# Patient Record
Sex: Female | Born: 1975 | Race: Asian | Hispanic: No | Marital: Married | Smoking: Never smoker
Health system: Southern US, Community
[De-identification: ages and names within clinical notes are randomized; demographics above are authoritative.]

## PROBLEM LIST (undated history)

## (undated) DIAGNOSIS — R112 Nausea with vomiting, unspecified: Secondary | ICD-10-CM

## (undated) DIAGNOSIS — Z9889 Other specified postprocedural states: Secondary | ICD-10-CM

## (undated) DIAGNOSIS — C50919 Malignant neoplasm of unspecified site of unspecified female breast: Secondary | ICD-10-CM

## (undated) HISTORY — PX: MASTECTOMY: SHX3

---

## 2018-11-09 ENCOUNTER — Encounter: Payer: Self-pay | Admitting: Hematology and Oncology

## 2018-11-09 ENCOUNTER — Telehealth: Payer: Self-pay | Admitting: Hematology and Oncology

## 2018-11-09 NOTE — Telephone Encounter (Signed)
New referral received from Dr. Newton Pigg for invasive ductal carcinoma. Pt was [place] in another country. Dr. Ulanda Edison was only able to provide the pt's path report, no mammograms provided. I spoke to the pt's mother in law and scheduled the pt to see Dr. Lindi Adie on 1/8 at 345pm. Aware to arrive 30 minutes early. Letter mailed.

## 2018-11-14 NOTE — Progress Notes (Signed)
[facility] CONSULT NOTE  Patient Care Team: Newton Pigg, MD as PCP - General (Obstetrics and Gynecology)  CHIEF COMPLAINTS/PURPOSE OF CONSULTATION:  Newly diagnosed breast cancer  HISTORY OF PRESENTING ILLNESS:  Ariana Luna 43 y.o. female is here because of recent diagnosis of invasive ductal carcinoma of the right breast. The patient was [place] and was referred by Dr. Ulanda Edison. Pathology from 09/18/18 from a right mastectomy shows the 5.3cm cancer to be stage 2, grade 2, HER2 negative, ER 60%, PR negative, Ki67 90%.   She presents to the clinic today with her mother and father in law, who served as interpreters for the visit. She reports the cancer was palpable 3-4 months ago and she went to the hospital for an exam. A biopsy and right mastectomy was performed and all 18 lymph nodes taken from the surgery were negative for cancer. The patient states that they found a small tumor in her left breast but there is no record of a biopsy so she will get a mammogram. In the past she has taken oral iron, but Dr. Ulanda Edison has checked her iron and it has normalized. She has no children. She says Dr. Ulanda Edison found a tumor in her uterus a few years ago and she had a hysterectomy and has only one ovary. She denies hot flashes. 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 Her surgical scar is healing well.   I reviewed her records extensively and collaborated the history with the patient.  SUMMARY OF ONCOLOGIC HISTORY:   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, estrogen receptor positive ([place])   09/30/2018 Surgery    Right mastectomy with axillary lymph node dissection in [place]: Grade 2 IDC, 5.3 cm, DCIS, margins negative, lymphovascular invasion present, 0/18 lymph nodes, ER 60%, PR 0%, HER-2 negative, Ki-67 90%, T3N0 stage II B    11/15/2018 Cancer Staging    Staging form: Breast, AJCC 8th Edition - Pathologic: Stage IIB  (pT3, pN0, cM0, G2, ER+, PR-, HER2-) - Signed by Nicholas Lose, MD on 11/15/2018    MEDICAL HISTORY:  Denies any prior medical problems SURGICAL HISTORY: Hysterectomy and one-sided oophorectomy Right mastectomy and axillary lymph node dissection  SOCIAL HISTORY: Denies any tobacco alcohol or recreational drug use FAMILY HISTORY: No family history of breast cancer  ALLERGIES:  has no allergies on file.  No known drug allergies  MEDICATIONS: Does not take any medications REVIEW OF SYSTEMS:   Constitutional: Denies fevers, chills or abnormal night sweats Eyes: Denies blurriness of vision, double vision or watery eyes Ears, nose, mouth, throat, and face: Denies mucositis or sore throat Respiratory: Denies cough, dyspnea or wheezes Cardiovascular: Denies palpitation, chest discomfort or lower extremity swelling Gastrointestinal:  Denies nausea, heartburn or change in bowel habits Skin: Denies abnormal skin rashes Lymphatics: Denies new lymphadenopathy or easy bruising Neurological:Denies numbness, tingling or new weaknesses Behavioral/Psych: Mood is stable, no new changes  Breast: Right mastectomy scar is well-healed All other systems were reviewed with the patient and are negative.  PHYSICAL EXAMINATION: ECOG PERFORMANCE STATUS: 1 - Symptomatic but completely ambulatory  Vitals:   11/15/18 1546  BP: 108/73  Pulse: 77  Resp: 16  Temp: 98.4 F (36.9 C)  SpO2: 100%   Filed Weights   11/15/18 1546  Weight: 123 lb 9.6 oz (56.1 kg)    GENERAL:alert, no distress and comfortable SKIN: skin color, texture, turgor are normal, no rashes or significant lesions EYES: normal, conjunctiva are  pink and non-injected, sclera clear OROPHARYNX:no exudate, no erythema and lips, buccal mucosa, and tongue normal  NECK: supple, thyroid normal size, non-tender, without nodularity LYMPH:  no palpable lymphadenopathy in the cervical, axillary or inguinal LUNGS: clear to auscultation and percussion  with normal breathing effort HEART: regular rate & rhythm and no murmurs and no lower extremity edema ABDOMEN:abdomen soft, non-tender and normal bowel sounds Musculoskeletal:no cyanosis of digits and no clubbing  PSYCH: alert & oriented x 3 with fluent speech NEURO: no focal motor/sensory deficits   LABORATORY DATA:  I have reviewed the data as listed No results found for: WBC, HGB, HCT, MCV, PLT No results found for: NA, K, CL, CO2  RADIOGRAPHIC STUDIES: I have personally reviewed the radiological reports and agreed with the findings in the report.  ASSESSMENT AND PLAN:  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, estrogen receptor positive ([place]) 09/30/2018:Right mastectomy with axillary lymph node dissection in [place]: Grade 2 IDC, 5.3 cm, DCIS, margins negative, lymphovascular invasion present, 0/18 lymph nodes, ER 60%, PR 0%, HER-2 negative, Ki-67 90%, T3N0 stage II B  Pathology and radiology counseling: Discussed with the patient, the details of pathology including the type of breast cancer,the clinical staging, the significance of ER, PR and HER-2/neu receptors and the implications for treatment. After reviewing the pathology in detail, we proceeded to discuss the different treatment options between surgery, radiation, chemotherapy, antiestrogen therapies.  Treatment plan: 1.  Oncotype DX testing to determine if she would benefit from chemo 2.  Adjuvant radiation therapy because of the tumor size being large 4.  Followed by adjuvant antiestrogen therapy with tamoxifen 20 mg daily x10 years  Left breast abnormality: This was noted in [place] but she did not have biopsy.  I would like to get another mammogram and ultrasound for further evaluate this.  Genetic counseling will be obtained. Return to clinic based upon Oncotype test results or at the end of radiation.  We will call the family with the results of Oncotype test. Patient's mother  acted as interpreter for further discussion.   All questions were answered. The patient knows to call the clinic with any problems, questions or concerns.   Nicholas Lose, MD 11/15/2018   I, Cloyde Reams Dorshimer, am acting as scribe for Nicholas Lose, MD.  I have reviewed the above documentation for accuracy and completeness, and I agree with the above.

## 2018-11-15 ENCOUNTER — Inpatient Hospital Stay: Payer: BLUE CROSS/BLUE SHIELD | Attending: Hematology and Oncology | Admitting: Hematology and Oncology

## 2018-11-15 VITALS — BP 108/73 | HR 77 | Temp 98.4°F | Resp 16 | Ht 61.0 in | Wt 123.6 lb

## 2018-11-15 DIAGNOSIS — Z9221 Personal history of antineoplastic chemotherapy: Secondary | ICD-10-CM | POA: Diagnosis not present

## 2018-11-15 DIAGNOSIS — Z923 Personal history of irradiation: Secondary | ICD-10-CM | POA: Insufficient documentation

## 2018-11-15 DIAGNOSIS — Z79810 Long term (current) use of selective estrogen receptor modulators (SERMs): Secondary | ICD-10-CM | POA: Insufficient documentation

## 2018-11-15 DIAGNOSIS — Z170 Estrogen receptor positive status [ER+]: Secondary | ICD-10-CM | POA: Insufficient documentation

## 2018-11-15 DIAGNOSIS — Z9011 Acquired absence of right breast and nipple: Secondary | ICD-10-CM | POA: Diagnosis not present

## 2018-11-15 DIAGNOSIS — C50011 Malignant neoplasm of nipple and areola, right female breast: Secondary | ICD-10-CM | POA: Insufficient documentation

## 2018-11-15 NOTE — Assessment & Plan Note (Signed)
09/30/2018:Right mastectomy with axillary lymph node dissection in [place]: Grade 2 IDC, 5.3 cm, DCIS, margins negative, lymphovascular invasion present, 0/18 lymph nodes, ER 60%, PR 0%, HER-2 negative, Ki-67 90%, T3N0 stage II B  Pathology and radiology counseling: Discussed with the patient, the details of pathology including the type of breast cancer,the clinical staging, the significance of ER, PR and HER-2/neu receptors and the implications for treatment. After reviewing the pathology in detail, we proceeded to discuss the different treatment options between surgery, radiation, chemotherapy, antiestrogen therapies.  Treatment plan: 1.  Oncotype DX testing to determine if she would benefit from chemo 2.  Adjuvant radiation therapy because of the tumor size being large 4.  Followed by adjuvant antiestrogen therapy with tamoxifen 20 mg daily x10 years  Left breast abnormality: This was noted in [place] but she did not have biopsy.  I would like to get another mammogram and ultrasound for further evaluate this.  Genetic counseling will be obtained. Return to clinic based upon Oncotype test results or at the end of radiation.  We will call the family with the results of Oncotype test. Patient's mother acted as interpreter for further discussion.

## 2018-11-16 ENCOUNTER — Other Ambulatory Visit: Payer: Self-pay | Admitting: Hematology and Oncology

## 2018-11-20 ENCOUNTER — Telehealth: Payer: Self-pay | Admitting: *Deleted

## 2018-11-20 NOTE — Telephone Encounter (Signed)
Received order for oncotype testing per Dr. Lindi Adie. Requisition faxed to pathology. Received by Maudie Mercury

## 2018-11-28 ENCOUNTER — Other Ambulatory Visit: Payer: Self-pay | Admitting: Hematology and Oncology

## 2018-11-28 ENCOUNTER — Ambulatory Visit
Admission: RE | Admit: 2018-11-28 | Discharge: 2018-11-28 | Disposition: A | Discharge status: Home / Self Care | Payer: BLUE CROSS/BLUE SHIELD | Source: Ambulatory Visit | Attending: Hematology and Oncology | Admitting: Hematology and Oncology

## 2018-11-28 DIAGNOSIS — C50011 Malignant neoplasm of nipple and areola, right female breast: Secondary | ICD-10-CM

## 2018-11-28 DIAGNOSIS — Z170 Estrogen receptor positive status [ER+]: Principal | ICD-10-CM

## 2018-11-28 DIAGNOSIS — N6320 Unspecified lump in the left breast, unspecified quadrant: Secondary | ICD-10-CM

## 2018-11-28 IMAGING — MG DIGITAL DIAGNOSTIC UNILATERAL LEFT MAMMOGRAM WITH TOMO AND CAD
4 series · 4 of 12 positions shown · non-contrast
Comparison: Previous exam(s).

CLINICAL DATA: Patient with recent diagnosis of right breast cancer
status post mastectomy. Additionally, patient had multiple nodules
within the left breast, for short-term follow-up evaluation.
Patient's initial evaluation and surgery was done outside of the
country.

EXAM:
DIGITAL DIAGNOSTIC LEFT MAMMOGRAM WITH CAD AND TOMO
ULTRASOUND LEFT BREAST

[L CC synth-2D]
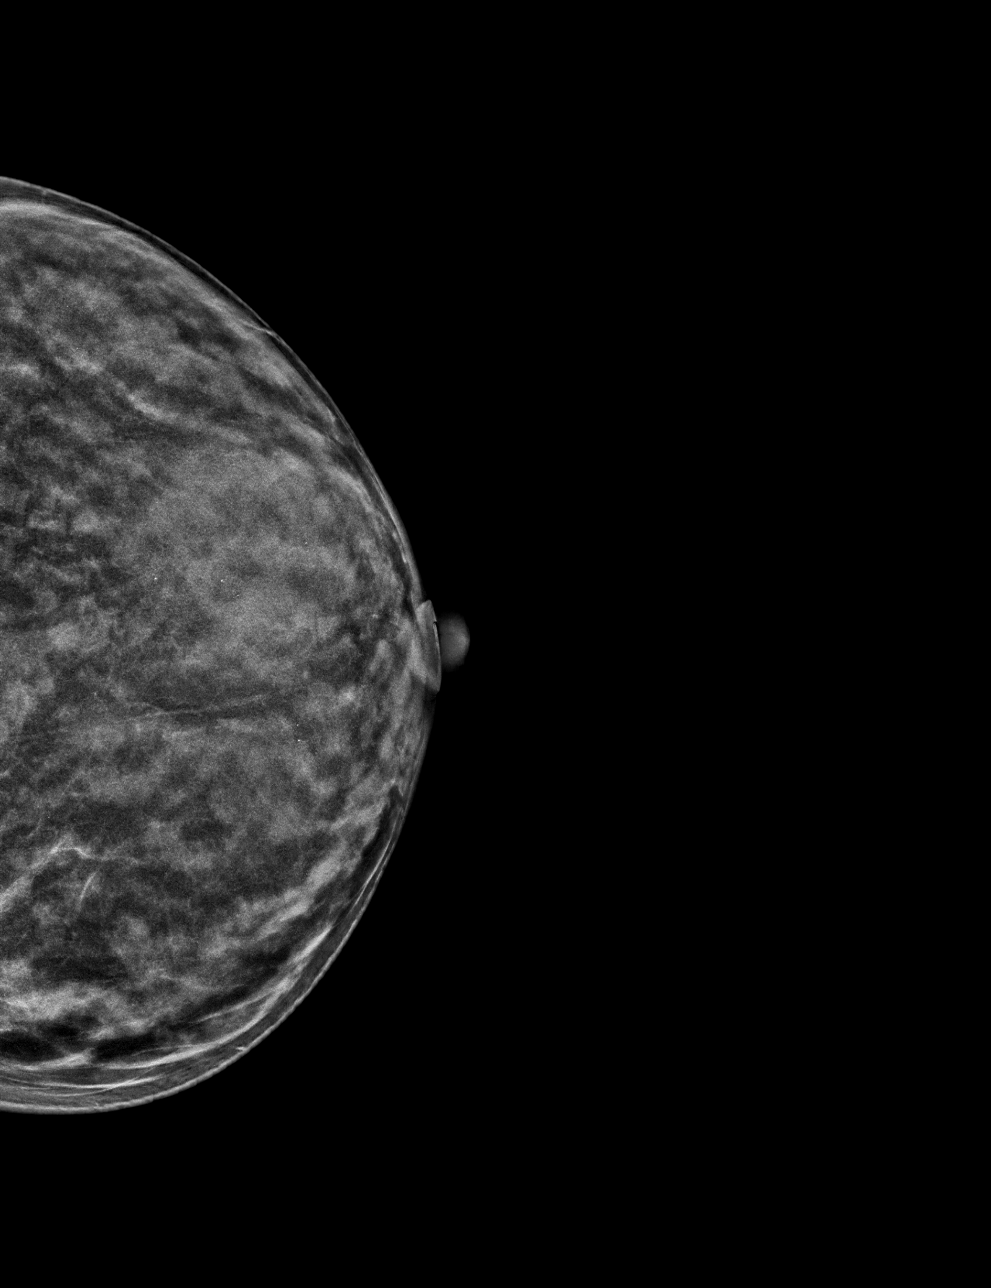

[L MLO synth-2D]
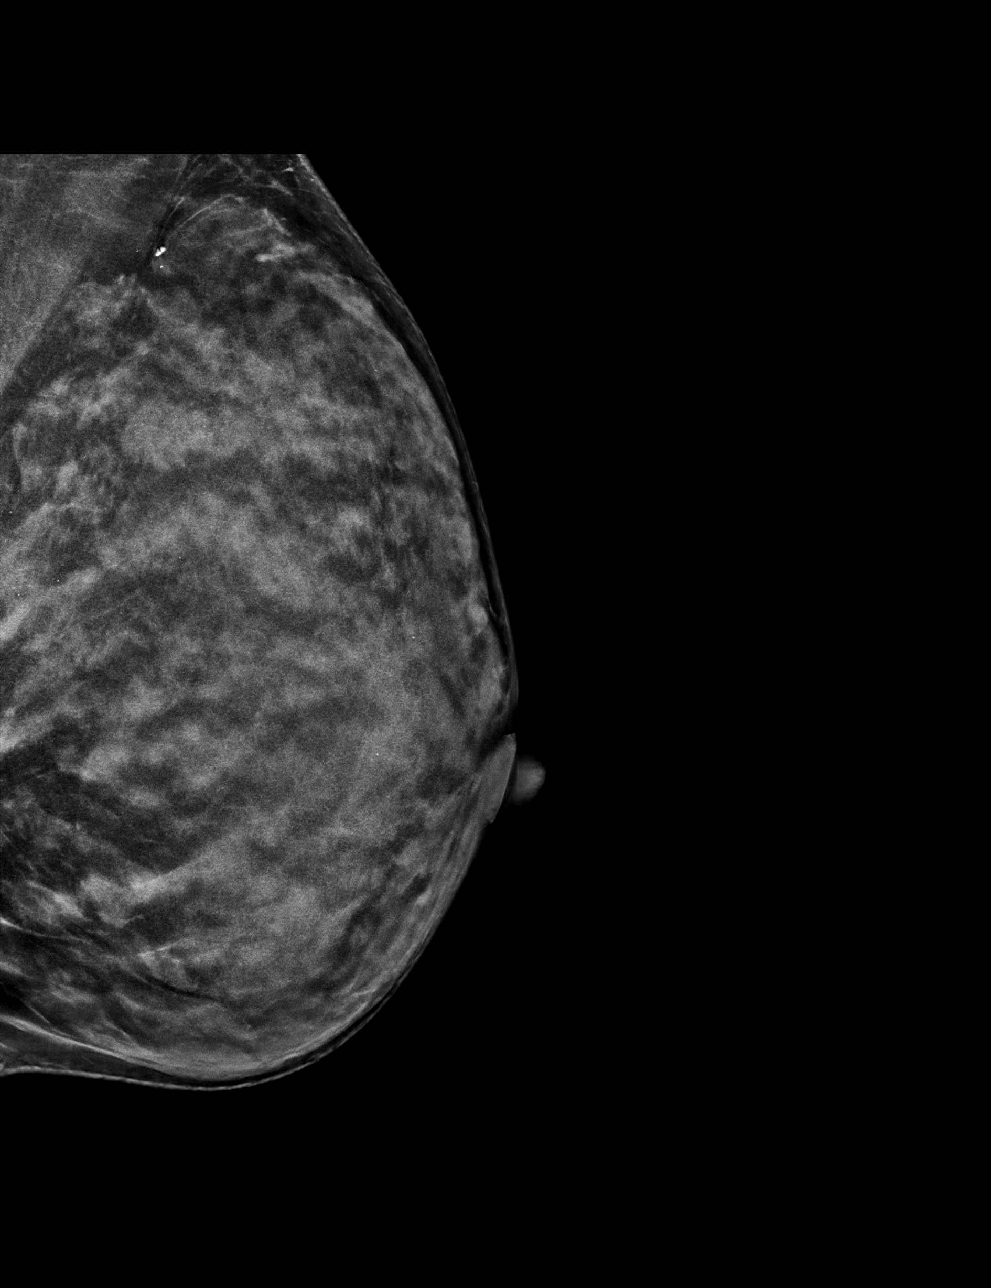

[L CC tomo · tomo slice 25/50.0]
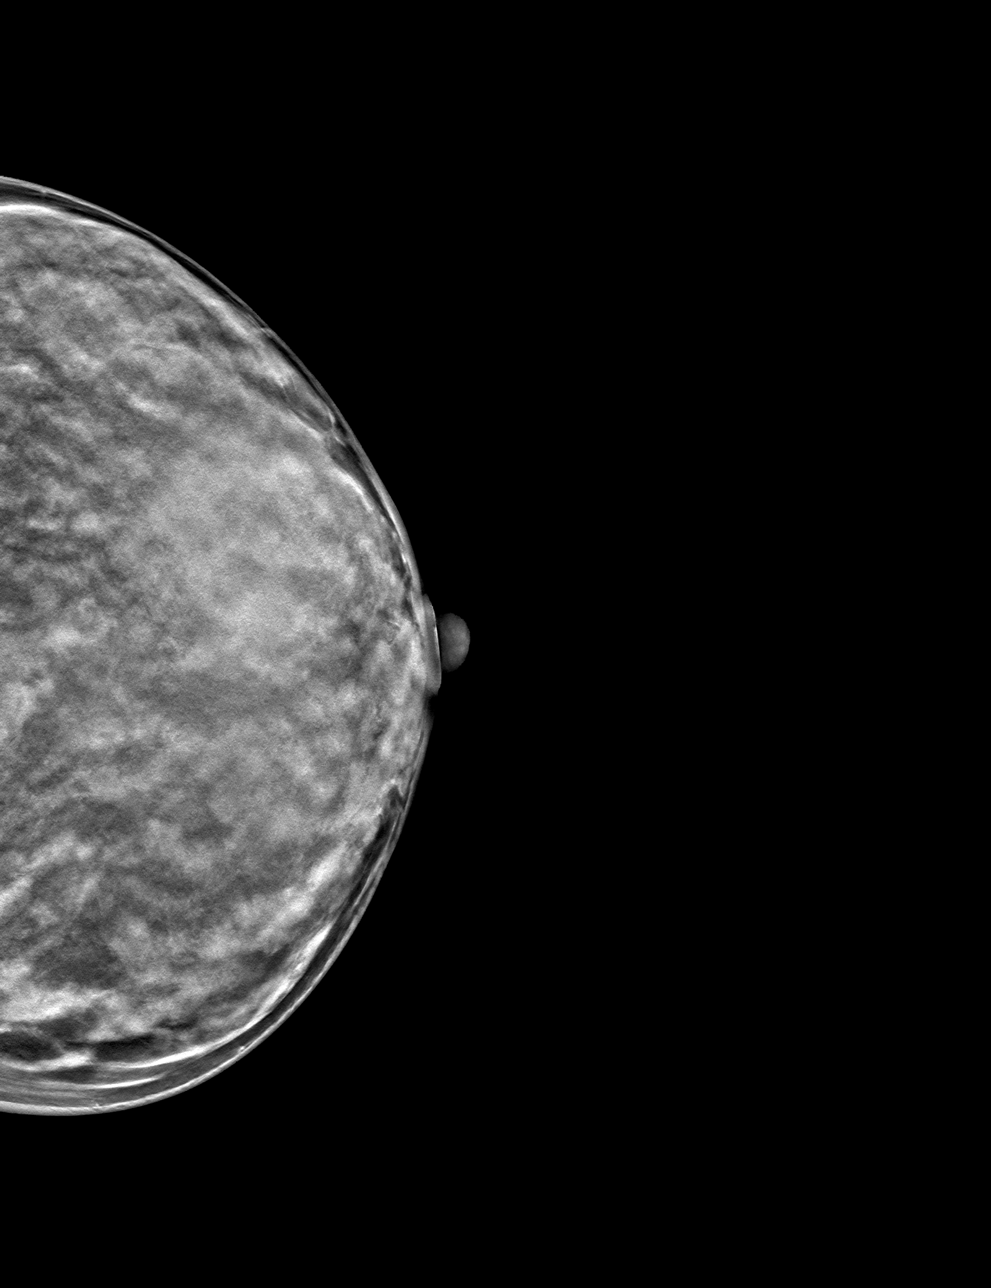

[L MLO tomo · tomo slice 28/55.0]
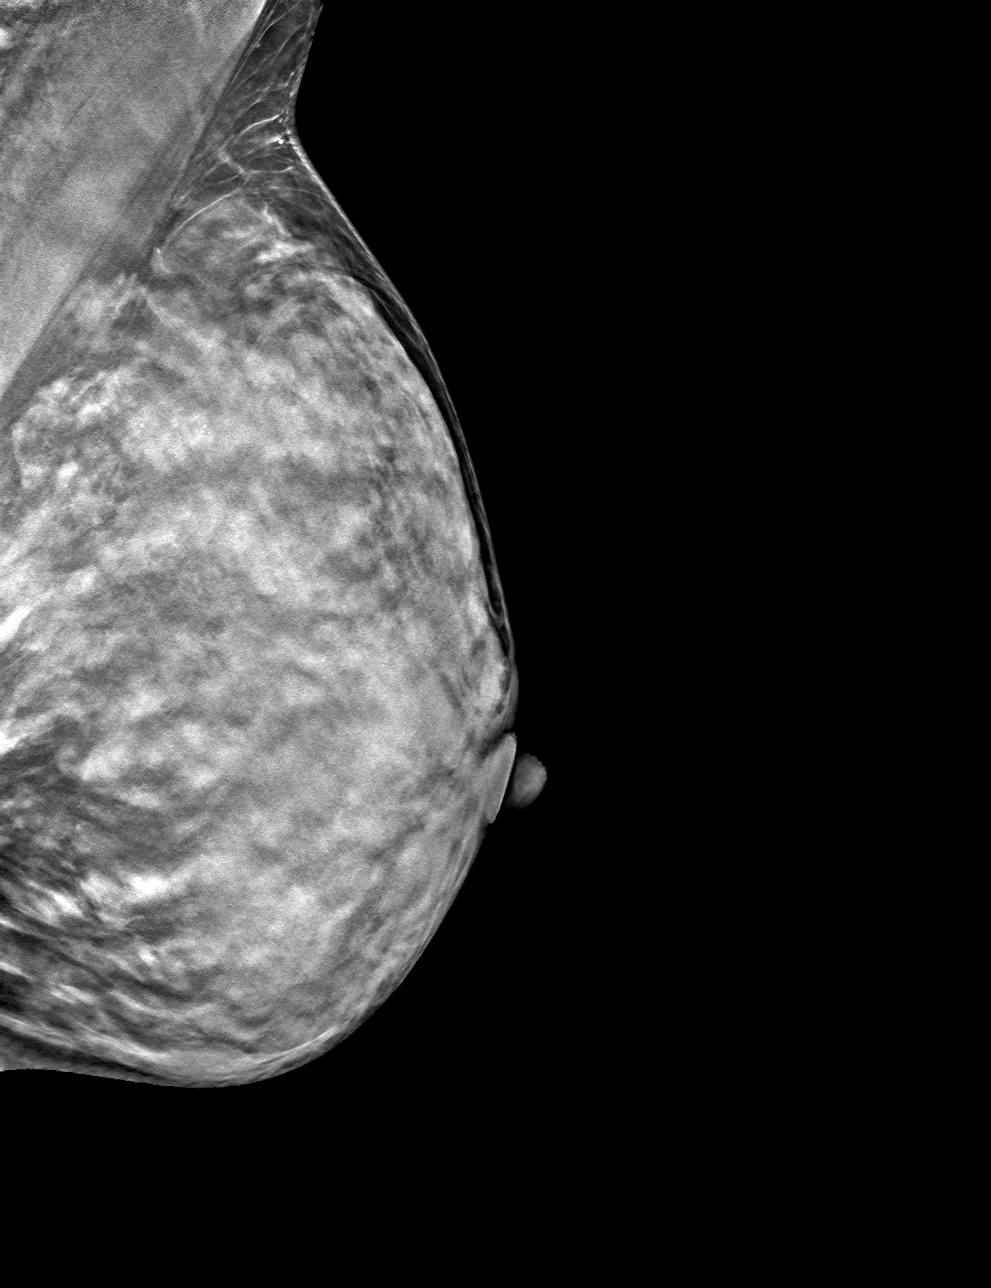

[4 of 12 positions shown; findings below may reference images not displayed]

ACR Breast Density Category d: The breast tissue is extremely dense,
which lowers the sensitivity of mammography.
FINDINGS: No concerning masses, calcifications or distortion identified within
the left breast.

Mammographic images were processed with CAD.

On physical exam, I palpate no discrete mass within the upper inner
left breast.

Targeted ultrasound is performed, showing a 0.9 x 0.5 x 0.7 cm
lobular hypoechoic mass left breast 10 o'clock position 7 cm from
nipple, slightly increased in size from recent prior ultrasound
where it measured 0.7 x 0.7 x 0.4 cm.

Within the left breast 10 o'clock position 2 cm from nipple there is
a 0.7 x 0.9 x 0.5 cm cluster of cysts.

Within the left breast 3 o'clock position 6 cm from nipple there is
a 1.1 x 0.3 x 0.8 cm oval circumscribed hypoechoic mass.
IMPRESSION: 1. Slight interval increase in size of indeterminate left breast
mass 10 o'clock position 7 cm from the nipple.
2. Probably benign left breast mass 10 o'clock position 2 cm from
nipple, favored to represent a cluster of cysts.
3. Prior benign left breast mass 3 o'clock position, favored to
represent a fibroadenoma.

RECOMMENDATION:
1. Ultrasound-guided core needle biopsy left breast mass 10 o'clock
position 7 cm from nipple.
2. If this demonstrates benign pathology, recommend follow-up left
breast mammogram and ultrasound in 6 months to ensure stability of
additional probably benign masses 10 o'clock position and 3 o'clock
position.
3. Consider breast MRI given personal history of breast cancer.

I have discussed the findings and recommendations with the patient.
Results were also provided in writing at the conclusion of the
visit. If applicable, a reminder letter will be sent to the patient
regarding the next appointment.

BI-RADS CATEGORY  4: Suspicious.

## 2018-11-28 IMAGING — US ULTRASOUND LEFT BREAST LIMITED
1 series · 13 of 16 positions shown · non-contrast
Comparison: Previous exam(s).

CLINICAL DATA: Patient with recent diagnosis of right breast cancer
status post mastectomy. Additionally, patient had multiple nodules
within the left breast, for short-term follow-up evaluation.
Patient's initial evaluation and surgery was done outside of the
country.

EXAM:
DIGITAL DIAGNOSTIC LEFT MAMMOGRAM WITH CAD AND TOMO
ULTRASOUND LEFT BREAST

[Series 1: ultrasound left breast limited · 0.05mm/px · 13 of 16 slices shown]
[im 1/16]
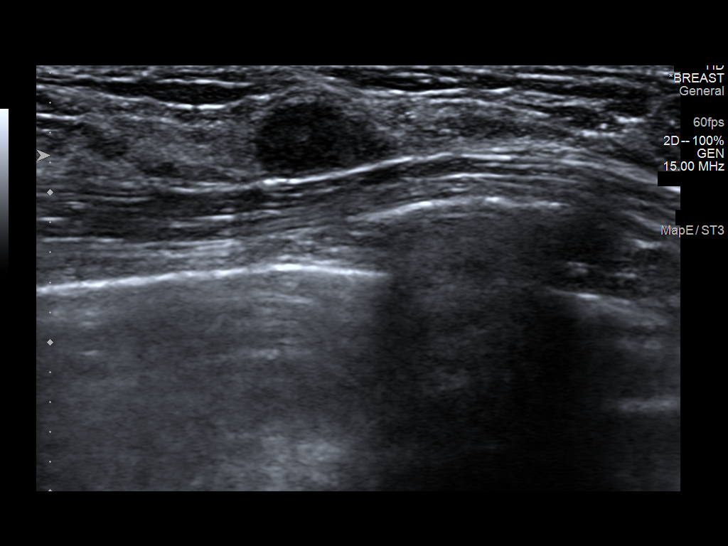
[im 2/16]
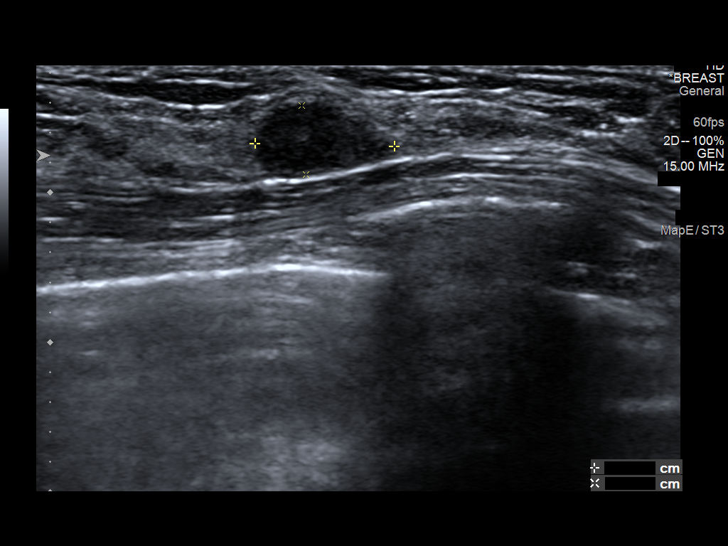
[im 4/16]
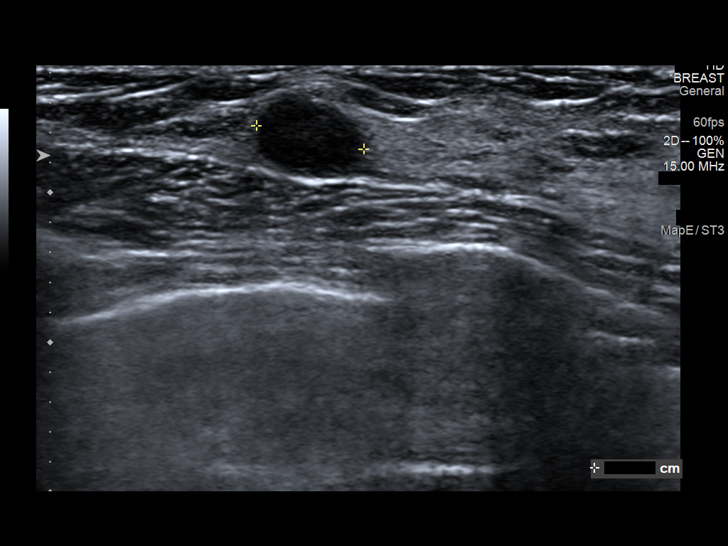
[im 5/16]
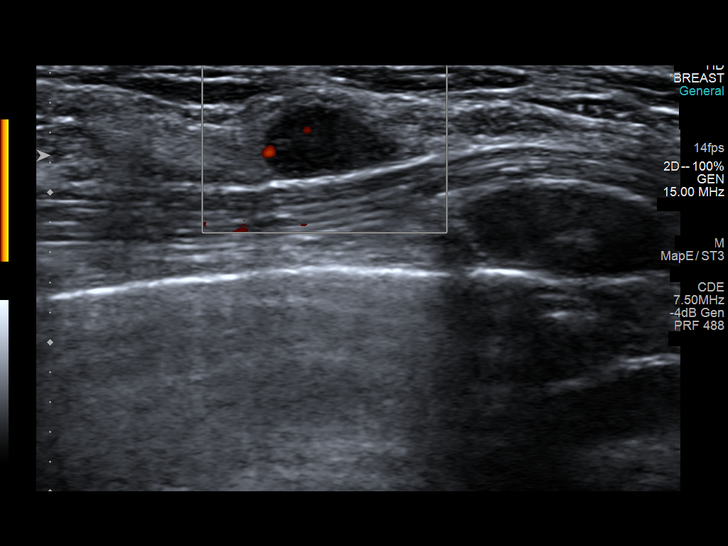
[im 6/16]
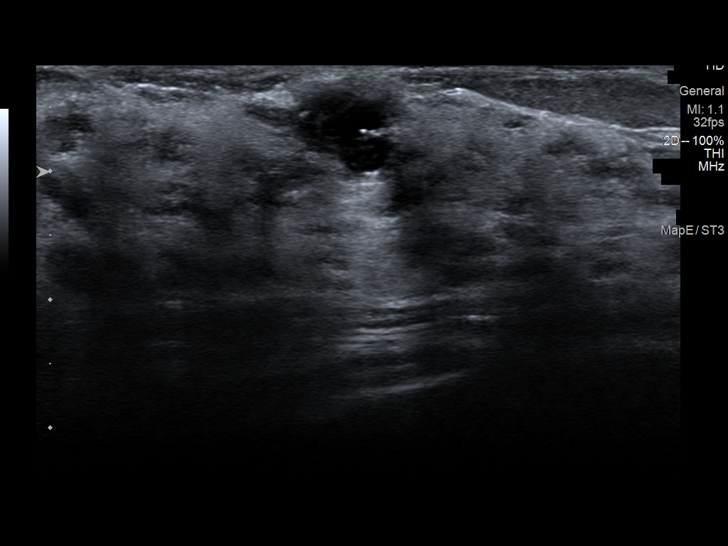
[im 7/16]
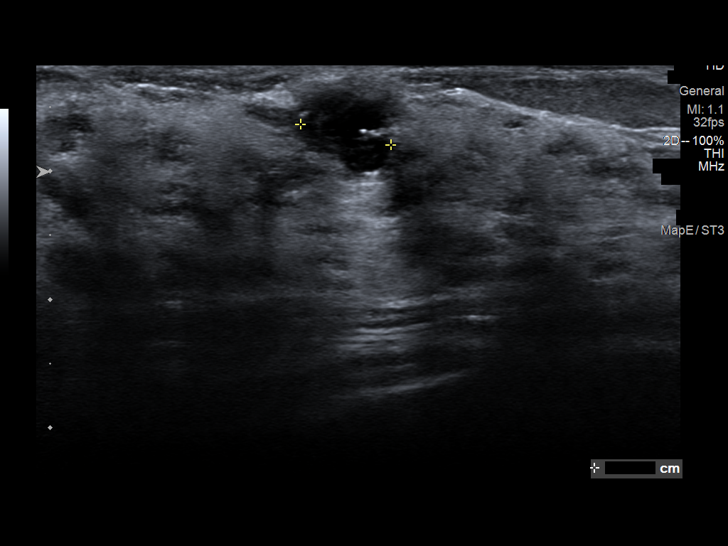
[im 9/16]
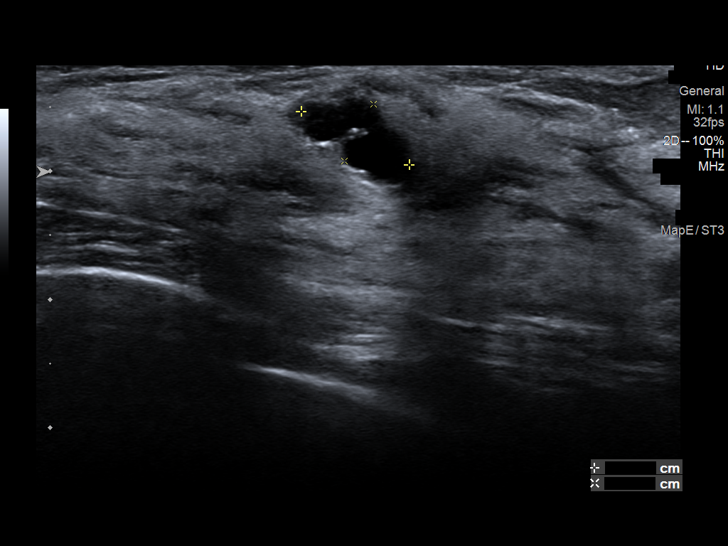
[im 10/16]
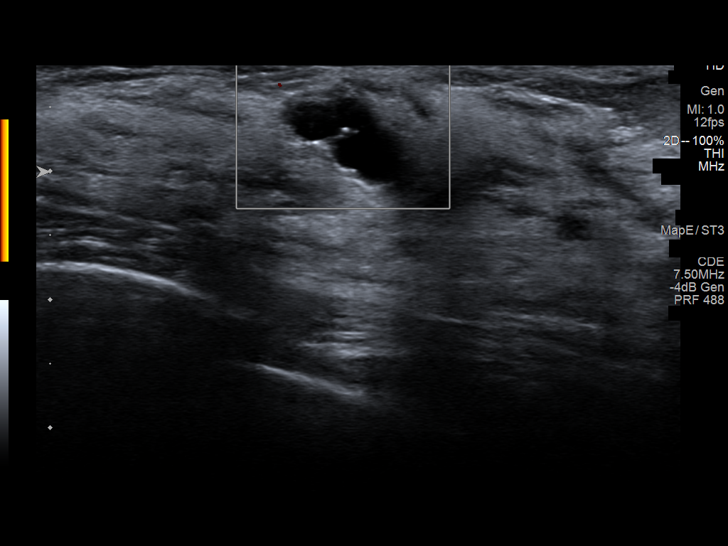
[im 11/16]
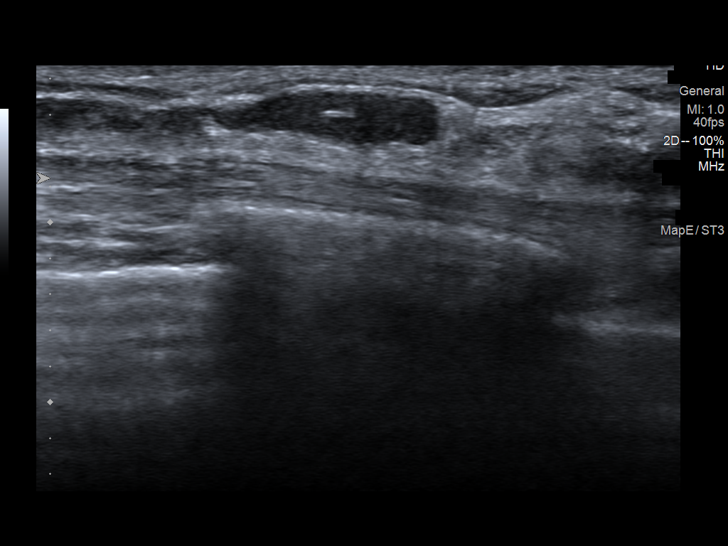
[im 12/16]
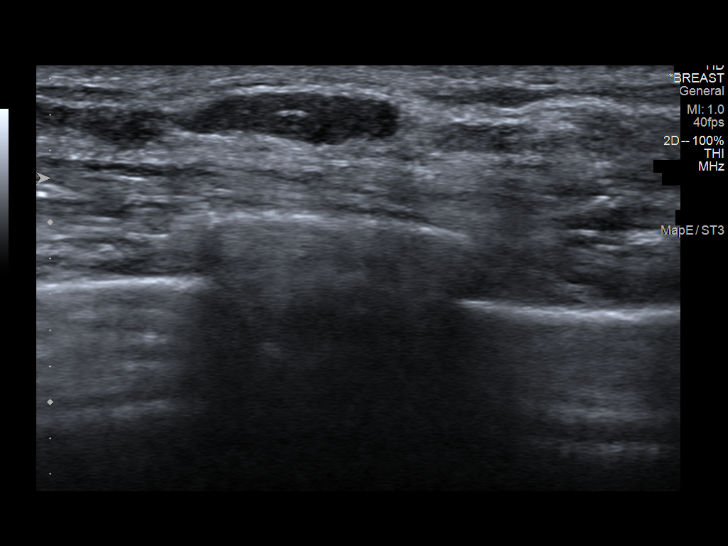
[im 13/16]
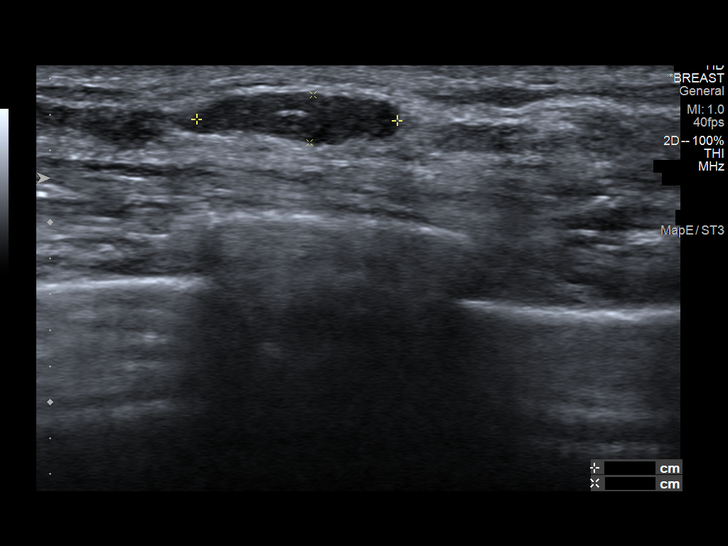
[im 15/16]
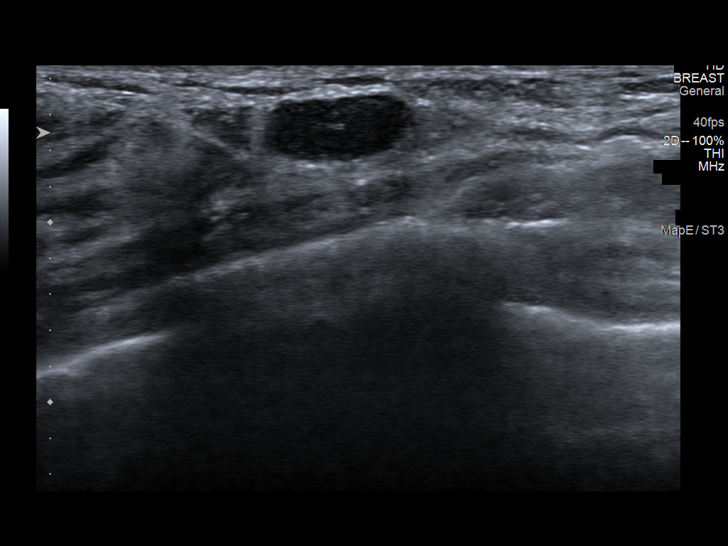
[im 16/16]
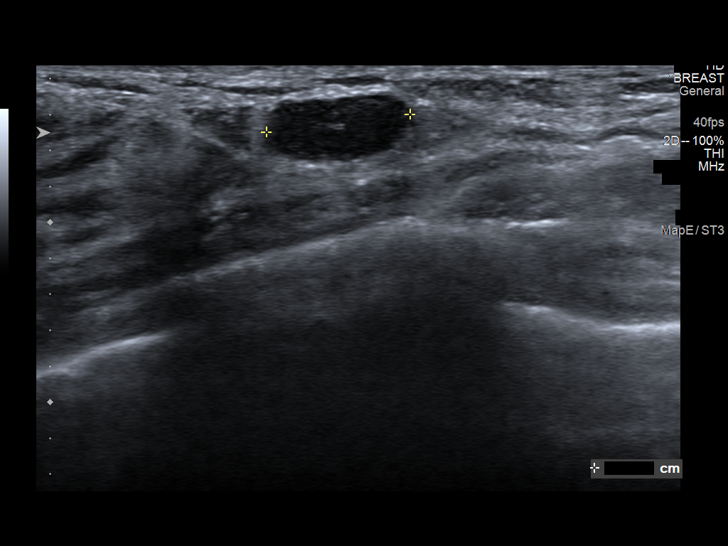

[13 of 16 positions shown; findings below may reference images not displayed]

ACR Breast Density Category d: The breast tissue is extremely dense,
which lowers the sensitivity of mammography.
FINDINGS: No concerning masses, calcifications or distortion identified within
the left breast.

Mammographic images were processed with CAD.

On physical exam, I palpate no discrete mass within the upper inner
left breast.

Targeted ultrasound is performed, showing a 0.9 x 0.5 x 0.7 cm
lobular hypoechoic mass left breast 10 o'clock position 7 cm from
nipple, slightly increased in size from recent prior ultrasound
where it measured 0.7 x 0.7 x 0.4 cm.

Within the left breast 10 o'clock position 2 cm from nipple there is
a 0.7 x 0.9 x 0.5 cm cluster of cysts.

Within the left breast 3 o'clock position 6 cm from nipple there is
a 1.1 x 0.3 x 0.8 cm oval circumscribed hypoechoic mass.
IMPRESSION: 1. Slight interval increase in size of indeterminate left breast
mass 10 o'clock position 7 cm from the nipple.
2. Probably benign left breast mass 10 o'clock position 2 cm from
nipple, favored to represent a cluster of cysts.
3. Prior benign left breast mass 3 o'clock position, favored to
represent a fibroadenoma.

RECOMMENDATION:
1. Ultrasound-guided core needle biopsy left breast mass 10 o'clock
position 7 cm from nipple.
2. If this demonstrates benign pathology, recommend follow-up left
breast mammogram and ultrasound in 6 months to ensure stability of
additional probably benign masses 10 o'clock position and 3 o'clock
position.
3. Consider breast MRI given personal history of breast cancer.

I have discussed the findings and recommendations with the patient.
Results were also provided in writing at the conclusion of the
visit. If applicable, a reminder letter will be sent to the patient
regarding the next appointment.

BI-RADS CATEGORY  4: Suspicious.

## 2018-11-29 ENCOUNTER — Inpatient Hospital Stay (HOSPITAL_BASED_OUTPATIENT_CLINIC_OR_DEPARTMENT_OTHER): Payer: BLUE CROSS/BLUE SHIELD | Admitting: Genetic Counselor

## 2018-11-29 ENCOUNTER — Inpatient Hospital Stay: Payer: BLUE CROSS/BLUE SHIELD

## 2018-11-29 ENCOUNTER — Encounter: Payer: Self-pay | Admitting: Genetic Counselor

## 2018-11-29 DIAGNOSIS — Z170 Estrogen receptor positive status [ER+]: Secondary | ICD-10-CM

## 2018-11-29 DIAGNOSIS — C50011 Malignant neoplasm of nipple and areola, right female breast: Secondary | ICD-10-CM | POA: Diagnosis not present

## 2018-11-29 NOTE — Progress Notes (Signed)
REFERRING PROVIDER: Nicholas Lose, MD [place], [place] [address]  PRIMARY PROVIDER:  Newton Pigg, MD  PRIMARY REASON FOR VISIT:  1. 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, estrogen receptor positive ([place])      HISTORY OF PRESENT ILLNESS:   Ariana Luna, a 43 y.o. female, was seen for a [place] cancer genetics consultation at the request of Dr. Lindi Adie due to a personal history of cancer. Telephonic Interpreter [phone], as well as the patient's mother-in-law, were available to translate.  Ariana Luna presents to clinic today to discuss the possibility of a hereditary predisposition to cancer, genetic testing, and to further clarify her future cancer risks, as well as potential cancer risks for family members.   In November 2019, at the age of 73, Ariana Luna was diagnosed with cancer of the left breast. This was treated with unilateral mastectomy, which was performed in her hometown in [place].     CANCER HISTORY:    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, estrogen receptor positive ([place])   09/30/2018 Surgery    Right mastectomy with axillary lymph node dissection in [place]: Grade 2 IDC, 5.3 cm, DCIS, margins negative, lymphovascular invasion present, 0/18 lymph nodes, ER 60%, PR 0%, HER-2 negative, Ki-67 90%, T3N0 stage II B    11/15/2018 Cancer Staging    Staging form: Breast, AJCC 8th Edition - Pathologic: Stage IIB (pT3, pN0, cM0, G2, ER+, PR-, HER2-) - Signed by Nicholas Lose, MD on 11/15/2018      HORMONAL RISK FACTORS:  Menarche was at age 31.  First live birth at age N/A.  OCP use for approximately 0 years.  Ovaries intact: one ovary is intact, the other was removed.  Hysterectomy: yes.  Menopausal status: premenopausal.  HRT use: 0 years. Colonoscopy: no; not examined. Mammogram within the last year: yes. Number of breast biopsies: patient has had a  unilateral mastectomy Up to date with pelvic exams:  yes. Any excessive radiation exposure in the past:  no  No past medical history on file.   Social History   Socioeconomic History  . Marital status: Married    Spouse name: Not on file  . Number of children: Not on file  . Years of education: Not on file  . Highest education level: Not on file  Occupational History  . Not on file  Social Needs  . Financial resource strain: Not on file  . Food insecurity:    Worry: Not on file    Inability: Not on file  . Transportation needs:    Medical: Not on file    Non-medical: Not on file  Tobacco Use  . Smoking status: Not on file  Substance and Sexual Activity  . Alcohol use: Not on file  . Drug use: Not on file  . Sexual activity: Not on file  Lifestyle  . Physical activity:    Days per week: Not on file    Minutes per session: Not on file  . Stress: Not on file  Relationships  . Social connections:    Talks on phone: Not on file    Gets together: Not on file    Attends religious service: Not on file    Active member of club or organization: Not on file    Attends meetings of clubs or organizations: Not on file    Relationship status: Not on file  Other Topics Concern  . Not on file  Social History Narrative  . Not on file     FAMILY HISTORY:  We obtained a detailed, 4-generation family history.  Significant diagnoses are listed below: No family history on file.   The patient reports that there is no family history of cancer.  [place] and went to town to go to school.  She is not close with extended family members.  The patient does not have children.  She has a brother and sister who are cancer free.  Her father is deceased from unknown reasons and her mother is living.  The patient's mother was one of 5 sisters, she had one brother.  There is no reported family history of cancer.  The patient's father had several siblings.  The  patient was not close with the family, but is not aware of cancer on the paternal side of the family.  Ms. Schlink is unaware of previous family history of genetic testing for hereditary cancer risks. Patient's maternal ancestors are of [place] descent, and paternal ancestors are of [place] descent. There is no reported Ashkenazi Jewish ancestry. There is no known consanguinity.  GENETIC COUNSELING ASSESSMENT: Ariana Luna is a 43 y.o. female with a personal history of early onset breast cancer which is somewhat suggestive of a hereditary cancer syndrome and predisposition to cancer. We, therefore, discussed and recommended the following at today's visit.   DISCUSSION: We discussed that about 5-10% of breast cancer is hereditary with most cases due to BRCA mutations.  There are other genes that can increase the risk for breast cancer specifically, or cancer in general.  Based on her family history, the likelihood that we will find something is low, however, based on her personal history and age, we should look.  If we found a hereditary mutation it would have consequences for how we would follow her in the future and the risk for her family members.  We reviewed the characteristics, features and inheritance patterns of hereditary cancer syndromes. We also discussed genetic testing, including the appropriate family members to test, the process of testing, insurance coverage and turn-around-time for results. We discussed the implications of a negative, positive and/or variant of uncertain significant result. We recommended Ms. Legler pursue genetic testing for the common hereditary cancer gene panel. The Hereditary Gene Panel offered by [facility] includes sequencing and/or deletion duplication testing of the following 47 genes: APC, ATM, AXIN2, BARD1, BMPR1A, BRCA1, BRCA2, BRIP1, CDH1, CDK4, CDKN2A (p14ARF), CDKN2A (p16INK4a), CHEK2, CTNNA1, DICER1, EPCAM (Deletion/duplication testing only), GREM1  (promoter region deletion/duplication testing only), KIT, MEN1, MLH1, MSH2, MSH3, MSH6, MUTYH, NBN, NF1, NHTL1, PALB2, PDGFRA, PMS2, POLD1, POLE, PTEN, RAD50, RAD51C, RAD51D, SDHB, SDHC, SDHD, SMAD4, SMARCA4. STK11, TP53, TSC1, TSC2, and VHL.  The following genes were evaluated for sequence changes only: SDHA and HOXB13 c.251G>A variant only.   Based on Ms. Wieck's family history of cancer, she meets medical criteria for genetic testing. Despite that she meets criteria, she may still have an out of pocket cost. We discussed that if her out of pocket cost for testing is over $100, the laboratory will call and confirm whether she wants to proceed with testing.  If the out of pocket cost of testing is less than $100 she will be billed by the genetic testing laboratory.   PLAN: After considering the risks, benefits, and limitations, Ms. Miracle  provided informed consent to pursue genetic testing and the blood sample was sent to [facility] for analysis of the common hereditary  cancer panel. Results should be available within approximately 2-3 weeks' time, at which point they will be disclosed by telephone to Ms. Hawker, as will any additional recommendations warranted by these results. Ms. Gero will receive a summary of her genetic counseling visit and a copy of her results once available. This information will also be available in Epic. We encouraged Ms. Welcome to remain in contact with cancer genetics annually so that we can continuously update the family history and inform her of any changes in cancer genetics and testing that may be of benefit for her family. Ms. Apperson questions were answered to her satisfaction today. Our contact information was provided should additional questions or concerns arise.  Lastly, we encouraged Ms. Rowe to remain in contact with cancer genetics annually so that we can continuously update the family history and inform her of any changes in  cancer genetics and testing that may be of benefit for this family.   Ms.  Wernli questions were answered to her satisfaction today. Our contact information was provided should additional questions or concerns arise. Thank you for the referral and allowing [place] to share in the care of your patient.   Karen P. Florene Glen, [place], [facility] Certified Genetic Counselor [email or web address] phone: [phone]  The patient was seen for a total of 55 minutes in face-to-face genetic counseling.  This patient was discussed with Drs. Magrinat, Lindi Adie and/or Burr Medico who agrees with the above.    _______________________________________________________________________ For Office Staff:  Number of people involved in session: 2 Was an Intern/ student involved with case: no

## 2018-11-30 ENCOUNTER — Encounter (HOSPITAL_COMMUNITY): Payer: Self-pay | Admitting: Hematology and Oncology

## 2018-11-30 ENCOUNTER — Telehealth: Payer: Self-pay | Admitting: Hematology and Oncology

## 2018-11-30 NOTE — Telephone Encounter (Signed)
Scheduled appt per 1/23 sch message - pt is aware of appt date and time  

## 2018-12-01 ENCOUNTER — Ambulatory Visit
Admission: RE | Admit: 2018-12-01 | Discharge: 2018-12-01 | Disposition: A | Discharge status: Home / Self Care | Payer: BLUE CROSS/BLUE SHIELD | Source: Ambulatory Visit | Attending: Hematology and Oncology | Admitting: Hematology and Oncology

## 2018-12-01 DIAGNOSIS — N6320 Unspecified lump in the left breast, unspecified quadrant: Secondary | ICD-10-CM

## 2018-12-01 DIAGNOSIS — C50011 Malignant neoplasm of nipple and areola, right female breast: Secondary | ICD-10-CM

## 2018-12-01 DIAGNOSIS — Z170 Estrogen receptor positive status [ER+]: Principal | ICD-10-CM

## 2018-12-01 IMAGING — MG MM BREAST LOCALIZATION CLIP
5 series · 5 of 5 positions shown · non-contrast
Comparison: Previous exam(s).

CLINICAL DATA: Patient is post ultrasound-guided core needle biopsy
of a 9 mm indeterminate mass over the 10 o'clock position of the
left breast 7 cm from the nipple.

EXAM:
DIAGNOSTIC left MAMMOGRAM POST ultrasound BIOPSY

[L ML (1 of 2)]
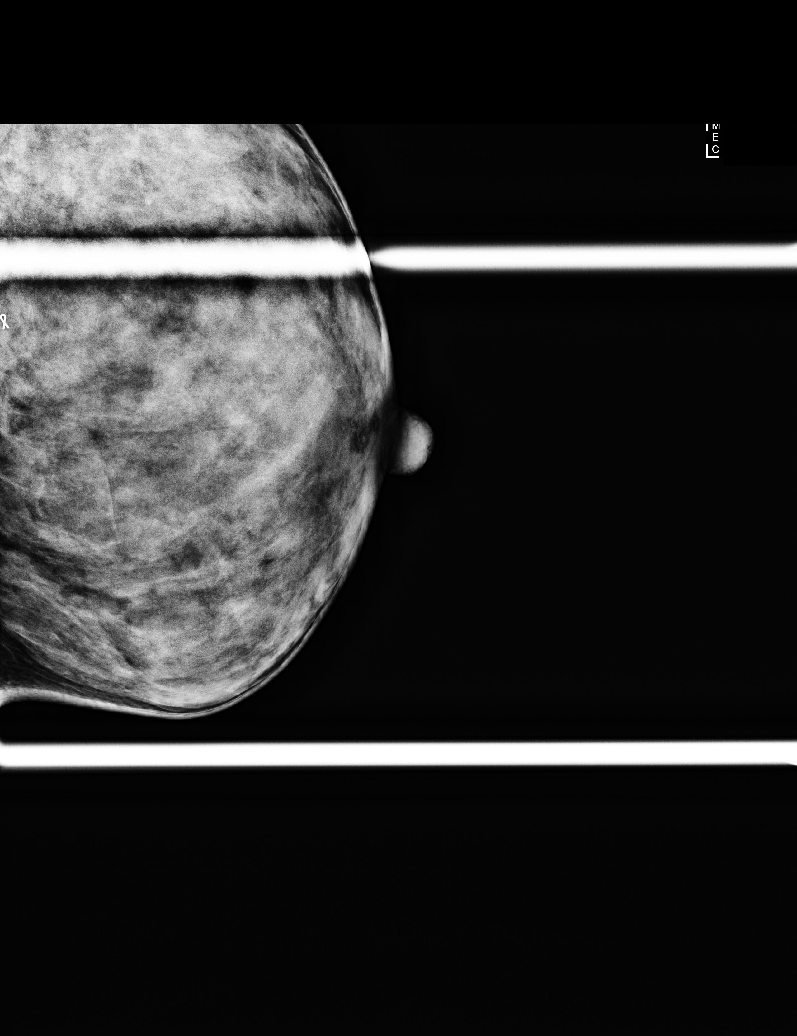

[L ML (2 of 2)]
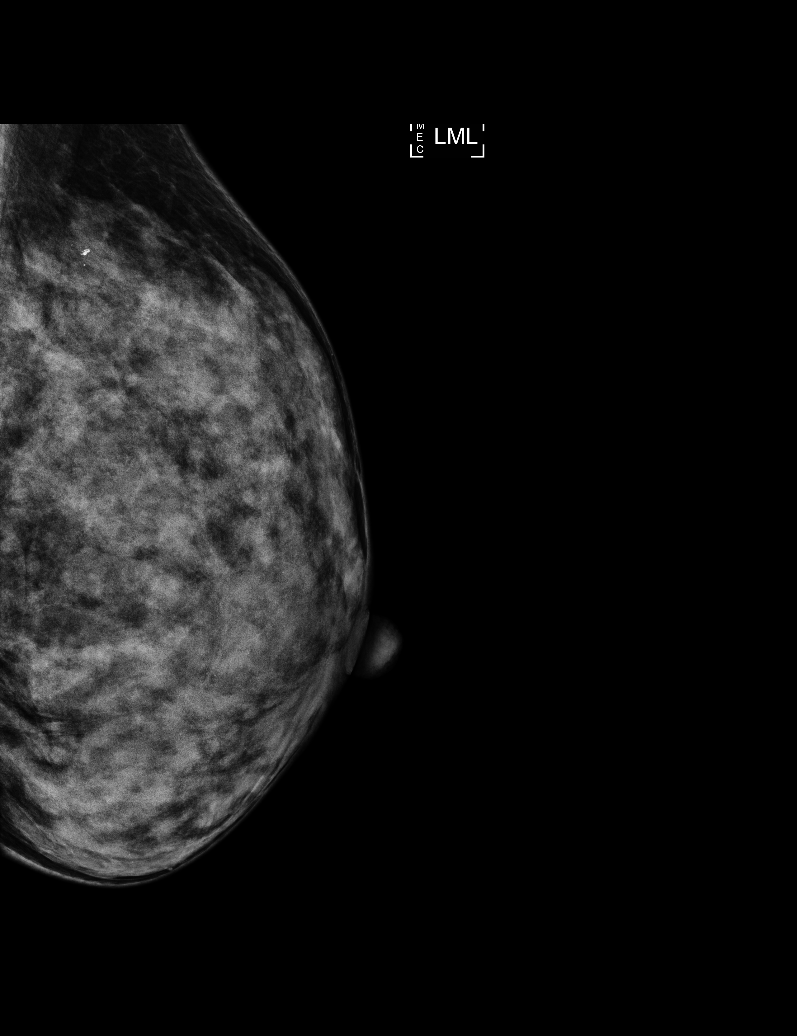

[L CC (1 of 2)]
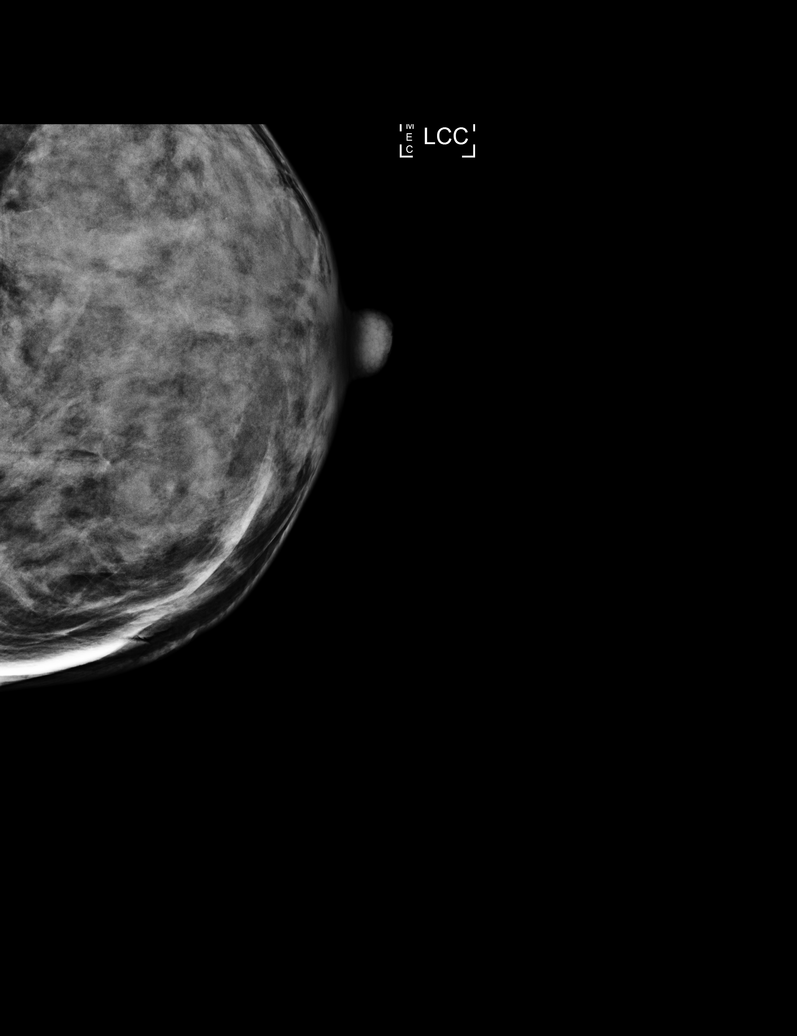

[L XCCM]
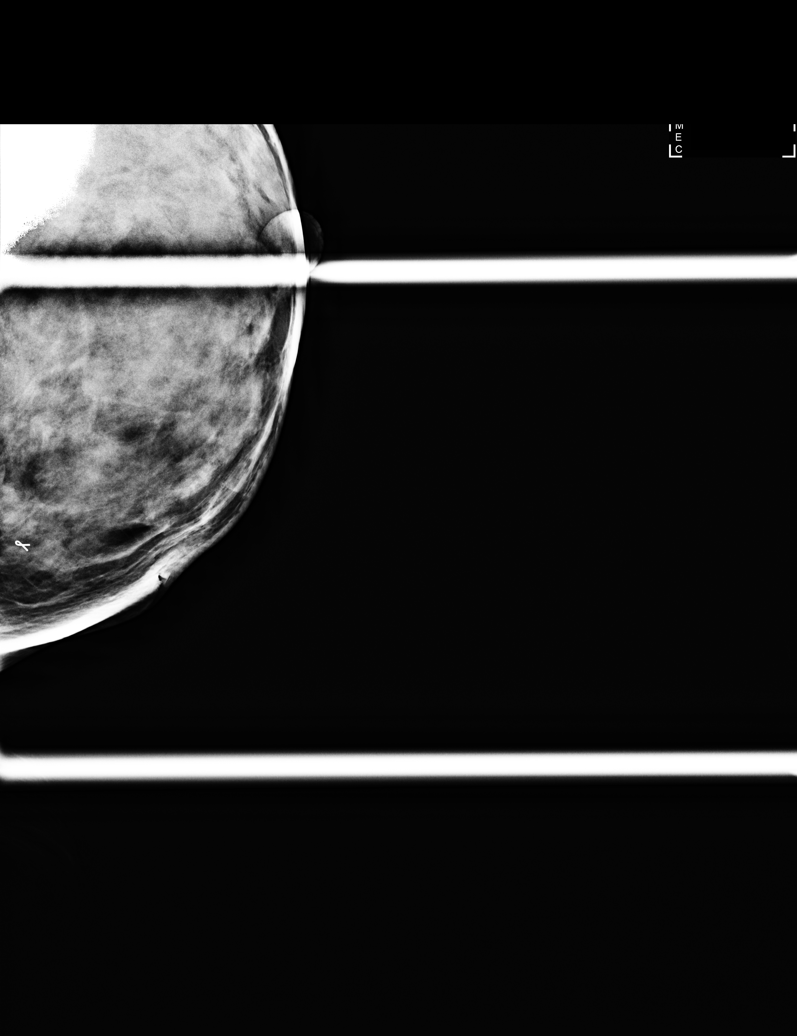

[L CC (2 of 2)]
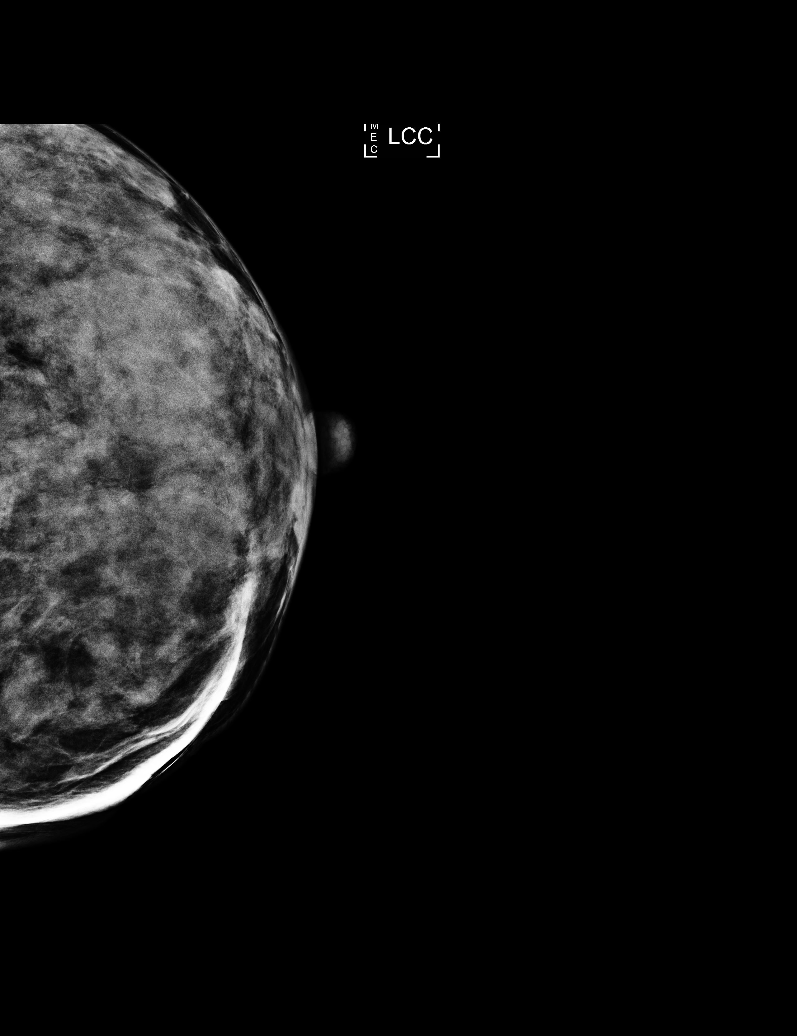

[5 of 5 positions shown; findings below may reference images not displayed]

FINDINGS: Mammographic images were obtained following ultrasound guided biopsy
of the targeted mass at the 10 o'clock position. Images demonstrates
satisfactory placement of a ribbon shaped metallic clip over the
biopsied mass.
IMPRESSION: Satisfactory clip placement post ultrasound-guided core needle
biopsy left breast mass.

Final Assessment: Post Procedure Mammograms for Marker Placement

## 2018-12-01 IMAGING — US US BREAST BX W LOC DEV 1ST LESION IMG BX SPEC US GUIDE*L*
1 series · 7 of 7 positions shown · non-contrast
Comparison: Previous exam(s).

Addendum:
CLINICAL DATA: Patient presents for ultrasound-guided core needle
biopsy of a 9 mm indeterminate mass over the 10 o'clock position of
the left breast 7 cm from the nipple. Two additional probable benign
masses in the left breast at the 10 o'clock and 3 o'clock positions.
Recent right malignant mastectomy [DATE].

EXAM:
ULTRASOUND GUIDED LEFT BREAST CORE NEEDLE BIOPSY

[Series 1: us breast bx w loc dev 1st lesion img bx spec us g · 0.07mm/px · 7 of 7 slices shown]
[im 1/7]
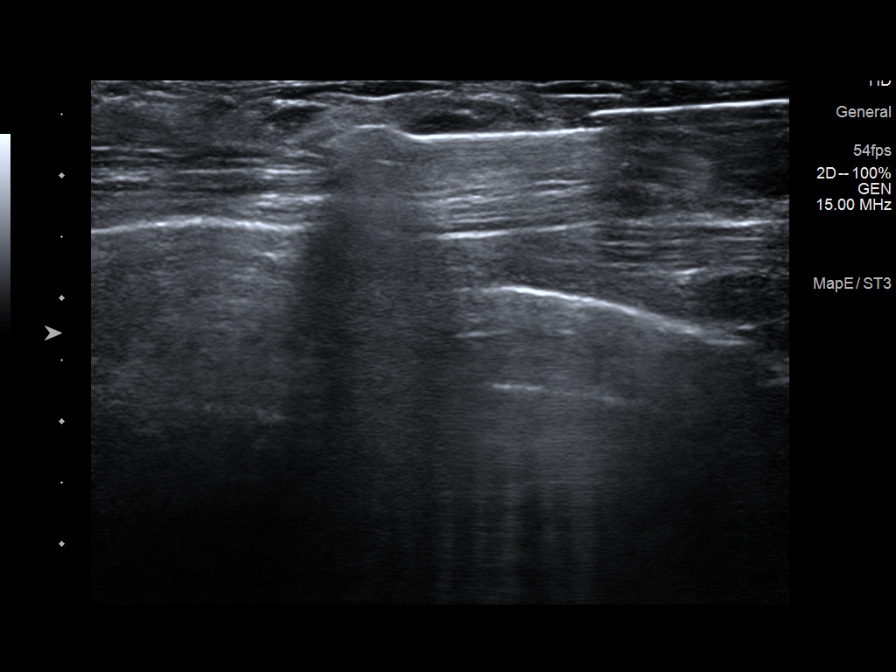
[im 2/7]
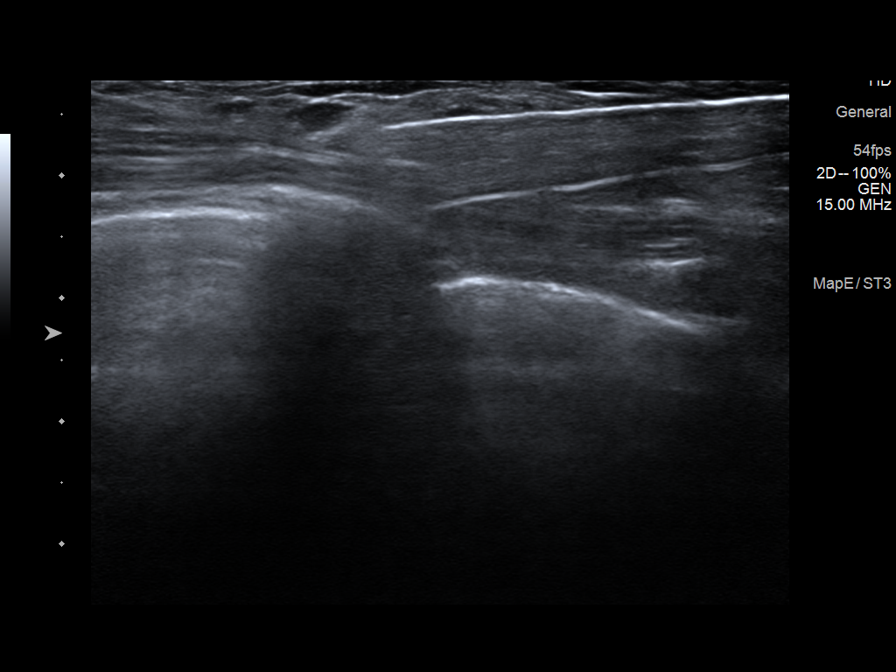
[im 3/7]
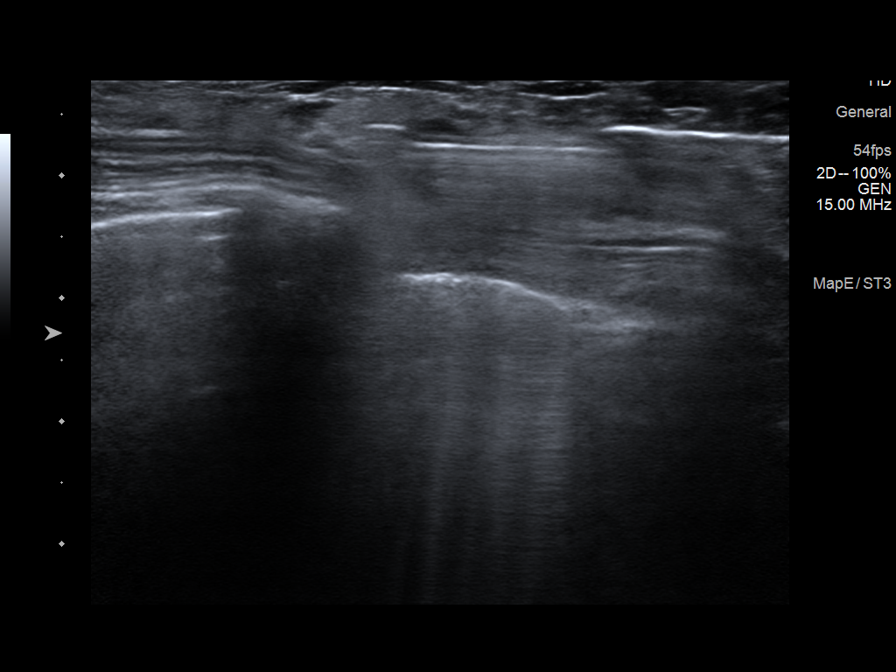
[im 4/7]
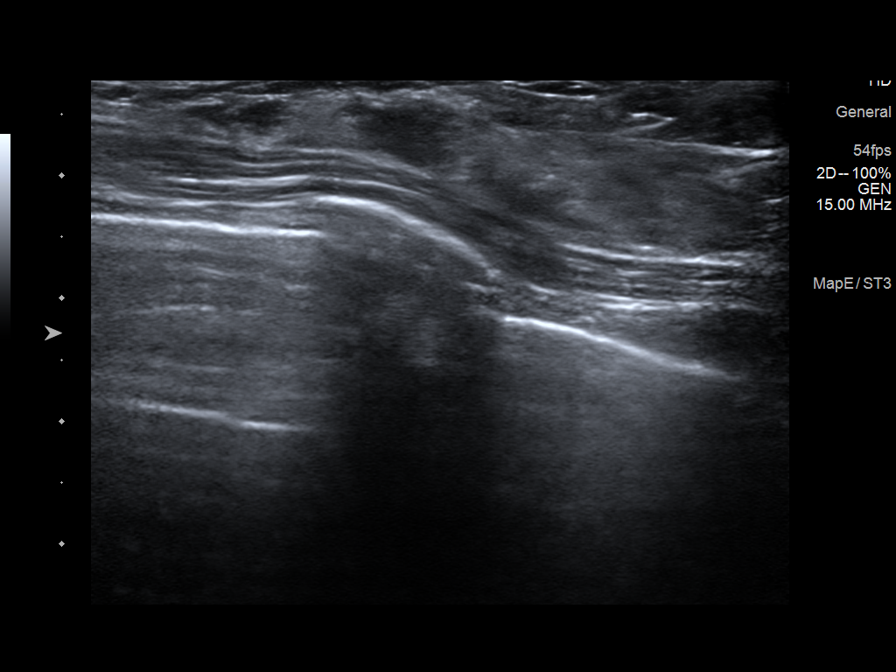
[im 5/7]
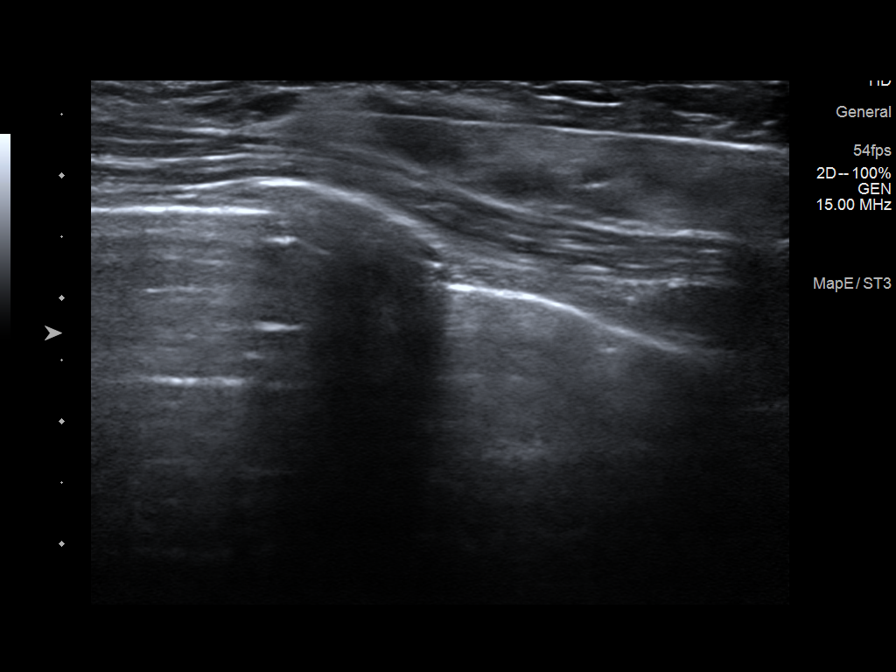
[im 6/7]
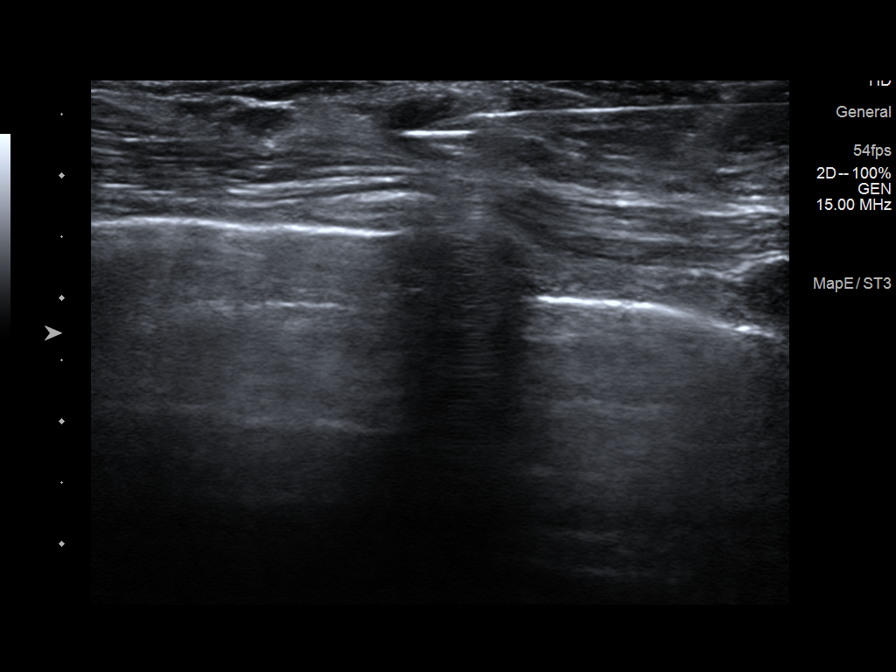
[im 7/7]
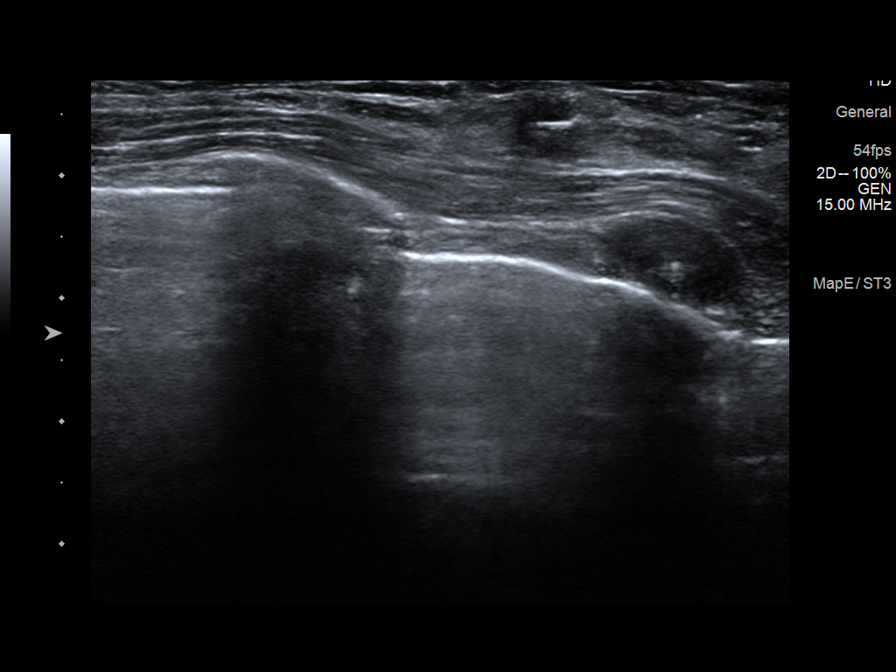

[7 of 7 positions shown; findings below may reference images not displayed]



Lesion quadrant: Left upper inner quadrant.

Using sterile technique and 1% Lidocaine as local anesthetic, under
direct ultrasound visualization, a 14 gauge JONATAN device was
used to perform biopsy of the targeted mass at the 10 o'clock
position 7 cm from the nipple using a inferior to superior approach.
At the conclusion of the procedure a ribbon shaped tissue marker
clip was deployed into the biopsy cavity. Follow up 2 view mammogram
was performed and dictated separately.
IMPRESSION: Ultrasound guided biopsy of indeterminate left breast mass. No
apparent complications.

ADDENDUM:
Pathology revealed FIBROADENOMA of the Left breast, 10 o'clock, 7
cmfn. This was found to be concordant by Dr. JONATAN.

Pathology results were discussed with the patient's mother, JONATAN
JONATAN by telephone, per patient request. The patient's mother
reported her daughter did well after the biopsy with tenderness at
the site. Post biopsy instructions and care were reviewed and
questions were answered. The patient was encouraged to call The

The patient was asked to return for left diagnostic mammography and
ultrasound in 6 months and informed a reminder notice would be sent
regarding this appointment.

Consider breast MRI given personal history of pre menopausal Right
breast cancer, per previous diagnostic report.

Pathology results reported by JONATAN, RN on [DATE].

*** End of Addendum ***

## 2018-12-04 ENCOUNTER — Inpatient Hospital Stay (HOSPITAL_BASED_OUTPATIENT_CLINIC_OR_DEPARTMENT_OTHER): Payer: BLUE CROSS/BLUE SHIELD | Admitting: Hematology and Oncology

## 2018-12-04 VITALS — BP 100/69 | HR 74 | Temp 97.9°F | Resp 18 | Ht 61.0 in | Wt 122.3 lb

## 2018-12-04 DIAGNOSIS — Z170 Estrogen receptor positive status [ER+]: Secondary | ICD-10-CM

## 2018-12-04 DIAGNOSIS — Z79810 Long term (current) use of selective estrogen receptor modulators (SERMs): Secondary | ICD-10-CM

## 2018-12-04 DIAGNOSIS — Z9011 Acquired absence of right breast and nipple: Secondary | ICD-10-CM | POA: Diagnosis not present

## 2018-12-04 DIAGNOSIS — Z923 Personal history of irradiation: Secondary | ICD-10-CM

## 2018-12-04 DIAGNOSIS — C50011 Malignant neoplasm of nipple and areola, right female breast: Secondary | ICD-10-CM

## 2018-12-04 DIAGNOSIS — Z9221 Personal history of antineoplastic chemotherapy: Secondary | ICD-10-CM

## 2018-12-04 NOTE — Progress Notes (Signed)
Patient Care Team: Newton Pigg, MD as PCP - General (Obstetrics and Gynecology)  DIAGNOSIS:  Encounter Diagnosis  Name Primary?  . 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, estrogen receptor positive ([place])   09/30/2018 Surgery    Right mastectomy with axillary lymph node dissection in [place]: Grade 2 IDC, 5.3 cm, DCIS, margins negative, lymphovascular invasion present, 0/18 lymph nodes, ER 60%, PR 0%, HER-2 negative, Ki-67 90%, T3N0 stage II B    11/15/2018 Cancer Staging    Staging form: Breast, AJCC 8th Edition - Pathologic: Stage IIB (pT3, pN0, cM0, G2, ER+, PR-, HER2-) - Signed by Nicholas Lose, MD on 11/15/2018    11/29/2018 Oncotype testing    Oncotype DX score 28: 17% risk of distant recurrence at 9 years     CHIEF COMPLIANT: Follow-up to discuss results of Oncotype DX  INTERVAL HISTORY: Ariana Luna is a 43 year old with above-mentioned history of right breast cancer who underwent breast mastectomy on the right breast in [place] and she came to [place] for discussion regarding treatment options.  We performed Oncotype DX testing which showed Oncotype score of 28.  She is here to discuss implications of that test.  She was noted to have left breast abnormalities and she underwent biopsy on 12/01/2018.  REVIEW OF SYSTEMS:   Constitutional: Denies fevers, chills or abnormal weight loss Eyes: Denies blurriness of vision Ears, nose, mouth, throat, and face: Denies mucositis or sore throat Respiratory: Denies cough, dyspnea or wheezes Cardiovascular: Denies palpitation, chest discomfort Gastrointestinal:  Denies nausea, heartburn or change in bowel habits Skin: Denies abnormal skin rashes Lymphatics: Denies new lymphadenopathy or easy bruising Neurological:Denies numbness, tingling or new  weaknesses Behavioral/Psych: Mood is stable, no new changes  Extremities: No lower extremity edema Breast: Right mastectomy All other systems were reviewed with the patient and are negative.  I have reviewed the past medical history, past surgical history, social history and family history with the patient and they are unchanged from previous note.  ALLERGIES:  has no allergies on file.  PHYSICAL EXAMINATION: ECOG PERFORMANCE STATUS: 1 - Symptomatic but completely ambulatory  Vitals:   12/04/18 1223  BP: 100/69  Pulse: 74  Resp: 18  Temp: 97.9 F (36.6 C)  SpO2: 100%   Filed Weights   12/04/18 1223  Weight: 122 lb 4.8 oz (55.5 kg)    GENERAL:alert, no distress and comfortable SKIN: skin color, texture, turgor are normal, no rashes or significant lesions EYES: normal, Conjunctiva are pink and non-injected, sclera clear OROPHARYNX:no exudate, no erythema and lips, buccal mucosa, and tongue normal  NECK: supple, thyroid normal size, non-tender, without nodularity LYMPH:  no palpable lymphadenopathy in the cervical, axillary or inguinal LUNGS: clear to auscultation and percussion with normal breathing effort HEART: regular rate & rhythm and no murmurs and no lower extremity edema ABDOMEN:abdomen soft, non-tender and normal bowel sounds MUSCULOSKELETAL:no cyanosis of digits and no clubbing  NEURO: alert & oriented x 3 with fluent speech, no focal motor/sensory deficits EXTREMITIES: No lower extremity edema   LABORATORY DATA:  I have reviewed the data as listed No flowsheet data found.  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  Adjuvant chemotherapy with dose dense Adriamycin and Cytoxan x4 followed by Taxol weekly x12 2.  Adjuvant radiation therapy because of the tumor size being large 3.  Followed by adjuvant antiestrogen therapy with tamoxifen 20 mg daily x10 years  Left breast abnormality: This was noted in [place] but she did not have biopsy.   Mammogram: 11/28/2018: 0.9 cm lobular hypoechoic mass at 10:00 previously was 0.7 cm.,  Left breast 10 o'clock position 2 cm from nipple 0.9 cm cluster of cysts.  3 o'clock position 1.1 cm hypoechoic mass Biopsy of the left breast mass completed 12/01/2017  Genetic testing completed results pending.    Chemotherapy Counseling: I discussed the risks and benefits of chemotherapy including the risks of nausea/ vomiting, risk of infection from low WBC count, fatigue due to chemo or anemia, bruising or bleeding due to low platelets, mouth sores, loss/ change in taste and decreased appetite. Liver and kidney function will be monitored through out chemotherapy as abnormalities in liver and kidney function may be a side effect of treatment.  Risk of permanent bone marrow dysfunction and leukemia due to chemo were also discussed.  Return to clinic to start chemotherapy    No orders of the defined types were placed in this encounter.  The patient has a good understanding of the overall plan. she agrees with it. she will call with any problems that may develop before the next visit here.   Harriette Ohara, MD 12/04/18

## 2018-12-04 NOTE — Assessment & Plan Note (Addendum)
09/30/2018:Right mastectomy with axillary lymph node dissection in [place]: Grade 2 IDC, 5.3 cm, DCIS, margins negative, lymphovascular invasion present, 0/18 lymph nodes, ER 60%, PR 0%, HER-2 negative, Ki-67 90%, T3N0 stage II B Oncotype DX: 28: High risk  Treatment plan: 1.  Adjuvant chemotherapy with Taxotere and Cytoxan x4 2.  Adjuvant radiation therapy because of the tumor size being large 3.  Followed by adjuvant antiestrogen therapy with tamoxifen 20 mg daily x10 years  Left breast abnormality: This was noted in [place] but she did not have biopsy.   Mammogram: 11/28/2018: 0.9 cm lobular hypoechoic mass at 10:00 previously was 0.7 cm.,  Left breast 10 o'clock position 2 cm from nipple 0.9 cm cluster of cysts.  3 o'clock position 1.1 cm hypoechoic mass Biopsy of the left breast mass is recommended  Genetic counseling will be obtained.    Chemotherapy Counseling: I discussed the risks and benefits of chemotherapy including the risks of nausea/ vomiting, risk of infection from low WBC count, fatigue due to chemo or anemia, bruising or bleeding due to low platelets, mouth sores, loss/ change in taste and decreased appetite. Liver and kidney function will be monitored through out chemotherapy as abnormalities in liver and kidney function may be a side effect of treatment.  Risk of permanent bone marrow dysfunction and leukemia due to chemo were also discussed.  Return to clinic to start chemotherapy

## 2018-12-05 ENCOUNTER — Other Ambulatory Visit: Payer: Self-pay

## 2018-12-05 DIAGNOSIS — Z79899 Other long term (current) drug therapy: Principal | ICD-10-CM

## 2018-12-05 DIAGNOSIS — Z5181 Encounter for therapeutic drug level monitoring: Secondary | ICD-10-CM

## 2018-12-05 MED ORDER — LIDOCAINE-PRILOCAINE 2.5-2.5 % EX CREA: 1.0000 "application " | TOPICAL_CREAM | CUTANEOUS | 0 refills | Status: DC | PRN

## 2018-12-11 ENCOUNTER — Telehealth: Payer: Self-pay | Admitting: Genetic Counselor

## 2018-12-11 ENCOUNTER — Inpatient Hospital Stay: Payer: BLUE CROSS/BLUE SHIELD | Attending: Hematology and Oncology

## 2018-12-11 ENCOUNTER — Encounter: Payer: Self-pay | Admitting: Genetic Counselor

## 2018-12-11 DIAGNOSIS — Z9011 Acquired absence of right breast and nipple: Secondary | ICD-10-CM | POA: Insufficient documentation

## 2018-12-11 DIAGNOSIS — C50011 Malignant neoplasm of nipple and areola, right female breast: Secondary | ICD-10-CM | POA: Insufficient documentation

## 2018-12-11 DIAGNOSIS — R112 Nausea with vomiting, unspecified: Secondary | ICD-10-CM | POA: Insufficient documentation

## 2018-12-11 DIAGNOSIS — Z79899 Other long term (current) drug therapy: Secondary | ICD-10-CM | POA: Insufficient documentation

## 2018-12-11 DIAGNOSIS — R51 Headache: Secondary | ICD-10-CM | POA: Insufficient documentation

## 2018-12-11 DIAGNOSIS — Z5111 Encounter for antineoplastic chemotherapy: Secondary | ICD-10-CM | POA: Insufficient documentation

## 2018-12-11 DIAGNOSIS — Z1379 Encounter for other screening for genetic and chromosomal anomalies: Secondary | ICD-10-CM | POA: Insufficient documentation

## 2018-12-11 DIAGNOSIS — Z170 Estrogen receptor positive status [ER+]: Secondary | ICD-10-CM | POA: Insufficient documentation

## 2018-12-11 DIAGNOSIS — R5383 Other fatigue: Secondary | ICD-10-CM | POA: Insufficient documentation

## 2018-12-11 DIAGNOSIS — Z7689 Persons encountering health services in other specified circumstances: Secondary | ICD-10-CM | POA: Insufficient documentation

## 2018-12-11 NOTE — Telephone Encounter (Signed)
Using [facility] [phone], I LM on VM that resutls were back and to please call.

## 2018-12-12 ENCOUNTER — Other Ambulatory Visit: Payer: Self-pay | Admitting: Hematology and Oncology

## 2018-12-12 ENCOUNTER — Other Ambulatory Visit: Payer: Self-pay | Admitting: *Deleted

## 2018-12-12 ENCOUNTER — Encounter: Payer: Self-pay | Admitting: *Deleted

## 2018-12-12 ENCOUNTER — Inpatient Hospital Stay (HOSPITAL_COMMUNITY): Admission: RE | Admit: 2018-12-12 | Payer: BLUE CROSS/BLUE SHIELD | Source: Ambulatory Visit

## 2018-12-12 DIAGNOSIS — Z170 Estrogen receptor positive status [ER+]: Principal | ICD-10-CM

## 2018-12-12 DIAGNOSIS — C50011 Malignant neoplasm of nipple and areola, right female breast: Secondary | ICD-10-CM

## 2018-12-12 MED ORDER — ONDANSETRON HCL 8 MG PO TABS: 8.0000 mg | ORAL_TABLET | Freq: Two times a day (BID) | ORAL | 1 refills | Status: DC | PRN

## 2018-12-12 MED ORDER — DEXAMETHASONE 4 MG PO TABS: 4.0000 mg | ORAL_TABLET | Freq: Every day | ORAL | 0 refills | Status: DC

## 2018-12-12 MED ORDER — PROCHLORPERAZINE MALEATE 10 MG PO TABS: 10.0000 mg | ORAL_TABLET | Freq: Four times a day (QID) | ORAL | 1 refills | Status: DC | PRN

## 2018-12-12 MED ORDER — LORAZEPAM 0.5 MG PO TABS: 0.5000 mg | ORAL_TABLET | Freq: Every evening | ORAL | 0 refills | Status: DC | PRN

## 2018-12-12 MED ORDER — LIDOCAINE-PRILOCAINE 2.5-2.5 % EX CREA: TOPICAL_CREAM | CUTANEOUS | 3 refills | Status: DC

## 2018-12-12 NOTE — Addendum Note (Signed)
Addended by: Nicholas Lose on: 12/12/2018 05:20 PM   Modules accepted: Orders

## 2018-12-12 NOTE — Progress Notes (Signed)
START ON PATHWAY REGIMEN - Breast   Dose-Dense AC q14 days:   A cycle is every 14 days:     Doxorubicin      Cyclophosphamide      [id number]-[id number]   **Always confirm dose/schedule in your pharmacy ordering system**  Paclitaxel 80 mg/m2 Weekly:   Administer weekly:     Paclitaxel   **Always confirm dose/schedule in your pharmacy ordering system**  Patient Characteristics: Postoperative without Neoadjuvant Therapy (Pathologic Staging), Invasive Disease, Adjuvant Therapy, HER2 Negative/Unknown/Equivocal, ER Positive, Node Negative, pT1a-c, pN0/N6m or pT2 or Higher, pN0, Oncotype High Risk (? 26) Therapeutic Status: Postoperative without Neoadjuvant Therapy (Pathologic Staging) AJCC Grade: G2 AJCC N Category: pN0 AJCC M Category: cM0 ER Status: Positive (+) AJCC 8 Stage Grouping: IB HER2 Status: Negative (-) Oncotype Dx Recurrence Score: 28 AJCC T Category: pT3 PR Status: Positive (+) Has this patient completed genomic testing<= Yes - Oncotype DX(R) Intent of Therapy: Curative Intent, Discussed with Patient

## 2018-12-18 ENCOUNTER — Ambulatory Visit (HOSPITAL_COMMUNITY)
Admission: RE | Admit: 2018-12-18 | Discharge: 2018-12-18 | Disposition: A | Discharge status: Home / Self Care | Payer: BLUE CROSS/BLUE SHIELD | Source: Ambulatory Visit | Attending: Hematology and Oncology | Admitting: Hematology and Oncology

## 2018-12-18 ENCOUNTER — Telehealth: Payer: Self-pay | Admitting: Hematology and Oncology

## 2018-12-18 DIAGNOSIS — Z5181 Encounter for therapeutic drug level monitoring: Secondary | ICD-10-CM

## 2018-12-18 DIAGNOSIS — Z79899 Other long term (current) drug therapy: Secondary | ICD-10-CM | POA: Insufficient documentation

## 2018-12-18 DIAGNOSIS — C50919 Malignant neoplasm of unspecified site of unspecified female breast: Secondary | ICD-10-CM | POA: Insufficient documentation

## 2018-12-18 NOTE — Telephone Encounter (Signed)
Scheduled appt per 2/5 sch message - unable to reach pt - left message and sent reminder letter in the mail.

## 2018-12-18 NOTE — Progress Notes (Signed)
  Echocardiogram 2D Echocardiogram has been performed.  Darlina Sicilian M 12/18/2018, 1:41 PM

## 2018-12-22 NOTE — Telephone Encounter (Signed)
LM on VM for both phone numbers that results are back and to please call.

## 2018-12-25 NOTE — Telephone Encounter (Signed)
-----   Message from Clarene Essex, Counselor sent at 11/29/2018  3:31 PM EST ----- [place] - call for interpreter

## 2018-12-25 NOTE — Telephone Encounter (Signed)
Message was returned.  LM on VM that results are back and to please call.

## 2018-12-26 ENCOUNTER — Other Ambulatory Visit: Payer: Self-pay | Admitting: Radiology

## 2018-12-28 ENCOUNTER — Encounter (HOSPITAL_COMMUNITY): Payer: Self-pay

## 2018-12-28 ENCOUNTER — Ambulatory Visit (HOSPITAL_COMMUNITY)
Admission: RE | Admit: 2018-12-28 | Discharge: 2018-12-28 | Disposition: A | Discharge status: Home / Self Care | Payer: BLUE CROSS/BLUE SHIELD | Source: Ambulatory Visit | Attending: Hematology and Oncology | Admitting: Hematology and Oncology

## 2018-12-28 ENCOUNTER — Other Ambulatory Visit: Payer: Self-pay | Admitting: Hematology and Oncology

## 2018-12-28 ENCOUNTER — Encounter: Payer: Self-pay | Admitting: Genetic Counselor

## 2018-12-28 ENCOUNTER — Other Ambulatory Visit: Payer: Self-pay

## 2018-12-28 DIAGNOSIS — C50011 Malignant neoplasm of nipple and areola, right female breast: Secondary | ICD-10-CM | POA: Insufficient documentation

## 2018-12-28 DIAGNOSIS — Z170 Estrogen receptor positive status [ER+]: Secondary | ICD-10-CM | POA: Diagnosis not present

## 2018-12-28 HISTORY — DX: Malignant neoplasm of unspecified site of unspecified female breast: C50.919

## 2018-12-28 HISTORY — DX: Other specified postprocedural states: Z98.890

## 2018-12-28 HISTORY — PX: IR IMAGING GUIDED PORT INSERTION: IMG5740

## 2018-12-28 HISTORY — DX: Other specified postprocedural states: R11.2

## 2018-12-28 LAB — BASIC METABOLIC PANEL
Anion gap: 6 (ref 5–15)
BUN: 13 mg/dL (ref 6–20)
CO2: 26 mmol/L (ref 22–32)
Calcium: 8.9 mg/dL (ref 8.9–10.3)
Chloride: 104 mmol/L (ref 98–111)
Creatinine, Ser: 0.72 mg/dL (ref 0.44–1.00)
GFR calc Af Amer: >60 mL/min (ref >60)
GFR calc non Af Amer: >60 mL/min (ref >60)
GLUCOSE: 90 mg/dL (ref 70–99)
Potassium: 3.9 mmol/L (ref 3.5–5.1)
Sodium: 136 mmol/L (ref 135–145)

## 2018-12-28 LAB — CBC WITH DIFFERENTIAL/PLATELET
Abs Immature Granulocytes: 0.01 10*3/uL (ref 0.00–0.07)
Basophils Absolute: 0 10*3/uL (ref 0.0–0.1)
Basophils Relative: 1 %
Eosinophils Absolute: 0.2 10*3/uL (ref 0.0–0.5)
Eosinophils Relative: 4 %
HCT: 42.1 % (ref 36.0–46.0)
Hemoglobin: 13.4 g/dL (ref 12.0–15.0)
Immature Granulocytes: 0 %
LYMPHS PCT: 37 %
Lymphs Abs: 2 10*3/uL (ref 0.7–4.0)
MCH: 28.2 pg (ref 26.0–34.0)
MCHC: 31.8 g/dL (ref 30.0–36.0)
MCV: 88.6 fL (ref 80.0–100.0)
Monocytes Absolute: 0.4 10*3/uL (ref 0.1–1.0)
Monocytes Relative: 7 %
Neutro Abs: 2.7 10*3/uL (ref 1.7–7.7)
Neutrophils Relative %: 51 %
Platelets: 216 10*3/uL (ref 150–400)
RBC: 4.75 MIL/uL (ref 3.87–5.11)
RDW: 13 % (ref 11.5–15.5)
WBC: 5.4 10*3/uL (ref 4.0–10.5)
nRBC: 0 % (ref 0.0–0.2)

## 2018-12-28 LAB — PROTIME-INR
INR: 1.04
Prothrombin Time: 13.5 seconds (ref 11.4–15.2)

## 2018-12-28 IMAGING — US IR FLUORO GUIDE CV LINE*L*
1 series · 1 of 1 positions shown · non-contrast
Comparison: none

INDICATION: 42-year-old with right breast pacer. Port-A-Cath needed for
chemotherapy.

[Series 1: ir fluoro/shunt/fist · 1 of 1 slices shown]
[im 1/1]
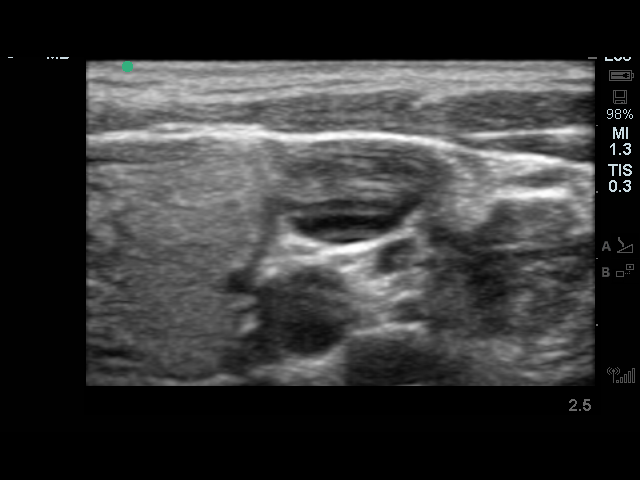

[1 of 1 positions shown; findings below may reference images not displayed]

EXAM:
FLUOROSCOPIC AND ULTRASOUND GUIDED PLACEMENT OF A SUBCUTANEOUS PORT.

MEDICATIONS:
Ancef 2 g; As antibiotic prophylaxis, Ancef 1 gm was ordered
pre-procedure and administered intravenously within one hour of
incision.

ANESTHESIA/SEDATION:
Versed 2.0 mg IV; Fentanyl 100 mcg IV;

Moderate Sedation Time:  39 minutes

The patient was continuously monitored during the procedure by the
interventional radiology nurse under my direct supervision.

FLUOROSCOPY TIME:  36 seconds, 2 mGy

COMPLICATIONS:
None immediate.

PROCEDURE:
The procedure was explained to the patient with an interpreter. The
risks and benefits of the procedure were discussed and the patient's
questions were addressed. Informed consent was obtained from the
patient. Patient was placed supine on the interventional table.
Ultrasound confirmed a patent left internal jugular vein. Ultrasound
image was saved for documentation. The left chest and neck were
cleaned with a skin antiseptic and a sterile drape was placed.
Maximal barrier sterile technique was utilized including caps, mask,
sterile gowns, sterile gloves, sterile drape, hand hygiene and skin
antiseptic. The left neck was anesthetized with 1% lidocaine. Small
incision was made in the left neck with a blade. Micropuncture set
was placed in the left IJ with ultrasound guidance. The
micropuncture wire was used for measurement purposes. The left chest
was anesthetized with 1% lidocaine with epinephrine. #15 blade was
used to make an incision and a subcutaneous port pocket was formed.
8 french Power Port was assembled. Subcutaneous tunnel was formed
with a stiff tunneling device. The port catheter was brought through
the subcutaneous tunnel. The port was placed in the subcutaneous
pocket. The micropuncture set was exchanged for a peel-away sheath.
The catheter was placed through the peel-away sheath and the tip was
positioned at the superior cavoatrial junction. Catheter placement
was confirmed with fluoroscopy. The port was accessed and flushed
with heparinized saline. The port pocket was closed using two layers
of absorbable sutures and Dermabond. The vein skin site was closed
using a single layer of absorbable suture and Dermabond. Sterile
dressings were applied. Patient tolerated the procedure well without
an immediate complication. Ultrasound and fluoroscopic images were
taken and saved for this procedure.
IMPRESSION: Placement of a subcutaneous port device. Catheter tip at the
superior cavoatrial junction and ready to be used.

## 2018-12-28 IMAGING — XA IR FLUORO GUIDE CV LINE*L*
1 series · 1 of 1 positions shown · non-contrast
Comparison: none

INDICATION: 42-year-old with right breast pacer. Port-A-Cath needed for
chemotherapy.

[Series 1: care single · 1 of 1 slices shown]
[im 1/1]
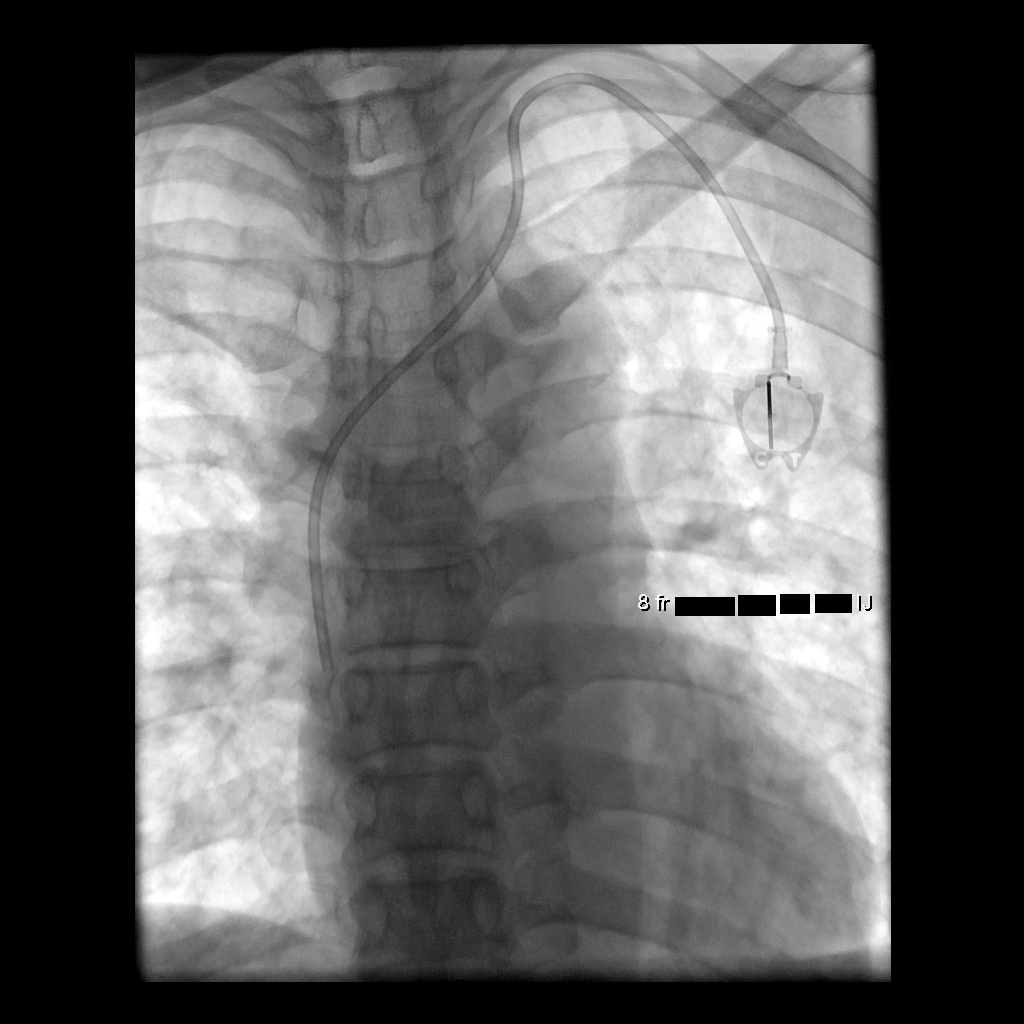

[1 of 1 positions shown; findings below may reference images not displayed]

EXAM:
FLUOROSCOPIC AND ULTRASOUND GUIDED PLACEMENT OF A SUBCUTANEOUS PORT.

MEDICATIONS:
Ancef 2 g; As antibiotic prophylaxis, Ancef 1 gm was ordered
pre-procedure and administered intravenously within one hour of
incision.

ANESTHESIA/SEDATION:
Versed 2.0 mg IV; Fentanyl 100 mcg IV;

Moderate Sedation Time:  39 minutes

The patient was continuously monitored during the procedure by the
interventional radiology nurse under my direct supervision.

FLUOROSCOPY TIME:  36 seconds, 2 mGy

COMPLICATIONS:
None immediate.

PROCEDURE:
The procedure was explained to the patient with an interpreter. The
risks and benefits of the procedure were discussed and the patient's
questions were addressed. Informed consent was obtained from the
patient. Patient was placed supine on the interventional table.
Ultrasound confirmed a patent left internal jugular vein. Ultrasound
image was saved for documentation. The left chest and neck were
cleaned with a skin antiseptic and a sterile drape was placed.
Maximal barrier sterile technique was utilized including caps, mask,
sterile gowns, sterile gloves, sterile drape, hand hygiene and skin
antiseptic. The left neck was anesthetized with 1% lidocaine. Small
incision was made in the left neck with a blade. Micropuncture set
was placed in the left IJ with ultrasound guidance. The
micropuncture wire was used for measurement purposes. The left chest
was anesthetized with 1% lidocaine with epinephrine. #15 blade was
used to make an incision and a subcutaneous port pocket was formed.
8 french Power Port was assembled. Subcutaneous tunnel was formed
with a stiff tunneling device. The port catheter was brought through
the subcutaneous tunnel. The port was placed in the subcutaneous
pocket. The micropuncture set was exchanged for a peel-away sheath.
The catheter was placed through the peel-away sheath and the tip was
positioned at the superior cavoatrial junction. Catheter placement
was confirmed with fluoroscopy. The port was accessed and flushed
with heparinized saline. The port pocket was closed using two layers
of absorbable sutures and Dermabond. The vein skin site was closed
using a single layer of absorbable suture and Dermabond. Sterile
dressings were applied. Patient tolerated the procedure well without
an immediate complication. Ultrasound and fluoroscopic images were
taken and saved for this procedure.
IMPRESSION: Placement of a subcutaneous port device. Catheter tip at the
superior cavoatrial junction and ready to be used.

## 2018-12-28 MED ORDER — MIDAZOLAM HCL 2 MG/2ML IJ SOLN
INTRAMUSCULAR | Status: AC | PRN
  Administered 2018-12-28 (×2): 1 mg via INTRAVENOUS

## 2018-12-28 MED ORDER — SODIUM CHLORIDE 0.9 % IV SOLN
INTRAVENOUS | Status: DC
  Administered 2018-12-28: 13:00:00 via INTRAVENOUS

## 2018-12-28 MED ORDER — HEPARIN SOD (PORK) LOCK FLUSH 100 UNIT/ML IV SOLN
INTRAVENOUS | Status: AC
  Filled 2018-12-28: qty 5

## 2018-12-28 MED ORDER — LIDOCAINE HCL 1 % IJ SOLN
INTRAMUSCULAR | Status: AC
  Filled 2018-12-28: qty 20

## 2018-12-28 MED ORDER — CEFAZOLIN SODIUM-DEXTROSE 2-4 GM/100ML-% IV SOLN
2.0000 g | INTRAVENOUS | Status: AC
  Administered 2018-12-28: 2 g via INTRAVENOUS

## 2018-12-28 MED ORDER — LIDOCAINE HCL (PF) 1 % IJ SOLN
INTRAMUSCULAR | Status: AC | PRN
  Administered 2018-12-28: 5 mL

## 2018-12-28 MED ORDER — FENTANYL CITRATE (PF) 100 MCG/2ML IJ SOLN
INTRAMUSCULAR | Status: AC
  Filled 2018-12-28: qty 2

## 2018-12-28 MED ORDER — LIDOCAINE-EPINEPHRINE (PF) 2 %-1:200000 IJ SOLN
INTRAMUSCULAR | Status: AC
  Filled 2018-12-28: qty 20

## 2018-12-28 MED ORDER — ONDANSETRON 4 MG PO TBDP
4.0000 mg | ORAL_TABLET | Freq: Once | ORAL | Status: AC
  Administered 2018-12-28: 4 mg via ORAL
  Filled 2018-12-28: qty 1

## 2018-12-28 MED ORDER — CEFAZOLIN SODIUM-DEXTROSE 2-4 GM/100ML-% IV SOLN
INTRAVENOUS | Status: AC
  Administered 2018-12-28: 2 g via INTRAVENOUS
  Filled 2018-12-28: qty 100

## 2018-12-28 MED ORDER — LIDOCAINE-EPINEPHRINE (PF) 2 %-1:200000 IJ SOLN
INTRAMUSCULAR | Status: AC | PRN
  Administered 2018-12-28: 10 mL

## 2018-12-28 MED ORDER — FENTANYL CITRATE (PF) 100 MCG/2ML IJ SOLN
INTRAMUSCULAR | Status: AC | PRN
  Administered 2018-12-28 (×2): 50 ug via INTRAVENOUS

## 2018-12-28 MED ORDER — MIDAZOLAM HCL 2 MG/2ML IJ SOLN
INTRAMUSCULAR | Status: AC
  Filled 2018-12-28: qty 4

## 2018-12-28 MED ORDER — HEPARIN SOD (PORK) LOCK FLUSH 100 UNIT/ML IV SOLN
INTRAVENOUS | Status: AC | PRN
  Administered 2018-12-28: 500 [IU] via INTRAVENOUS

## 2018-12-28 NOTE — Procedures (Signed)
Interventional Radiology Procedure:   Indications: Right breast cancer  Procedure: Port placement  Findings: Left jugular port, tip at SVC/RA junction  Complications: None     EBL: Minimal  Plan: Discharge to home in one hour.     Ariana Luna R. Anselm Pancoast, MD  Pager: [phone]

## 2018-12-28 NOTE — Assessment & Plan Note (Signed)
09/30/2018:Right mastectomy with axillary lymph node dissection in [place]: Grade 2 IDC, 5.3 cm, DCIS, margins negative, lymphovascular invasion present, 0/18 lymph nodes, ER 60%, PR 0%, HER-2 negative, Ki-67 90%, T3N0 stage II B Oncotype DX: 28: High risk  Treatment plan: 1.  Adjuvant chemotherapy with dose dense Adriamycin and Cytoxan x4 followed by Taxol weekly x12 2.  Adjuvant radiation therapy because of the tumor size being large 3.  Followed by adjuvant antiestrogen therapy with tamoxifen 20 mg daily x10 years ------------------------------------------------------------------------------------------------------------------- Current Treatment: Cycle 1 day 1 Adriamycin and Cytoxan ECHO feb 2020: EF 60-65% Labs reviewed Chemo education completed Chemo consent obtained.  RTC in 1 week for Tox check

## 2018-12-28 NOTE — H&P (Signed)
Chief Complaint: Patient was seen in consultation today for breast cancer.  Referring Physician(s): SWNIOE,VOJJK  Supervising Physician: Markus Daft  Patient Status: [facility] - Out-pt  History of Present Illness: Ariana Luna is a 43 y.o. female with a past medical history of breast cancer. She was unfourtanetly diagnosed with breast cancer in 09/2018. She underwent a right mastectomy with axillary lymph node dissection in [place] on 09/30/2018. Since arriving to the [place], her cancer has been managed by Dr. Lindi Adie. She has tentative plans to begin chemoradiation therapy.  IR requested by Dr. Lindi Adie for possible image-guided Port-a-cath insertion. Patient awake and alert laying in bed with no complaints at this time. Denies fever, chills, chest pain, dyspnea, abdominal pain, dizziness, or headache.  Pre procedure IR questions completed with assistance from interpreter Ariana Luna.   Past Medical History:  Diagnosis Date  . Breast cancer ([place])     Allergies: Patient has no allergy information on record.  Medications: Prior to Admission medications   Medication Sig Start Date End Date Taking? Authorizing Provider  dexamethasone (DECADRON) 4 MG tablet Take 1 tablet (4 mg total) by mouth daily. Take 1 tablet day after chemo and 1 tablet 2 days after chemo with food 12/12/18   Nicholas Lose, MD  lidocaine-prilocaine (EMLA) cream Apply 1 application topically as needed. 12/05/18   Nicholas Lose, MD  lidocaine-prilocaine (EMLA) cream Apply to affected area once 12/12/18   Nicholas Lose, MD  LORazepam (ATIVAN) 0.5 MG tablet Take 1 tablet (0.5 mg total) by mouth at bedtime as needed for sleep. 12/12/18   Nicholas Lose, MD  ondansetron (ZOFRAN) 8 MG tablet Take 1 tablet (8 mg total) by mouth 2 (two) times daily as needed. Start on the third day after chemotherapy. 12/12/18   Nicholas Lose, MD  prochlorperazine (COMPAZINE) 10 MG tablet Take 1 tablet (10 mg total) by mouth every 6  (six) hours as needed (Nausea or vomiting). 12/12/18   Nicholas Lose, MD     History reviewed. No pertinent family history.  Social History   Socioeconomic History  . Marital status: Married    Spouse name: Not on file  . Number of children: Not on file  . Years of education: Not on file  . Highest education level: Not on file  Occupational History  . Not on file  Social Needs  . Financial resource strain: Not on file  . Food insecurity:    Worry: Not on file    Inability: Not on file  . Transportation needs:    Medical: Not on file    Non-medical: Not on file  Tobacco Use  . Smoking status: Never Smoker  . Smokeless tobacco: Never Used  Substance and Sexual Activity  . Alcohol use: Not on file  . Drug use: Not on file  . Sexual activity: Not on file  Lifestyle  . Physical activity:    Days per week: Not on file    Minutes per session: Not on file  . Stress: Not on file  Relationships  . Social connections:    Talks on phone: Not on file    Gets together: Not on file    Attends religious service: Not on file    Active member of club or organization: Not on file    Attends meetings of clubs or organizations: Not on file    Relationship status: Not on file  Other Topics Concern  . Not on file  Social History Narrative  . Not on file  Review of Systems: A 12 point ROS discussed and pertinent positives are indicated in the HPI above.  All other systems are negative.  Review of Systems  Constitutional: Negative for chills and fever.  Respiratory: Negative for shortness of breath and wheezing.   Cardiovascular: Negative for chest pain and palpitations.  Gastrointestinal: Negative for abdominal pain.  Neurological: Negative for dizziness and headaches.  Psychiatric/Behavioral: Negative for behavioral problems and confusion.    Vital Signs: BP 102/79   Pulse 85   Temp 98 F (36.7 C) (Oral)   Resp 18   SpO2 100%   Physical Exam Vitals signs and nursing  note reviewed.  Constitutional:      General: She is not in acute distress.    Appearance: Normal appearance.  Cardiovascular:     Rate and Rhythm: Normal rate and regular rhythm.     Heart sounds: Normal heart sounds. No murmur.  Pulmonary:     Effort: Pulmonary effort is normal. No respiratory distress.     Breath sounds: Normal breath sounds. No wheezing.  Skin:    General: Skin is warm and dry.  Neurological:     Mental Status: She is alert and oriented to person, place, and time.  Psychiatric:        Mood and Affect: Mood normal.        Behavior: Behavior normal.        Thought Content: Thought content normal.        Judgment: Judgment normal.      MD Evaluation Airway: WNL Heart: WNL Abdomen: WNL Chest/ Lungs: WNL ASA  Classification: 2 Mallampati/Airway Score: One   Imaging: US Breast Ltd Uni Left Inc Axilla  Result Date: 11/28/2018 CLINICAL DATA:  Patient with recent diagnosis of right breast cancer status post mastectomy. Additionally, patient had multiple nodules within the left breast, for short-term follow-up evaluation. Patient's initial evaluation and surgery was done outside of the country. EXAM: DIGITAL DIAGNOSTIC LEFT MAMMOGRAM WITH CAD AND TOMO ULTRASOUND LEFT BREAST COMPARISON:  Previous exam(s). ACR Breast Density Category d: The breast tissue is extremely dense, which lowers the sensitivity of mammography. FINDINGS: No concerning masses, calcifications or distortion identified within the left breast. Mammographic images were processed with CAD. On physical exam, I palpate no discrete mass within the upper inner left breast. Targeted ultrasound is performed, showing a 0.9 x 0.5 x 0.7 cm lobular hypoechoic mass left breast 10 o'clock position 7 cm from nipple, slightly increased in size from recent prior ultrasound where it measured 0.7 x 0.7 x 0.4 cm. Within the left breast 10 o'clock position 2 cm from nipple there is a 0.7 x 0.9 x 0.5 cm cluster of cysts.  Within the left breast 3 o'clock position 6 cm from nipple there is a 1.1 x 0.3 x 0.8 cm oval circumscribed hypoechoic mass. IMPRESSION: 1. Slight interval increase in size of indeterminate left breast mass 10 o'clock position 7 cm from the nipple. 2. Probably benign left breast mass 10 o'clock position 2 cm from nipple, favored to represent a cluster of cysts. 3. Prior benign left breast mass 3 o'clock position, favored to represent a fibroadenoma. RECOMMENDATION: 1. Ultrasound-guided core needle biopsy left breast mass 10 o'clock position 7 cm from nipple. 2. If this demonstrates benign pathology, recommend follow-up left breast mammogram and ultrasound in 6 months to ensure stability of additional probably benign masses 10 o'clock position and 3 o'clock position. 3. Consider breast MRI given personal history of breast cancer. I have discussed the  findings and recommendations with the patient. Results were also provided in writing at the conclusion of the visit. If applicable, a reminder letter will be sent to the patient regarding the next appointment. BI-RADS CATEGORY  4: Suspicious. Electronically Signed   By: Lovey Newcomer M.D.   On: 11/28/2018 16:44   Mm Diag Breast Tomo Uni Left  Result Date: 11/28/2018 CLINICAL DATA:  Patient with recent diagnosis of right breast cancer status post mastectomy. Additionally, patient had multiple nodules within the left breast, for short-term follow-up evaluation. Patient's initial evaluation and surgery was done outside of the country. EXAM: DIGITAL DIAGNOSTIC LEFT MAMMOGRAM WITH CAD AND TOMO ULTRASOUND LEFT BREAST COMPARISON:  Previous exam(s). ACR Breast Density Category d: The breast tissue is extremely dense, which lowers the sensitivity of mammography. FINDINGS: No concerning masses, calcifications or distortion identified within the left breast. Mammographic images were processed with CAD. On physical exam, I palpate no discrete mass within the upper inner left  breast. Targeted ultrasound is performed, showing a 0.9 x 0.5 x 0.7 cm lobular hypoechoic mass left breast 10 o'clock position 7 cm from nipple, slightly increased in size from recent prior ultrasound where it measured 0.7 x 0.7 x 0.4 cm. Within the left breast 10 o'clock position 2 cm from nipple there is a 0.7 x 0.9 x 0.5 cm cluster of cysts. Within the left breast 3 o'clock position 6 cm from nipple there is a 1.1 x 0.3 x 0.8 cm oval circumscribed hypoechoic mass. IMPRESSION: 1. Slight interval increase in size of indeterminate left breast mass 10 o'clock position 7 cm from the nipple. 2. Probably benign left breast mass 10 o'clock position 2 cm from nipple, favored to represent a cluster of cysts. 3. Prior benign left breast mass 3 o'clock position, favored to represent a fibroadenoma. RECOMMENDATION: 1. Ultrasound-guided core needle biopsy left breast mass 10 o'clock position 7 cm from nipple. 2. If this demonstrates benign pathology, recommend follow-up left breast mammogram and ultrasound in 6 months to ensure stability of additional probably benign masses 10 o'clock position and 3 o'clock position. 3. Consider breast MRI given personal history of breast cancer. I have discussed the findings and recommendations with the patient. Results were also provided in writing at the conclusion of the visit. If applicable, a reminder letter will be sent to the patient regarding the next appointment. BI-RADS CATEGORY  4: Suspicious. Electronically Signed   By: Lovey Newcomer M.D.   On: 11/28/2018 16:44   Mm Clip Placement Left  Result Date: 12/01/2018 CLINICAL DATA:  Patient is post ultrasound-guided core needle biopsy of a 9 mm indeterminate mass over the 10 o'clock position of the left breast 7 cm from the nipple. EXAM: DIAGNOSTIC left MAMMOGRAM POST ultrasound BIOPSY COMPARISON:  Previous exam(s). FINDINGS: Mammographic images were obtained following ultrasound guided biopsy of the targeted mass at the 10 o'clock  position. Images demonstrates satisfactory placement of a ribbon shaped metallic clip over the biopsied mass. IMPRESSION: Satisfactory clip placement post ultrasound-guided core needle biopsy left breast mass. Final Assessment: Post Procedure Mammograms for Marker Placement Electronically Signed   By: Marin Olp M.D.   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 This was found to be concordant by Dr. Marin Olp. Pathology results were discussed with the patient's mother, Ariana Luna [email or web address] by  telephone, per patient request. The patient's mother reported her daughter did well after the biopsy with tenderness at the site. Post biopsy instructions and care were reviewed and questions were answered. The patient was encouraged to call [facility] for any additional concerns. The patient was asked to return for left diagnostic mammography and ultrasound in 6 months and informed a reminder notice would be sent regarding this appointment. Consider breast MRI given personal history of pre menopausal Right breast cancer, per previous diagnostic report. Pathology results reported by Terie Purser, RN on 12/05/2018. Electronically Signed   By: Marin Olp M.D.   On: 12/05/2018 06:56   Result Date: 12/05/2018 CLINICAL DATA:  Patient presents for ultrasound-guided core needle biopsy of a 9 mm indeterminate mass over the 10 o'clock position of the left breast 7 cm from the nipple. Two additional probable benign masses in the left breast at the 10 o'clock and 3 o'clock positions. Recent right malignant mastectomy November 2019. EXAM: ULTRASOUND GUIDED LEFT BREAST CORE NEEDLE BIOPSY COMPARISON:  Previous exam(s). FINDINGS: I met with the patient and we discussed the procedure of ultrasound-guided biopsy, including  benefits and alternatives. We discussed the high likelihood of a successful procedure. We discussed the risks of the procedure, including infection, bleeding, tissue injury, clip migration, and inadequate sampling. Informed written consent was given. The usual time-out protocol was performed immediately prior to the procedure. Lesion quadrant: Left upper inner quadrant. Using sterile technique and 1% Lidocaine as local anesthetic, under direct ultrasound visualization, a 14 gauge spring-loaded device was used to perform biopsy of the targeted mass at the 10 o'clock position 7 cm from the nipple using a inferior to superior approach. At the conclusion of the procedure a ribbon shaped tissue marker clip was deployed into the biopsy cavity. Follow up 2 view mammogram was performed and dictated separately. IMPRESSION: Ultrasound guided biopsy of indeterminate left breast mass. No apparent complications. Electronically Signed: By: Marin Olp M.D. On: 12/01/2018 13:41    Labs:  CBC: Recent Labs    12/28/18 1221  WBC 5.4  HGB 13.4  HCT 42.1  PLT 216    COAGS: Recent Labs    12/28/18 1221  INR 1.04    BMP: Recent Labs    12/28/18 1221  NA 136  K 3.9  CL 104  CO2 26  GLUCOSE 90  BUN 13  CALCIUM 8.9  CREATININE 0.72  GFRNONAA >60  GFRAA >60    Assessment and Plan:  Breast cancer with tentative plans to begin chemoradiation therapy. Plan for image-guided Port-a-cath insertion today with Dr. Anselm Pancoast. Patient is NPO. Afebrile and WBCs WNL. She does not take blood thinners. INR 1.04 seconds today.  Risks and benefits of image guided port-a-catheter placement was discussed with the patient including, but not limited to bleeding, infection, pneumothorax, or fibrin sheath development and need for additional procedures. All of the patient's questions were answered, patient is agreeable to proceed. Consent signed and in chart.  Pre procedure IR questions completed with assistance from  interpreter Ariana Luna.   Thank you for this interesting consult.  I greatly enjoyed meeting Ariana Luna and look forward to participating in their care.  A copy of this report was sent to the requesting provider on this date.  Electronically Signed: Earley Abide, PA-C 12/28/2018, 12:56 PM   I spent a total of 40 Minutes in face to face in clinical consultation, greater than 50% of which was counseling/coordinating care for breast cancer.

## 2018-12-28 NOTE — Telephone Encounter (Signed)
Left another message.  Will send a letter.

## 2018-12-28 NOTE — Discharge Instructions (Signed)
Moderate Conscious Sedation, Adult, Care After These instructions provide you with information about caring for yourself after your procedure. Your health care provider may also give you more specific instructions. Your treatment has been planned according to current medical practices, but problems sometimes occur. Call your health care provider if you have any problems or questions after your procedure. What can I expect after the procedure? After your procedure, it is common:  To feel sleepy for several hours.  To feel clumsy and have poor balance for several hours.  To have poor judgment for several hours.  To vomit if you eat too soon. Follow these instructions at home: For at least 24 hours after the procedure:   Do not: ? Participate in activities where you could fall or become injured. ? Drive. ? Use heavy machinery. ? Drink alcohol. ? Take sleeping pills or medicines that cause drowsiness. ? Make important decisions or sign legal documents. ? Take care of children on your own.  Rest. Eating and drinking  Follow the diet recommended by your health care provider.  If you vomit: ? Drink water, juice, or soup when you can drink without vomiting. ? Make sure you have little or no nausea before eating solid foods. General instructions  Have a responsible adult stay with you until you are awake and alert.  Take over-the-counter and prescription medicines only as told by your health care provider.  If you smoke, do not smoke without supervision.  Keep all follow-up visits as told by your health care provider. This is important. Contact a health care provider if:  You keep feeling nauseous or you keep vomiting.  You feel light-headed.  You develop a rash.  You have a fever. Get help right away if:  You have trouble breathing. This information is not intended to replace advice given to you by your health care provider. Make sure you discuss any questions you have  with your health care provider. Document Released: 08/15/2013 Document Revised: 03/29/2016 Document Reviewed: 02/14/2016 Elsevier Interactive Patient Education  2019 [place] may shower and remove your dressing tomorrow.  DO NOT use EMLA cream for 2 weeks after port is laced as this cream will remove surgical glue on your incision.  Implanted [facility] Guide An implanted port is a device that is placed under the skin. It is usually placed in the chest. The device can be used to give IV medicine, to take blood, or for dialysis. You may have an implanted port if:  You need IV medicine that would be irritating to the small veins in your hands or arms.  You need IV medicines, such as antibiotics, for a long period of time.  You need IV nutrition for a long period of time.  You need dialysis. Having a port means that your health care provider will not need to use the veins in your arms for these procedures. You may have fewer limitations when using a port than you would if you used other types of long-term IVs, and you will likely be able to return to normal activities after your incision heals. An implanted port has two main parts:  Reservoir. The reservoir is the part where a needle is inserted to give medicines or draw blood. The reservoir is round. After it is placed, it appears as a small, raised area under your skin.  Catheter. The catheter is a thin, flexible tube that connects the reservoir to a vein. Medicine that is inserted into the  reservoir goes into the catheter and then into the vein. How is my port accessed? To access your port:  A numbing cream may be placed on the skin over the port site.  Your health care provider will put on a mask and sterile gloves.  The skin over your port will be cleaned carefully with a germ-killing soap and allowed to dry.  Your health care provider will gently pinch the port and insert a needle into it.  Your health care provider  will check for a blood return to make sure the port is in the vein and is not clogged.  If your port needs to remain accessed to get medicine continuously (constant infusion), your health care provider will place a clear bandage (dressing) over the needle site. The dressing and needle will need to be changed every week, or as told by your health care provider. What is flushing? Flushing helps keep the port from getting clogged. Follow instructions from your health care provider about how and when to flush the port. Ports are usually flushed with saline solution or a medicine called heparin. The need for flushing will depend on how the port is used:  If the port is only used from time to time to give medicines or draw blood, the port may need to be flushed: ? Before and after medicines have been given. ? Before and after blood has been drawn. ? As part of routine maintenance. Flushing may be recommended every 4-6 weeks.  If a constant infusion is running, the port may not need to be flushed.  Throw away any syringes in a disposal container that is meant for sharp items (sharps container). You can buy a sharps container from a pharmacy, or you can make one by using an empty hard plastic bottle with a cover. How long will my port stay implanted? The port can stay in for as long as your health care provider thinks it is needed. When it is time for the port to come out, a surgery will be done to remove it. The surgery will be similar to the procedure that was done to put the port in. Follow these instructions at home:   Flush your port as told by your health care provider.  If you need an infusion over several days, follow instructions from your health care provider about how to take care of your port site. Make sure you: ? Wash your hands with soap and water before you change your dressing. If soap and water are not available, use alcohol-based hand sanitizer. ? Change your dressing as told by  your health care provider. ? Place any used dressings or infusion bags into a plastic bag. Throw that bag in the trash. ? Keep the dressing that covers the needle clean and dry. Do not get it wet. ? Do not use scissors or sharp objects near the tube. ? Keep the tube clamped, unless it is being used.  Check your port site every day for signs of infection. Check for: ? Redness, swelling, or pain. ? Fluid or blood. ? Pus or a bad smell.  Protect the skin around the port site. ? Avoid wearing bra straps that rub or irritate the site. ? Protect the skin around your port from seat belts. Place a soft pad over your chest if needed.  Bathe or shower as told by your health care provider. The site may get wet as long as you are not actively receiving an infusion.  Return to  your normal activities as told by your health care provider. Ask your health care provider what activities are safe for you.  Carry a medical alert card or wear a medical alert bracelet at all times. This will let health care providers know that you have an implanted port in case of an emergency. Get help right away if:  You have redness, swelling, or pain at the port site.  You have fluid or blood coming from your port site.  You have pus or a bad smell coming from the port site.  You have a fever. Summary  Implanted ports are usually placed in the chest for long-term IV access.  Follow instructions from your health care provider about flushing the port and changing bandages (dressings).  Take care of the area around your port by avoiding clothing that puts pressure on the area, and by watching for signs of infection.  Protect the skin around your port from seat belts. Place a soft pad over your chest if needed.  Get help right away if you have a fever or you have redness, swelling, pain, drainage, or a bad smell at the port site. This information is not intended to replace advice given to you by your health care  provider. Make sure you discuss any questions you have with your health care provider. Document Released: 10/25/2005 Document Revised: 11/27/2016 Document Reviewed: 11/27/2016 Elsevier Interactive Patient Education  2019 [employer].

## 2018-12-28 NOTE — Progress Notes (Signed)
Pre procedure IR questions completed with assistance from interpreter [place].

## 2018-12-28 NOTE — Progress Notes (Signed)
Patient Care Team: Newton Pigg, MD as PCP - General (Obstetrics and Gynecology)  DIAGNOSIS:    ICD-10-CM   1. 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, estrogen receptor positive ([place])   09/30/2018 Surgery    Right mastectomy with axillary lymph node dissection in [place]: Grade 2 IDC, 5.3 cm, DCIS, margins negative, lymphovascular invasion present, 0/18 lymph nodes, ER 60%, PR 0%, HER-2 negative, Ki-67 90%, T3N0 stage II B    11/15/2018 Cancer Staging    Staging form: Breast, AJCC 8th Edition - Pathologic: Stage IIB (pT3, pN0, cM0, G2, ER+, PR-, HER2-) - Signed by Nicholas Lose, MD on 11/15/2018    11/29/2018 Oncotype testing    Oncotype DX score 28: 17% risk of distant recurrence at 9 years    12/08/2018 Genetic Testing    Negative genetic testing on the common hereditary cancer panel.  The Hereditary Gene Panel offered by [facility] includes sequencing and/or deletion duplication testing of the following 47 genes: APC, ATM, AXIN2, BARD1, BMPR1A, BRCA1, BRCA2, BRIP1, CDH1, CDK4, CDKN2A (p14ARF), CDKN2A (p16INK4a), CHEK2, CTNNA1, DICER1, EPCAM (Deletion/duplication testing only), GREM1 (promoter region deletion/duplication testing only), KIT, MEN1, MLH1, MSH2, MSH3, MSH6, MUTYH, NBN, NF1, NHTL1, PALB2, PDGFRA, PMS2, POLD1, POLE, PTEN, RAD50, RAD51C, RAD51D, SDHB, SDHC, SDHD, SMAD4, SMARCA4. STK11, TP53, TSC1, TSC2, and VHL.  The following genes were evaluated for sequence changes only: SDHA and HOXB13 c.251G>A variant only. The report date is December 08, 2018.    12/29/2018 -  Chemotherapy    The patient had DOXOrubicin (ADRIAMYCIN) chemo injection 94 mg, 60 mg/m2 = 94 mg, Intravenous,  Once, 0 of 4 cycles palonosetron (ALOXI) injection 0.25 mg, 0.25 mg, Intravenous,  Once, 0 of 4  cycles pegfilgrastim-cbqv (UDENYCA) injection 6 mg, 6 mg, Subcutaneous, Once, 0 of 4 cycles cyclophosphamide (CYTOXAN) 940 mg in sodium chloride 0.9 % 250 mL chemo infusion, 600 mg/m2 = 940 mg, Intravenous,  Once, 0 of 4 cycles PACLitaxel (TAXOL) 126 mg in sodium chloride 0.9 % 250 mL chemo infusion (</= 25m/m2), 80 mg/m2, Intravenous,  Once, 0 of 12 cycles fosaprepitant (EMEND) 150 mg, dexamethasone (DECADRON) 12 mg in sodium chloride 0.9 % 145 mL IVPB, , Intravenous,  Once, 0 of 4 cycles  for chemotherapy treatment.      CHIEF COMPLIANT: Cycle 1 Taxotere and Cytoxan  INTERVAL HISTORY: Ariana Luna is a 43y.o. with above-mentioned history of right breast cancer [place] Onc[place] DX testing showed a score of 28. She had left breast abnormalities which were biopsied on 12/01/18 and negative for carcinoma. An ECHO from 12/18/18 showed an ejection fraction in the range of 60-65%. She presents to the clinic today with her husband, parents, and uncle, who is translating for the visit, for cycle 1 of Taxotere and Cytoxan. She is sore at the port site and nauseous from the anesthesia, which resulted in vomiting when she tried to eat last night. She had difficulty sleeping due to a migraine and the nausea, but was able to eat this morning. She asked if she could drink coffee and take Tylenol for migraines. She is not interested in a wig. Her labs from today show: WBC 6.8, Hg 13.2, platelets 204, ANC 4.5.   REVIEW OF  SYSTEMS:   Constitutional: Denies fevers, chills or abnormal weight loss (+) migraine Eyes: Denies blurriness of vision Ears, nose, mouth, throat, and face: Denies mucositis or sore throat Respiratory: Denies cough, dyspnea or wheezes Cardiovascular: Denies palpitation, chest discomfort Gastrointestinal: Denies heartburn or change in bowel habits (+) nausea (+) vomiting Skin: Denies abnormal skin  rashes Lymphatics: Denies new lymphadenopathy or easy bruising Neurological: Denies numbness, tingling or new weaknesses Behavioral/Psych: Mood is stable, no new changes (+) difficulty sleeping Extremities: No lower extremity edema Breast: denies any pain or lumps or nodules in either breasts (+) soreness at port site All other systems were reviewed with the patient and are negative.  I have reviewed the past medical history, past surgical history, social history and family history with the patient and they are unchanged from previous note.  ALLERGIES:  has no allergies on file.  MEDICATIONS:  Current Outpatient Medications  Medication Sig Dispense Refill  . dexamethasone (DECADRON) 4 MG tablet Take 1 tablet (4 mg total) by mouth daily. Take 1 tablet day after chemo and 1 tablet 2 days after chemo with food 8 tablet 0  . lidocaine-prilocaine (EMLA) cream Apply 1 application topically as needed. 30 g 0  . lidocaine-prilocaine (EMLA) cream Apply to affected area once 30 g 3  . LORazepam (ATIVAN) 0.5 MG tablet Take 1 tablet (0.5 mg total) by mouth at bedtime as needed for sleep. 30 tablet 0  . ondansetron (ZOFRAN) 8 MG tablet Take 1 tablet (8 mg total) by mouth 2 (two) times daily as needed. Start on the third day after chemotherapy. 30 tablet 1  . prochlorperazine (COMPAZINE) 10 MG tablet Take 1 tablet (10 mg total) by mouth every 6 (six) hours as needed (Nausea or vomiting). 30 tablet 1   No current facility-administered medications for this visit.    Facility-Administered Medications Ordered in Other Visits  Medication Dose Route Frequency Provider Last Rate Last Dose  . sodium chloride flush (NS) 0.9 % injection 10 mL  10 mL Intracatheter PRN Nicholas Lose, MD   10 mL at 12/29/18 0930    PHYSICAL EXAMINATION: ECOG PERFORMANCE STATUS: 1 - Symptomatic but completely ambulatory  Vitals:   12/29/18 0944  BP: 104/81  Pulse: 80  Resp: 16  Temp: 97.7 F (36.5 C)  SpO2: 100%   Filed  Weights   12/29/18 0944  Weight: 121 lb 3.2 oz (55 kg)    GENERAL: alert, no distress and comfortable SKIN: skin color, texture, turgor are normal, no rashes or significant lesions EYES: normal, Conjunctiva are pink and non-injected, sclera clear OROPHARYNX: no exudate, no erythema and lips, buccal mucosa, and tongue normal  NECK: supple, thyroid normal size, non-tender, without nodularity LYMPH: no palpable lymphadenopathy in the cervical, axillary or inguinal LUNGS: clear to auscultation and percussion with normal breathing effort HEART: regular rate & rhythm and no murmurs and no lower extremity edema ABDOMEN: abdomen soft, non-tender and normal bowel sounds MUSCULOSKELETAL: no cyanosis of digits and no clubbing  NEURO: alert & oriented x 3 with fluent speech, no focal motor/sensory deficits EXTREMITIES: No lower extremity edema  LABORATORY DATA:  I have reviewed the data as listed CMP Latest Ref Rng & Units 12/29/2018 12/28/2018  Glucose 70 - 99 mg/dL 119(H) 90  BUN 6 - 20 mg/dL 9 13  Creatinine 0.44 - 1.00 mg/dL 0.77 0.72  Sodium 135 - 145 mmol/L 139 136  Potassium 3.5 - 5.1 mmol/L 3.6 3.9  Chloride 98 - 111 mmol/L 105 104  CO2 22 -  32 mmol/L 23 26  Calcium 8.9 - 10.3 mg/dL 9.2 8.9  Total Protein 6.5 - 8.1 g/dL 7.8 -  Total Bilirubin 0.3 - 1.2 mg/dL 0.9 -  Alkaline Phos 38 - 126 U/L 83 -  AST 15 - 41 U/L 18 -  ALT 0 - 44 U/L 31 -    Lab Results  Component Value Date   WBC 6.8 12/29/2018   HGB 13.2 12/29/2018   HCT 40.6 12/29/2018   MCV 86.0 12/29/2018   PLT 204 12/29/2018   NEUTROABS 4.5 12/29/2018    ASSESSMENT & PLAN:  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  Adjuvant chemotherapy with dose dense Adriamycin and Cytoxan x4 followed by Taxol weekly x12 2.  Adjuvant radiation therapy because of the tumor size being large 3.  Followed by adjuvant antiestrogen therapy with tamoxifen 20 mg daily x10 years ------------------------------------------------------------------------------------------------------------------- Current Treatment: Cycle 1 day 1 Adriamycin and Cytoxan ECHO feb 2020: EF 60-65% Labs reviewed  Patient experienced severe nausea to port placement related anesthesia.  This morning she is feeling a lot better. We spent a lot of time going over her medications.  Chemo education completed Chemo consent obtained.  RTC in 1 week for Tox check     No orders of the defined types were placed in this encounter.  The patient has a good understanding of the overall plan. she agrees with it. she will call with any problems that may develop before the next visit here.  Nicholas Lose, MD 12/29/2018  Julious Oka Dorshimer am acting as scribe for Dr. Nicholas Lose.  I have reviewed the above documentation for accuracy and completeness, and I agree with the above.

## 2018-12-29 ENCOUNTER — Inpatient Hospital Stay: Payer: BLUE CROSS/BLUE SHIELD

## 2018-12-29 ENCOUNTER — Inpatient Hospital Stay (HOSPITAL_BASED_OUTPATIENT_CLINIC_OR_DEPARTMENT_OTHER): Payer: BLUE CROSS/BLUE SHIELD | Admitting: Hematology and Oncology

## 2018-12-29 ENCOUNTER — Encounter: Payer: Self-pay | Admitting: Hematology and Oncology

## 2018-12-29 ENCOUNTER — Encounter: Payer: Self-pay | Admitting: *Deleted

## 2018-12-29 DIAGNOSIS — Z170 Estrogen receptor positive status [ER+]: Principal | ICD-10-CM

## 2018-12-29 DIAGNOSIS — Z5111 Encounter for antineoplastic chemotherapy: Secondary | ICD-10-CM

## 2018-12-29 DIAGNOSIS — R51 Headache: Secondary | ICD-10-CM | POA: Diagnosis not present

## 2018-12-29 DIAGNOSIS — C50011 Malignant neoplasm of nipple and areola, right female breast: Secondary | ICD-10-CM | POA: Diagnosis not present

## 2018-12-29 DIAGNOSIS — Z95828 Presence of other vascular implants and grafts: Secondary | ICD-10-CM | POA: Insufficient documentation

## 2018-12-29 DIAGNOSIS — Z79899 Other long term (current) drug therapy: Secondary | ICD-10-CM

## 2018-12-29 DIAGNOSIS — Z7689 Persons encountering health services in other specified circumstances: Secondary | ICD-10-CM

## 2018-12-29 DIAGNOSIS — R112 Nausea with vomiting, unspecified: Secondary | ICD-10-CM | POA: Diagnosis not present

## 2018-12-29 DIAGNOSIS — Z9011 Acquired absence of right breast and nipple: Secondary | ICD-10-CM | POA: Diagnosis not present

## 2018-12-29 DIAGNOSIS — R5383 Other fatigue: Secondary | ICD-10-CM | POA: Diagnosis not present

## 2018-12-29 LAB — CMP (CANCER CENTER ONLY)
ALT: 31 U/L (ref 0–44)
AST: 18 U/L (ref 15–41)
Albumin: 4.1 g/dL (ref 3.5–5.0)
Alkaline Phosphatase: 83 U/L (ref 38–126)
Anion gap: 11 (ref 5–15)
BUN: 9 mg/dL (ref 6–20)
CO2: 23 mmol/L (ref 22–32)
Calcium: 9.2 mg/dL (ref 8.9–10.3)
Chloride: 105 mmol/L (ref 98–111)
Creatinine: 0.77 mg/dL (ref 0.44–1.00)
GFR, Est AFR Am: >60 mL/min (ref >60)
GFR, Estimated: >60 mL/min (ref >60)
Glucose, Bld: 119 mg/dL — ABNORMAL HIGH (ref 70–99)
Potassium: 3.6 mmol/L (ref 3.5–5.1)
Sodium: 139 mmol/L (ref 135–145)
Total Bilirubin: 0.9 mg/dL (ref 0.3–1.2)
Total Protein: 7.8 g/dL (ref 6.5–8.1)

## 2018-12-29 LAB — CBC WITH DIFFERENTIAL (CANCER CENTER ONLY)
Abs Immature Granulocytes: 0.02 10*3/uL (ref 0.00–0.07)
Basophils Absolute: 0 10*3/uL (ref 0.0–0.1)
Basophils Relative: 0 %
EOS PCT: 2 %
Eosinophils Absolute: 0.1 10*3/uL (ref 0.0–0.5)
HCT: 40.6 % (ref 36.0–46.0)
Hemoglobin: 13.2 g/dL (ref 12.0–15.0)
Immature Granulocytes: 0 %
Lymphocytes Relative: 25 %
Lymphs Abs: 1.7 10*3/uL (ref 0.7–4.0)
MCH: 28 pg (ref 26.0–34.0)
MCHC: 32.5 g/dL (ref 30.0–36.0)
MCV: 86 fL (ref 80.0–100.0)
Monocytes Absolute: 0.5 10*3/uL (ref 0.1–1.0)
Monocytes Relative: 7 %
Neutro Abs: 4.5 10*3/uL (ref 1.7–7.7)
Neutrophils Relative %: 66 %
Platelet Count: 204 10*3/uL (ref 150–400)
RBC: 4.72 MIL/uL (ref 3.87–5.11)
RDW: 13 % (ref 11.5–15.5)
WBC Count: 6.8 10*3/uL (ref 4.0–10.5)
nRBC: 0 % (ref 0.0–0.2)

## 2018-12-29 MED ORDER — SODIUM CHLORIDE 0.9% FLUSH
10.0000 mL | INTRAVENOUS | Status: DC | PRN
  Administered 2018-12-29: 10 mL
  Filled 2018-12-29: qty 10

## 2018-12-29 MED ORDER — PALONOSETRON HCL INJECTION 0.25 MG/5ML
INTRAVENOUS | Status: AC
  Filled 2018-12-29: qty 5

## 2018-12-29 MED ORDER — DOXORUBICIN HCL CHEMO IV INJECTION 2 MG/ML
60.0000 mg/m2 | Freq: Once | INTRAVENOUS | Status: AC
  Administered 2018-12-29: 94 mg via INTRAVENOUS
  Filled 2018-12-29: qty 47

## 2018-12-29 MED ORDER — SODIUM CHLORIDE 0.9 % IV SOLN
Freq: Once | INTRAVENOUS | Status: AC
  Administered 2018-12-29: 11:00:00 via INTRAVENOUS
  Filled 2018-12-29: qty 5

## 2018-12-29 MED ORDER — PALONOSETRON HCL INJECTION 0.25 MG/5ML
0.2500 mg | Freq: Once | INTRAVENOUS | Status: AC
  Administered 2018-12-29: 0.25 mg via INTRAVENOUS

## 2018-12-29 MED ORDER — SODIUM CHLORIDE 0.9 % IV SOLN
600.0000 mg/m2 | Freq: Once | INTRAVENOUS | Status: AC
  Administered 2018-12-29: 940 mg via INTRAVENOUS
  Filled 2018-12-29: qty 47

## 2018-12-29 MED ORDER — SODIUM CHLORIDE 0.9 % IV SOLN
Freq: Once | INTRAVENOUS | Status: AC
  Administered 2018-12-29: 11:00:00 via INTRAVENOUS
  Filled 2018-12-29: qty 250

## 2018-12-29 MED ORDER — HEPARIN SOD (PORK) LOCK FLUSH 100 UNIT/ML IV SOLN
500.0000 [IU] | Freq: Once | INTRAVENOUS | Status: AC | PRN
  Administered 2018-12-29: 500 [IU]
  Filled 2018-12-29: qty 5

## 2018-12-29 NOTE — Progress Notes (Signed)
Met w/ pt, her husband and an interpreter to introduce myself as her Arboriculturist and to discuss copay assistance.  Pt gave me consent to apply in her behalf soI enrolled her in the [place] $15,000 for 12 monthsfrom2/21/20.  Pt will pay $0 for her first treatment and $0 for each subsequent treatment (up to $7,200 per treatment). I also informed her of the [employer], went over what it covers, gave her an expense sheet and the income requirement.  If she would like to apply she will bring proof of income on 01/01/19.  She has my card for anyquestions or concernsshe may havein the future.

## 2018-12-29 NOTE — Patient Instructions (Signed)
[facility] Discharge Instructions for Patients Receiving Chemotherapy  Today you received the following chemotherapy agents: doxorubicin (Adriamycin) and cyclophosphamide (Cytoxan).   To help prevent nausea and vomiting after your treatment, we encourage you to take your nausea medication as prescribed by your physician.    If you develop nausea and vomiting that is not controlled by your nausea medication, call the clinic.   BELOW ARE SYMPTOMS THAT SHOULD BE REPORTED IMMEDIATELY:  *FEVER GREATER THAN 100.5 F  *CHILLS WITH OR WITHOUT FEVER  NAUSEA AND VOMITING THAT IS NOT CONTROLLED WITH YOUR NAUSEA MEDICATION  *UNUSUAL SHORTNESS OF BREATH  *UNUSUAL BRUISING OR BLEEDING  TENDERNESS IN MOUTH AND THROAT WITH OR WITHOUT PRESENCE OF ULCERS  *URINARY PROBLEMS  *BOWEL PROBLEMS  UNUSUAL RASH Items with * indicate a potential emergency and should be followed up as soon as possible.  Feel free to call the clinic should you have any questions or concerns. The clinic phone number is [phone].  Please show the CHEMO ALERT CARD at check-in to the Emergency Department and triage nurse.  Doxorubicin injection What is this medicine? DOXORUBICIN (dox oh ROO bi sin) is a chemotherapy drug. It is used to treat many kinds of cancer like leukemia, lymphoma, neuroblastoma, sarcoma, and Wilms' tumor. It is also used to treat bladder cancer, breast cancer, lung cancer, ovarian cancer, stomach cancer, and thyroid cancer. This medicine may be used for other purposes; ask your health care provider or pharmacist if you have questions. COMMON BRAND NAME(S): Adriamycin, Adriamycin PFS, Adriamycin RDF, Rubex What should I tell my health care provider before I take this medicine? They need to know if you have any of these conditions: -heart disease -history of low blood counts caused by a medicine -liver disease -recent or ongoing radiation therapy -an unusual or allergic reaction  to doxorubicin, other chemotherapy agents, other medicines, foods, dyes, or preservatives -pregnant or trying to get pregnant -breast-feeding How should I use this medicine? This drug is given as an infusion into a vein. It is administered in a hospital or clinic by a specially trained health care professional. If you have pain, swelling, burning or any unusual feeling around the site of your injection, tell your health care professional right away. Talk to your pediatrician regarding the use of this medicine in children. Special care may be needed. Overdosage: If you think you have taken too much of this medicine contact a poison control center or emergency room at once. NOTE: This medicine is only for you. Do not share this medicine with others. What if I miss a dose? It is important not to miss your dose. Call your doctor or health care professional if you are unable to keep an appointment. What may interact with this medicine? This medicine may interact with the following medications: -6-mercaptopurine -paclitaxel -phenytoin -St. John's Wort -trastuzumab -verapamil This list may not describe all possible interactions. Give your health care provider a list of all the medicines, herbs, non-prescription drugs, or dietary supplements you use. Also tell them if you smoke, drink alcohol, or use illegal drugs. Some items may interact with your medicine. What should I watch for while using this medicine? This drug may make you feel generally unwell. This is not uncommon, as chemotherapy can affect healthy cells as well as cancer cells. Report any side effects. Continue your course of treatment even though you feel ill unless your doctor tells you to stop. There is a maximum amount of this medicine you should receive   throughout your life. The amount depends on the medical condition being treated and your overall health. Your doctor will watch how much of this medicine you receive in your lifetime.  Tell your doctor if you have taken this medicine before. You may need blood work done while you are taking this medicine. Your urine may turn red for a few days after your dose. This is not blood. If your urine is dark or brown, call your doctor. In some cases, you may be given additional medicines to help with side effects. Follow all directions for their use. Call your doctor or health care professional for advice if you get a fever, chills or sore throat, or other symptoms of a cold or flu. Do not treat yourself. This drug decreases your body's ability to fight infections. Try to avoid being around people who are sick. This medicine may increase your risk to bruise or bleed. Call your doctor or health care professional if you notice any unusual bleeding. Talk to your doctor about your risk of cancer. You may be more at risk for certain types of cancers if you take this medicine. Do not become pregnant while taking this medicine or for 6 months after stopping it. Women should inform their doctor if they wish to become pregnant or think they might be pregnant. Men should not father a child while taking this medicine and for 6 months after stopping it. There is a potential for serious side effects to an unborn child. Talk to your health care professional or pharmacist for more information. Do not breast-feed an infant while taking this medicine. This medicine has caused ovarian failure in some women and reduced sperm counts in some men This medicine may interfere with the ability to have a child. Talk with your doctor or health care professional if you are concerned about your fertility. This medicine may cause a decrease in Co-Enzyme Q-10. You should make sure that you get enough Co-Enzyme Q-10 while you are taking this medicine. Discuss the foods you eat and the vitamins you take with your health care professional. What side effects may I notice from receiving this medicine? Side effects that you should  report to your doctor or health care professional as soon as possible: -allergic reactions like skin rash, itching or hives, swelling of the face, lips, or tongue -breathing problems -chest pain -fast or irregular heartbeat -low blood counts - this medicine may decrease the number of white blood cells, red blood cells and platelets. You may be at increased risk for infections and bleeding. -pain, redness, or irritation at site where injected -signs of infection - fever or chills, cough, sore throat, pain or difficulty passing urine -signs of decreased platelets or bleeding - bruising, pinpoint red spots on the skin, black, tarry stools, blood in the urine -swelling of the ankles, feet, hands -tiredness -weakness Side effects that usually do not require medical attention (report to your doctor or health care professional if they continue or are bothersome): -diarrhea -hair loss -mouth sores -nail discoloration or damage -nausea -red colored urine -vomiting This list may not describe all possible side effects. Call your doctor for medical advice about side effects. You may report side effects to FDA at 1-800-FDA-1088. Where should I keep my medicine? This drug is given in a hospital or clinic and will not be stored at home. NOTE: This sheet is a summary. It may not cover all possible information. If you have questions about this medicine, talk to your doctor,   pharmacist, or health care provider.  2019 Elsevier/Gold Standard (2017-06-08 11:01:26)  Cyclophosphamide injection What is this medicine? CYCLOPHOSPHAMIDE (sye kloe FOSS fa mide) is a chemotherapy drug. It slows the growth of cancer cells. This medicine is used to treat many types of cancer like lymphoma, myeloma, leukemia, breast cancer, and ovarian cancer, to name a few. This medicine may be used for other purposes; ask your health care provider or pharmacist if you have questions. COMMON BRAND NAME(S): Cytoxan, Neosar What  should I tell my health care provider before I take this medicine? They need to know if you have any of these conditions: -blood disorders -history of other chemotherapy -infection -kidney disease -liver disease -recent or ongoing radiation therapy -tumors in the bone marrow -an unusual or allergic reaction to cyclophosphamide, other chemotherapy, other medicines, foods, dyes, or preservatives -pregnant or trying to get pregnant -breast-feeding How should I use this medicine? This drug is usually given as an injection into a vein or muscle or by infusion into a vein. It is administered in a hospital or clinic by a specially trained health care professional. Talk to your pediatrician regarding the use of this medicine in children. Special care may be needed. Overdosage: If you think you have taken too much of this medicine contact a poison control center or emergency room at once. NOTE: This medicine is only for you. Do not share this medicine with others. What if I miss a dose? It is important not to miss your dose. Call your doctor or health care professional if you are unable to keep an appointment. What may interact with this medicine? This medicine may interact with the following medications: -amiodarone -amphotericin B -azathioprine -certain antiviral medicines for HIV or AIDS such as protease inhibitors (e.g., indinavir, ritonavir) and zidovudine -certain blood pressure medications such as benazepril, captopril, enalapril, fosinopril, lisinopril, moexipril, monopril, perindopril, quinapril, ramipril, trandolapril -certain cancer medications such as anthracyclines (e.g., daunorubicin, doxorubicin), busulfan, cytarabine, paclitaxel, pentostatin, tamoxifen, trastuzumab -certain diuretics such as chlorothiazide, chlorthalidone, hydrochlorothiazide, indapamide, metolazone -certain medicines that treat or prevent blood clots like warfarin -certain muscle relaxants such as  succinylcholine -cyclosporine -etanercept -indomethacin -medicines to increase blood counts like filgrastim, pegfilgrastim, sargramostim -medicines used as general anesthesia -metronidazole -natalizumab This list may not describe all possible interactions. Give your health care provider a list of all the medicines, herbs, non-prescription drugs, or dietary supplements you use. Also tell them if you smoke, drink alcohol, or use illegal drugs. Some items may interact with your medicine. What should I watch for while using this medicine? Visit your doctor for checks on your progress. This drug may make you feel generally unwell. This is not uncommon, as chemotherapy can affect healthy cells as well as cancer cells. Report any side effects. Continue your course of treatment even though you feel ill unless your doctor tells you to stop. Drink water or other fluids as directed. Urinate often, even at night. In some cases, you may be given additional medicines to help with side effects. Follow all directions for their use. Call your doctor or health care professional for advice if you get a fever, chills or sore throat, or other symptoms of a cold or flu. Do not treat yourself. This drug decreases your body's ability to fight infections. Try to avoid being around people who are sick. This medicine may increase your risk to bruise or bleed. Call your doctor or health care professional if you notice any unusual bleeding. Be careful brushing and flossing your teeth   or using a toothpick because you may get an infection or bleed more easily. If you have any dental work done, tell your dentist you are receiving this medicine. You may get drowsy or dizzy. Do not drive, use machinery, or do anything that needs mental alertness until you know how this medicine affects you. Do not become pregnant while taking this medicine or for 1 year after stopping it. Women should inform their doctor if they wish to become  pregnant or think they might be pregnant. Men should not father a child while taking this medicine and for 4 months after stopping it. There is a potential for serious side effects to an unborn child. Talk to your health care professional or pharmacist for more information. Do not breast-feed an infant while taking this medicine. This medicine may interfere with the ability to have a child. This medicine has caused ovarian failure in some women. This medicine has caused reduced sperm counts in some men. You should talk with your doctor or health care professional if you are concerned about your fertility. If you are going to have surgery, tell your doctor or health care professional that you have taken this medicine. What side effects may I notice from receiving this medicine? Side effects that you should report to your doctor or health care professional as soon as possible: -allergic reactions like skin rash, itching or hives, swelling of the face, lips, or tongue -low blood counts - this medicine may decrease the number of white blood cells, red blood cells and platelets. You may be at increased risk for infections and bleeding. -signs of infection - fever or chills, cough, sore throat, pain or difficulty passing urine -signs of decreased platelets or bleeding - bruising, pinpoint red spots on the skin, black, tarry stools, blood in the urine -signs of decreased red blood cells - unusually weak or tired, fainting spells, lightheadedness -breathing problems -dark urine -dizziness -palpitations -swelling of the ankles, feet, hands -trouble passing urine or change in the amount of urine -weight gain -yellowing of the eyes or skin Side effects that usually do not require medical attention (report to your doctor or health care professional if they continue or are bothersome): -changes in nail or skin color -hair loss -missed menstrual periods -mouth sores -nausea, vomiting This list may not  describe all possible side effects. Call your doctor for medical advice about side effects. You may report side effects to FDA at 1-800-FDA-1088. Where should I keep my medicine? This drug is given in a hospital or clinic and will not be stored at home. NOTE: This sheet is a summary. It may not cover all possible information. If you have questions about this medicine, talk to your doctor, pharmacist, or health care provider.  2019 Elsevier/Gold Standard (2012-09-08 16:22:58)    

## 2018-12-29 NOTE — Progress Notes (Signed)
Audio interpreter used to relay education about chemo and home medications. Pt confirmed (through interpreter) understanding of treatment. Printed education was given to the patient.

## 2019-01-01 ENCOUNTER — Inpatient Hospital Stay: Payer: BLUE CROSS/BLUE SHIELD

## 2019-01-01 ENCOUNTER — Telehealth: Payer: Self-pay | Admitting: Hematology and Oncology

## 2019-01-01 DIAGNOSIS — C50011 Malignant neoplasm of nipple and areola, right female breast: Secondary | ICD-10-CM

## 2019-01-01 DIAGNOSIS — Z170 Estrogen receptor positive status [ER+]: Principal | ICD-10-CM

## 2019-01-01 MED ORDER — PEGFILGRASTIM-CBQV 6 MG/0.6ML ~~LOC~~ SOSY
6.0000 mg | PREFILLED_SYRINGE | Freq: Once | SUBCUTANEOUS | Status: AC
  Administered 2019-01-01: 6 mg via SUBCUTANEOUS

## 2019-01-01 MED ORDER — PEGFILGRASTIM-CBQV 6 MG/0.6ML ~~LOC~~ SOSY
PREFILLED_SYRINGE | SUBCUTANEOUS | Status: AC
  Filled 2019-01-01: qty 0.6

## 2019-01-01 NOTE — Patient Instructions (Signed)
Pegfilgrastim injection  What is this medicine?  PEGFILGRASTIM (PEG fil gra stim) is a long-acting granulocyte colony-stimulating factor that stimulates the growth of neutrophils, a type of white blood cell important in the body's fight against infection. It is used to reduce the incidence of fever and infection in patients with certain types of cancer who are receiving chemotherapy that affects the bone marrow, and to increase survival after being exposed to high doses of radiation.  This medicine may be used for other purposes; ask your health care provider or pharmacist if you have questions.  COMMON BRAND NAME(S): Fulphila, Neulasta, UDENYCA  What should I tell my health care provider before I take this medicine?  They need to know if you have any of these conditions:  -kidney disease  -latex allergy  -ongoing radiation therapy  -sickle cell disease  -skin reactions to acrylic adhesives (On-Body Injector only)  -an unusual or allergic reaction to pegfilgrastim, filgrastim, other medicines, foods, dyes, or preservatives  -pregnant or trying to get pregnant  -breast-feeding  How should I use this medicine?  This medicine is for injection under the skin. If you get this medicine at home, you will be taught how to prepare and give the pre-filled syringe or how to use the On-body Injector. Refer to the patient Instructions for Use for detailed instructions. Use exactly as directed. Tell your healthcare provider immediately if you suspect that the On-body Injector may not have performed as intended or if you suspect the use of the On-body Injector resulted in a missed or partial dose.  It is important that you put your used needles and syringes in a special sharps container. Do not put them in a trash can. If you do not have a sharps container, call your pharmacist or healthcare provider to get one.  Talk to your pediatrician regarding the use of this medicine in children. While this drug may be prescribed for  selected conditions, precautions do apply.  Overdosage: If you think you have taken too much of this medicine contact a poison control center or emergency room at once.  NOTE: This medicine is only for you. Do not share this medicine with others.  What if I miss a dose?  It is important not to miss your dose. Call your doctor or health care professional if you miss your dose. If you miss a dose due to an On-body Injector failure or leakage, a new dose should be administered as soon as possible using a single prefilled syringe for manual use.  What may interact with this medicine?  Interactions have not been studied.  Give your health care provider a list of all the medicines, herbs, non-prescription drugs, or dietary supplements you use. Also tell them if you smoke, drink alcohol, or use illegal drugs. Some items may interact with your medicine.  This list may not describe all possible interactions. Give your health care provider a list of all the medicines, herbs, non-prescription drugs, or dietary supplements you use. Also tell them if you smoke, drink alcohol, or use illegal drugs. Some items may interact with your medicine.  What should I watch for while using this medicine?  You may need blood work done while you are taking this medicine.  If you are going to need a MRI, CT scan, or other procedure, tell your doctor that you are using this medicine (On-Body Injector only).  What side effects may I notice from receiving this medicine?  Side effects that you should report to   your doctor or health care professional as soon as possible:  -allergic reactions like skin rash, itching or hives, swelling of the face, lips, or tongue  -back pain  -dizziness  -fever  -pain, redness, or irritation at site where injected  -pinpoint red spots on the skin  -red or dark-brown urine  -shortness of breath or breathing problems  -stomach or side pain, or pain at the shoulder  -swelling  -tiredness  -trouble passing urine or  change in the amount of urine  Side effects that usually do not require medical attention (report to your doctor or health care professional if they continue or are bothersome):  -bone pain  -muscle pain  This list may not describe all possible side effects. Call your doctor for medical advice about side effects. You may report side effects to FDA at 1-800-FDA-1088.  Where should I keep my medicine?  Keep out of the reach of children.  If you are using this medicine at home, you will be instructed on how to store it. Throw away any unused medicine after the expiration date on the label.  NOTE: This sheet is a summary. It may not cover all possible information. If you have questions about this medicine, talk to your doctor, pharmacist, or health care provider.   2019 Elsevier/Gold Standard (2018-01-30 16:57:08)

## 2019-01-01 NOTE — Telephone Encounter (Signed)
No los °

## 2019-01-03 NOTE — Progress Notes (Signed)
Patient Care Team: Newton Pigg, MD as PCP - General (Obstetrics and Gynecology)  DIAGNOSIS:    ICD-10-CM   1. 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, estrogen receptor positive ([place])   09/30/2018 Surgery    Right mastectomy with axillary lymph node dissection in [place]: Grade 2 IDC, 5.3 cm, DCIS, margins negative, lymphovascular invasion present, 0/18 lymph nodes, ER 60%, PR 0%, HER-2 negative, Ki-67 90%, T3N0 stage II B    11/15/2018 Cancer Staging    Staging form: Breast, AJCC 8th Edition - Pathologic: Stage IIB (pT3, pN0, cM0, G2, ER+, PR-, HER2-) - Signed by Nicholas Lose, MD on 11/15/2018    11/29/2018 Oncotype testing    Oncotype DX score 28: 17% risk of distant recurrence at 9 years    12/08/2018 Genetic Testing    Negative genetic testing on the common hereditary cancer panel.  The Hereditary Gene Panel offered by [facility] includes sequencing and/or deletion duplication testing of the following 47 genes: APC, ATM, AXIN2, BARD1, BMPR1A, BRCA1, BRCA2, BRIP1, CDH1, CDK4, CDKN2A (p14ARF), CDKN2A (p16INK4a), CHEK2, CTNNA1, DICER1, EPCAM (Deletion/duplication testing only), GREM1 (promoter region deletion/duplication testing only), KIT, MEN1, MLH1, MSH2, MSH3, MSH6, MUTYH, NBN, NF1, NHTL1, PALB2, PDGFRA, PMS2, POLD1, POLE, PTEN, RAD50, RAD51C, RAD51D, SDHB, SDHC, SDHD, SMAD4, SMARCA4. STK11, TP53, TSC1, TSC2, and VHL.  The following genes were evaluated for sequence changes only: SDHA and HOXB13 c.251G>A variant only. The report date is December 08, 2018.    12/29/2018 -  Chemotherapy    The patient had DOXOrubicin (ADRIAMYCIN) chemo injection 94 mg, 60 mg/m2 = 94 mg, Intravenous,  Once, 1 of 4 cycles Administration: 94 mg (12/29/2018) palonosetron (ALOXI) injection 0.25 mg, 0.25 mg,  Intravenous,  Once, 1 of 4 cycles Administration: 0.25 mg (12/29/2018) pegfilgrastim-cbqv (UDENYCA) injection 6 mg, 6 mg, Subcutaneous, Once, 1 of 4 cycles cyclophosphamide (CYTOXAN) 940 mg in sodium chloride 0.9 % 250 mL chemo infusion, 600 mg/m2 = 940 mg, Intravenous,  Once, 1 of 4 cycles Administration: 940 mg (12/29/2018) PACLitaxel (TAXOL) 126 mg in sodium chloride 0.9 % 250 mL chemo infusion (</= 24m/m2), 80 mg/m2, Intravenous,  Once, 0 of 12 cycles fosaprepitant (EMEND) 150 mg, dexamethasone (DECADRON) 12 mg in sodium chloride 0.9 % 145 mL IVPB, , Intravenous,  Once, 1 of 4 cycles Administration:  (12/29/2018)  for chemotherapy treatment.      CHIEF COMPLIANT: Cycle 1 Day 7 Adriamycin and Cytoxan  INTERVAL HISTORY: Ariana Luna is a 43y.o. with above-mentioned history of right breast cancer [place] and who is currently receiving ajduvant chemotherapy with Taxotere and Cytoxan. She presents to the clinic today with two family members. A translator was used for the visit. She reports nausea on the first day following treatment for which compazine was helpful. She has been fatigued and lost her appetite due to nausea and vomiting after eating. She reports a severe headache, for which she has not taken any medication, which worsens when she lays down at night. She notes mild bone pain and mouth sores and denies constipation. Her lab work from today shows: WBC 5.6, Hg 12.2, platelets 123.   REVIEW OF SYSTEMS:   Constitutional: Denies fevers, chills or abnormal weight loss (+) headache (+) fatigue (+) loss of appetite Eyes: Denies blurriness of vision  Ears, nose, mouth, throat, and face: Denies mucositis or sore throat (+) mouth sores Respiratory: Denies cough, dyspnea or wheezes Cardiovascular: Denies palpitation, chest discomfort Gastrointestinal: Denies heartburn or change in bowel habits (+) nausea (+) vomiting Skin: Denies abnormal skin  rashes Lymphatics: Denies new lymphadenopathy or easy bruising Neurological: Denies numbness, tingling or new weaknesses Behavioral/Psych: Mood is stable, no new changes  Extremities: No lower extremity edema Breast: denies any pain or lumps or nodules in either breasts All other systems were reviewed with the patient and are negative.  I have reviewed the past medical history, past surgical history, social history and family history with the patient and they are unchanged from previous note.  ALLERGIES:  has no allergies on file.  MEDICATIONS:  Current Outpatient Medications  Medication Sig Dispense Refill  . dexamethasone (DECADRON) 4 MG tablet Take 1 tablet (4 mg total) by mouth daily. Take 1 tablet day after chemo and 1 tablet 2 days after chemo with food 8 tablet 0  . lidocaine-prilocaine (EMLA) cream Apply 1 application topically as needed. 30 g 0  . lidocaine-prilocaine (EMLA) cream Apply to affected area once 30 g 3  . LORazepam (ATIVAN) 0.5 MG tablet Take 1 tablet (0.5 mg total) by mouth at bedtime as needed for sleep. 30 tablet 0  . ondansetron (ZOFRAN) 8 MG tablet Take 1 tablet (8 mg total) by mouth 2 (two) times daily as needed. Start on the third day after chemotherapy. 30 tablet 1  . prochlorperazine (COMPAZINE) 10 MG tablet Take 1 tablet (10 mg total) by mouth every 6 (six) hours as needed (Nausea or vomiting). 30 tablet 1   No current facility-administered medications for this visit.     PHYSICAL EXAMINATION: ECOG PERFORMANCE STATUS: 1 - Symptomatic but completely ambulatory  Vitals:   01/04/19 1513  BP: 100/72  Pulse: 78  Resp: 18  Temp: 98 F (36.7 C)  SpO2: 100%   Filed Weights   01/04/19 1513  Weight: 120 lb (54.4 kg)    GENERAL: alert, no distress and comfortable SKIN: skin color, texture, turgor are normal, no rashes or significant lesions EYES: normal, Conjunctiva are pink and non-injected, sclera clear OROPHARYNX: no exudate, no erythema and lips,  buccal mucosa, and tongue normal  NECK: supple, thyroid normal size, non-tender, without nodularity LYMPH: no palpable lymphadenopathy in the cervical, axillary or inguinal LUNGS: clear to auscultation and percussion with normal breathing effort HEART: regular rate & rhythm and no murmurs and no lower extremity edema ABDOMEN: abdomen soft, non-tender and normal bowel sounds MUSCULOSKELETAL: no cyanosis of digits and no clubbing  NEURO: alert & oriented x 3 with fluent speech, no focal motor/sensory deficits EXTREMITIES: No lower extremity edema  LABORATORY DATA:  I have reviewed the data as listed CMP Latest Ref Rng & Units 12/29/2018 12/28/2018  Glucose 70 - 99 mg/dL 119(H) 90  BUN 6 - 20 mg/dL 9 13  Creatinine 0.44 - 1.00 mg/dL 0.77 0.72  Sodium 135 - 145 mmol/L 139 136  Potassium 3.5 - 5.1 mmol/L 3.6 3.9  Chloride 98 - 111 mmol/L 105 104  CO2 22 - 32 mmol/L 23 26  Calcium 8.9 - 10.3 mg/dL 9.2 8.9  Total Protein 6.5 - 8.1 g/dL 7.8 -  Total Bilirubin 0.3 - 1.2 mg/dL 0.9 -  Alkaline Phos 38 - 126 U/L 83 -  AST 15 - 41 U/L 18 -  ALT 0 - 44 U/L 31 -    Lab Results  Component Value Date   WBC  5.6 01/04/2019   HGB 12.2 01/04/2019   HCT 36.2 01/04/2019   MCV 84.0 01/04/2019   PLT 123 (L) 01/04/2019   NEUTROABS PENDING 01/04/2019    ASSESSMENT & PLAN:  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 Adjuvant radiation therapy because of the tumor size being large 3. Followed by adjuvant antiestrogen therapy with tamoxifen 20 mg daily x10  years ------------------------------------------------------------------------------------------------------------------- Current Treatment: Cycle 1 day 1 Adriamycin and Cytoxan ECHO feb 2020: EF 60-65% Labs reviewed  Chemo toxicities: 1.  Nausea and vomiting: I instructed him to be more proactive and take antiemetics more often. 2.  Headache: Could be related to Zofran. 3.  Fatigue as expected from chemo. Return to clinic in 1 week for cycle 2    No orders of the defined types were placed in this encounter.  The patient has a good understanding of the overall plan. she agrees with it. she will call with any problems that may develop before the next visit here.  Nicholas Lose, MD 01/04/2019  Julious Oka Dorshimer am acting as scribe for Dr. Nicholas Lose.  I have reviewed the above documentation for accuracy and completeness, and I agree with the above.

## 2019-01-04 ENCOUNTER — Inpatient Hospital Stay (HOSPITAL_BASED_OUTPATIENT_CLINIC_OR_DEPARTMENT_OTHER): Payer: BLUE CROSS/BLUE SHIELD | Admitting: Hematology and Oncology

## 2019-01-04 ENCOUNTER — Inpatient Hospital Stay: Payer: BLUE CROSS/BLUE SHIELD

## 2019-01-04 DIAGNOSIS — C50011 Malignant neoplasm of nipple and areola, right female breast: Secondary | ICD-10-CM

## 2019-01-04 DIAGNOSIS — R112 Nausea with vomiting, unspecified: Secondary | ICD-10-CM

## 2019-01-04 DIAGNOSIS — Z170 Estrogen receptor positive status [ER+]: Principal | ICD-10-CM

## 2019-01-04 DIAGNOSIS — Z95828 Presence of other vascular implants and grafts: Secondary | ICD-10-CM

## 2019-01-04 DIAGNOSIS — Z7689 Persons encountering health services in other specified circumstances: Secondary | ICD-10-CM | POA: Diagnosis not present

## 2019-01-04 DIAGNOSIS — Z9011 Acquired absence of right breast and nipple: Secondary | ICD-10-CM

## 2019-01-04 DIAGNOSIS — Z5111 Encounter for antineoplastic chemotherapy: Secondary | ICD-10-CM

## 2019-01-04 DIAGNOSIS — Z79899 Other long term (current) drug therapy: Secondary | ICD-10-CM

## 2019-01-04 DIAGNOSIS — R51 Headache: Secondary | ICD-10-CM

## 2019-01-04 DIAGNOSIS — R5383 Other fatigue: Secondary | ICD-10-CM

## 2019-01-04 LAB — CBC WITH DIFFERENTIAL (CANCER CENTER ONLY)
Abs Immature Granulocytes: 0 10*3/uL (ref 0.00–0.07)
Band Neutrophils: 4 %
Basophils Absolute: 0 10*3/uL (ref 0.0–0.1)
Basophils Relative: 0 %
Eosinophils Absolute: 0.1 10*3/uL (ref 0.0–0.5)
Eosinophils Relative: 1 %
HEMATOCRIT: 36.2 % (ref 36.0–46.0)
Hemoglobin: 12.2 g/dL (ref 12.0–15.0)
LYMPHS PCT: 10 %
Lymphs Abs: 0.6 10*3/uL — ABNORMAL LOW (ref 0.7–4.0)
MCH: 28.3 pg (ref 26.0–34.0)
MCHC: 33.7 g/dL (ref 30.0–36.0)
MCV: 84 fL (ref 80.0–100.0)
Monocytes Absolute: 0 10*3/uL — ABNORMAL LOW (ref 0.1–1.0)
Monocytes Relative: 0 %
Neutro Abs: 5 10*3/uL (ref 1.7–17.7)
Neutrophils Relative %: 85 %
Platelet Count: 123 10*3/uL — ABNORMAL LOW (ref 150–400)
RBC: 4.31 MIL/uL (ref 3.87–5.11)
RDW: 12.1 % (ref 11.5–15.5)
WBC Count: 5.6 10*3/uL (ref 4.0–10.5)
nRBC: 0 % (ref 0.0–0.2)

## 2019-01-04 LAB — CMP (CANCER CENTER ONLY)
ALT: 30 U/L (ref 0–44)
AST: 17 U/L (ref 15–41)
Albumin: 3.9 g/dL (ref 3.5–5.0)
Alkaline Phosphatase: 84 U/L (ref 38–126)
Anion gap: 8 (ref 5–15)
BUN: 14 mg/dL (ref 6–20)
CO2: 26 mmol/L (ref 22–32)
Calcium: 8.9 mg/dL (ref 8.9–10.3)
Chloride: 102 mmol/L (ref 98–111)
Creatinine: 0.66 mg/dL (ref 0.44–1.00)
GFR, Est AFR Am: >60 mL/min (ref >60)
GFR, Estimated: >60 mL/min (ref >60)
Glucose, Bld: 96 mg/dL (ref 70–99)
Potassium: 3.9 mmol/L (ref 3.5–5.1)
Sodium: 136 mmol/L (ref 135–145)
Total Bilirubin: 0.6 mg/dL (ref 0.3–1.2)
Total Protein: 7.5 g/dL (ref 6.5–8.1)

## 2019-01-04 MED ORDER — SODIUM CHLORIDE 0.9% FLUSH
10.0000 mL | INTRAVENOUS | Status: DC | PRN
  Administered 2019-01-04: 10 mL
  Filled 2019-01-04: qty 10

## 2019-01-04 MED ORDER — HEPARIN SOD (PORK) LOCK FLUSH 100 UNIT/ML IV SOLN
500.0000 [IU] | Freq: Once | INTRAVENOUS | Status: AC | PRN
  Administered 2019-01-04: 500 [IU]
  Filled 2019-01-04: qty 5

## 2019-01-04 NOTE — Assessment & Plan Note (Signed)
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 Adjuvant radiation therapy because of the tumor size being large 3. Followed by adjuvant antiestrogen therapy with tamoxifen 20 mg daily x10 years ------------------------------------------------------------------------------------------------------------------- Current Treatment: Cycle 1 day 1 Adriamycin and Cytoxan ECHO feb 2020: EF 60-65% Labs reviewed  Chemo toxicities: 1.  Nausea and vomiting: I instructed him to be more proactive and take antiemetics more often. 2.  Headache: Could be related to Zofran. 3.  Fatigue as expected from chemo. Return to clinic in 1 week for cycle 2

## 2019-01-11 NOTE — Progress Notes (Signed)
Patient Care Team: Newton Pigg, MD as PCP - General (Obstetrics and Gynecology)  DIAGNOSIS:    ICD-10-CM   1. 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, estrogen receptor positive ([place])   09/30/2018 Surgery    Right mastectomy with axillary lymph node dissection in [place]: Grade 2 IDC, 5.3 cm, DCIS, margins negative, lymphovascular invasion present, 0/18 lymph nodes, ER 60%, PR 0%, HER-2 negative, Ki-67 90%, T3N0 stage II B    11/15/2018 Cancer Staging    Staging form: Breast, AJCC 8th Edition - Pathologic: Stage IIB (pT3, pN0, cM0, G2, ER+, PR-, HER2-) - Signed by Nicholas Lose, MD on 11/15/2018    11/29/2018 Oncotype testing    Oncotype DX score 28: 17% risk of distant recurrence at 9 years    12/08/2018 Genetic Testing    Negative genetic testing on the common hereditary cancer panel.  The Hereditary Gene Panel offered by [facility] includes sequencing and/or deletion duplication testing of the following 47 genes: APC, ATM, AXIN2, BARD1, BMPR1A, BRCA1, BRCA2, BRIP1, CDH1, CDK4, CDKN2A (p14ARF), CDKN2A (p16INK4a), CHEK2, CTNNA1, DICER1, EPCAM (Deletion/duplication testing only), GREM1 (promoter region deletion/duplication testing only), KIT, MEN1, MLH1, MSH2, MSH3, MSH6, MUTYH, NBN, NF1, NHTL1, PALB2, PDGFRA, PMS2, POLD1, POLE, PTEN, RAD50, RAD51C, RAD51D, SDHB, SDHC, SDHD, SMAD4, SMARCA4. STK11, TP53, TSC1, TSC2, and VHL.  The following genes were evaluated for sequence changes only: SDHA and HOXB13 c.251G>A variant only. The report date is December 08, 2018.    12/29/2018 -  Chemotherapy    The patient had DOXOrubicin (ADRIAMYCIN) chemo injection 94 mg, 60 mg/m2 = 94 mg, Intravenous,  Once, 1 of 4 cycles Administration: 94 mg (12/29/2018) palonosetron (ALOXI) injection 0.25 mg, 0.25 mg,  Intravenous,  Once, 1 of 4 cycles Administration: 0.25 mg (12/29/2018) pegfilgrastim-cbqv (UDENYCA) injection 6 mg, 6 mg, Subcutaneous, Once, 1 of 4 cycles Administration: 6 mg (01/01/2019) cyclophosphamide (CYTOXAN) 940 mg in sodium chloride 0.9 % 250 mL chemo infusion, 600 mg/m2 = 940 mg, Intravenous,  Once, 1 of 4 cycles Administration: 940 mg (12/29/2018) PACLitaxel (TAXOL) 126 mg in sodium chloride 0.9 % 250 mL chemo infusion (</= 43m/m2), 80 mg/m2, Intravenous,  Once, 0 of 12 cycles fosaprepitant (EMEND) 150 mg, dexamethasone (DECADRON) 12 mg in sodium chloride 0.9 % 145 mL IVPB, , Intravenous,  Once, 1 of 4 cycles Administration:  (12/29/2018)  for chemotherapy treatment.      CHIEF COMPLIANT: Cycle 2 Adriamycin and Cytoxan  INTERVAL HISTORY: Ariana Luna is a 43y.o. with above-mentioned history of right breast cancer [place] and who is currently receiving ajduvant chemotherapy with dose dense Adriamycin and Cytoxan. She presents to the clinic today with 2 family members for cycle 2. An interpreter was used for the visit. She reports her last treatment went better and denies nausea or diarrhea, and notes normal appetite despite loss of taste. She had mouth sores for the week following treatment. She took migraine medication this morning as Tylenol provided no improvement. Her hair is beginning to fall out. Her labs from today show: WBC 9.3, Hg 12.5, platelets 185.  REVIEW OF SYSTEMS:   Constitutional: Denies fevers, chills or abnormal weight loss (+) loss of taste (+) migraines (+) hair loss Eyes: Denies blurriness of vision Ears, nose, mouth, throat, and face: Denies mucositis  or sore throat (+) mouth sores Respiratory: Denies cough, dyspnea or wheezes Cardiovascular: Denies palpitation, chest discomfort Gastrointestinal: Denies nausea, heartburn or change in bowel habits Skin: Denies abnormal skin rashes Lymphatics: Denies new lymphadenopathy  or easy bruising Neurological: Denies numbness, tingling or new weaknesses Behavioral/Psych: Mood is stable, no new changes  Extremities: No lower extremity edema Breast: denies any pain or lumps or nodules in either breasts All other systems were reviewed with the patient and are negative.  I have reviewed the past medical history, past surgical history, social history and family history with the patient and they are unchanged from previous note.  ALLERGIES:  has no allergies on file.  MEDICATIONS:  Current Outpatient Medications  Medication Sig Dispense Refill  . dexamethasone (DECADRON) 4 MG tablet Take 1 tablet (4 mg total) by mouth daily. Take 1 tablet day after chemo and 1 tablet 2 days after chemo with food 8 tablet 0  . lidocaine-prilocaine (EMLA) cream Apply 1 application topically as needed. 30 g 0  . lidocaine-prilocaine (EMLA) cream Apply to affected area once 30 g 3  . LORazepam (ATIVAN) 0.5 MG tablet Take 1 tablet (0.5 mg total) by mouth at bedtime as needed for sleep. 30 tablet 0  . ondansetron (ZOFRAN) 8 MG tablet Take 1 tablet (8 mg total) by mouth 2 (two) times daily as needed. Start on the third day after chemotherapy. 30 tablet 1  . prochlorperazine (COMPAZINE) 10 MG tablet Take 1 tablet (10 mg total) by mouth every 6 (six) hours as needed (Nausea or vomiting). 30 tablet 1   No current facility-administered medications for this visit.    Facility-Administered Medications Ordered in Other Visits  Medication Dose Route Frequency Provider Last Rate Last Dose  . sodium chloride flush (NS) 0.9 % injection 10 mL  10 mL Intracatheter PRN Nicholas Lose, MD   10 mL at 01/12/19 1032    PHYSICAL EXAMINATION: ECOG PERFORMANCE STATUS: 1 - Symptomatic but completely ambulatory  Vitals:   01/12/19 1051  BP: 97/64  Pulse: 79  Resp: 18  Temp: 97.8 F (36.6 C)  SpO2: 100%   Filed Weights   01/12/19 1051  Weight: 120 lb 8 oz (54.7 kg)    GENERAL: alert, no distress and  comfortable SKIN: skin color, texture, turgor are normal, no rashes or significant lesions EYES: normal, Conjunctiva are pink and non-injected, sclera clear OROPHARYNX: no exudate, no erythema and lips, buccal mucosa, and tongue normal  NECK: supple, thyroid normal size, non-tender, without nodularity LYMPH: no palpable lymphadenopathy in the cervical, axillary or inguinal LUNGS: clear to auscultation and percussion with normal breathing effort HEART: regular rate & rhythm and no murmurs and no lower extremity edema ABDOMEN: abdomen soft, non-tender and normal bowel sounds MUSCULOSKELETAL: no cyanosis of digits and no clubbing  NEURO: alert & oriented x 3 with fluent speech, no focal motor/sensory deficits EXTREMITIES: No lower extremity edema  LABORATORY DATA:  I have reviewed the data as listed CMP Latest Ref Rng & Units 01/12/2019 01/04/2019 12/29/2018  Glucose 70 - 99 mg/dL 79 96 119(H)  BUN 6 - 20 mg/dL 9 14 9   Creatinine 0.44 - 1.00 mg/dL 0.87 0.66 0.77  Sodium 135 - 145 mmol/L 138 136 139  Potassium 3.5 - 5.1 mmol/L 3.8 3.9 3.6  Chloride 98 - 111 mmol/L 103 102 105  CO2 22 - 32 mmol/L 27 26 23   Calcium 8.9 - 10.3 mg/dL 9.3 8.9 9.2  Total Protein 6.5 - 8.1 g/dL 7.6 7.5 7.8  Total Bilirubin 0.3 - 1.2 mg/dL 0.2(L) 0.6 0.9  Alkaline Phos 38 - 126 U/L 80 84 83  AST 15 - 41 U/L 21 17 18   ALT 0 - 44 U/L 24 30 31     Lab Results  Component Value Date   WBC 9.3 01/12/2019   HGB 12.5 01/12/2019   HCT 38.3 01/12/2019   MCV 85.9 01/12/2019   PLT 185 01/12/2019   NEUTROABS 6.3 01/12/2019    ASSESSMENT & PLAN:  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, estrogen receptor positive ([place]) 09/30/2018:Right mastectomy with axillary lymph node dissection in [place]: Grade 2 IDC, 5.3 cm, DCIS, margins negative, lymphovascular invasion present, 0/18 lymph nodes, ER 60%, PR 0%, HER-2 negative, Ki-67 90%, T3N0 stage II B Oncotype DX: 28: High  risk  Treatment plan: 1.Adjuvant chemotherapy withdose dense Adriamycin andCytoxan x4 followed by Taxol weekly x12 2. Adjuvant radiation therapy because of the tumor size being large 3. Followed by adjuvant antiestrogen therapy with tamoxifen 20 mg daily x10 years ------------------------------------------------------------------------------------------------------------------- Current Treatment: Cycle 2 day 1 Adriamycin and Cytoxan ECHO feb 2020: EF 60-65% Labs reviewed  Chemo toxicities: 1.  Nausea and vomiting: I instructed him to be more proactive and take antiemetics more often.  Today she feels much better 2.  Headache: Could be related to Zofran versus migraine. 3.  Fatigue as expected from chemo. 4.  Loss of taste 5.  Hair loss Return to clinic in 2 weeks for cycle 3    No orders of the defined types were placed in this encounter.  The patient has a good understanding of the overall plan. she agrees with it. she will call with any problems that may develop before the next visit here.  Nicholas Lose, MD 01/12/2019  Julious Oka Dorshimer am acting as scribe for Dr. Nicholas Lose.  I have reviewed the above documentation for accuracy and completeness, and I agree with the above.

## 2019-01-12 ENCOUNTER — Inpatient Hospital Stay: Payer: BLUE CROSS/BLUE SHIELD | Attending: Hematology and Oncology

## 2019-01-12 ENCOUNTER — Inpatient Hospital Stay: Payer: BLUE CROSS/BLUE SHIELD

## 2019-01-12 ENCOUNTER — Inpatient Hospital Stay (HOSPITAL_BASED_OUTPATIENT_CLINIC_OR_DEPARTMENT_OTHER): Payer: BLUE CROSS/BLUE SHIELD | Admitting: Hematology and Oncology

## 2019-01-12 DIAGNOSIS — R5383 Other fatigue: Secondary | ICD-10-CM | POA: Insufficient documentation

## 2019-01-12 DIAGNOSIS — Z7689 Persons encountering health services in other specified circumstances: Secondary | ICD-10-CM

## 2019-01-12 DIAGNOSIS — Z170 Estrogen receptor positive status [ER+]: Secondary | ICD-10-CM

## 2019-01-12 DIAGNOSIS — C50011 Malignant neoplasm of nipple and areola, right female breast: Secondary | ICD-10-CM

## 2019-01-12 DIAGNOSIS — Z9011 Acquired absence of right breast and nipple: Secondary | ICD-10-CM

## 2019-01-12 DIAGNOSIS — Z95828 Presence of other vascular implants and grafts: Secondary | ICD-10-CM

## 2019-01-12 DIAGNOSIS — Z79899 Other long term (current) drug therapy: Secondary | ICD-10-CM

## 2019-01-12 DIAGNOSIS — R112 Nausea with vomiting, unspecified: Secondary | ICD-10-CM | POA: Insufficient documentation

## 2019-01-12 DIAGNOSIS — Z923 Personal history of irradiation: Secondary | ICD-10-CM | POA: Insufficient documentation

## 2019-01-12 DIAGNOSIS — R51 Headache: Secondary | ICD-10-CM

## 2019-01-12 DIAGNOSIS — Z5111 Encounter for antineoplastic chemotherapy: Secondary | ICD-10-CM | POA: Insufficient documentation

## 2019-01-12 LAB — COMPREHENSIVE METABOLIC PANEL
ALT: 24 U/L (ref 0–44)
AST: 21 U/L (ref 15–41)
Albumin: 4.3 g/dL (ref 3.5–5.0)
Alkaline Phosphatase: 80 U/L (ref 38–126)
Anion gap: 8 (ref 5–15)
BUN: 9 mg/dL (ref 6–20)
CO2: 27 mmol/L (ref 22–32)
Calcium: 9.3 mg/dL (ref 8.9–10.3)
Chloride: 103 mmol/L (ref 98–111)
Creatinine, Ser: 0.87 mg/dL (ref 0.44–1.00)
GFR calc Af Amer: >60 mL/min (ref >60)
GFR calc non Af Amer: >60 mL/min (ref >60)
Glucose, Bld: 79 mg/dL (ref 70–99)
Potassium: 3.8 mmol/L (ref 3.5–5.1)
Sodium: 138 mmol/L (ref 135–145)
TOTAL PROTEIN: 7.6 g/dL (ref 6.5–8.1)
Total Bilirubin: 0.2 mg/dL — ABNORMAL LOW (ref 0.3–1.2)

## 2019-01-12 LAB — CBC WITH DIFFERENTIAL (CANCER CENTER ONLY)
Abs Immature Granulocytes: 0.64 10*3/uL — ABNORMAL HIGH (ref 0.00–0.07)
Basophils Absolute: 0.1 10*3/uL (ref 0.0–0.1)
Basophils Relative: 1 %
EOS ABS: 0 10*3/uL (ref 0.0–0.5)
Eosinophils Relative: 0 %
HEMATOCRIT: 38.3 % (ref 36.0–46.0)
Hemoglobin: 12.5 g/dL (ref 12.0–15.0)
Immature Granulocytes: 7 %
Lymphocytes Relative: 16 %
Lymphs Abs: 1.4 10*3/uL (ref 0.7–4.0)
MCH: 28 pg (ref 26.0–34.0)
MCHC: 32.6 g/dL (ref 30.0–36.0)
MCV: 85.9 fL (ref 80.0–100.0)
Monocytes Absolute: 0.8 10*3/uL (ref 0.1–1.0)
Monocytes Relative: 9 %
NEUTROS PCT: 67 %
Neutro Abs: 6.3 10*3/uL (ref 1.7–7.7)
Platelet Count: 185 10*3/uL (ref 150–400)
RBC: 4.46 MIL/uL (ref 3.87–5.11)
RDW: 12.5 % (ref 11.5–15.5)
WBC Count: 9.3 10*3/uL (ref 4.0–10.5)
nRBC: 0 % (ref 0.0–0.2)

## 2019-01-12 MED ORDER — SODIUM CHLORIDE 0.9 % IV SOLN
600.0000 mg/m2 | Freq: Once | INTRAVENOUS | Status: AC
  Administered 2019-01-12: 940 mg via INTRAVENOUS
  Filled 2019-01-12: qty 47

## 2019-01-12 MED ORDER — HEPARIN SOD (PORK) LOCK FLUSH 100 UNIT/ML IV SOLN
500.0000 [IU] | Freq: Once | INTRAVENOUS | Status: AC | PRN
  Administered 2019-01-12: 500 [IU]
  Filled 2019-01-12: qty 5

## 2019-01-12 MED ORDER — DOXORUBICIN HCL CHEMO IV INJECTION 2 MG/ML
60.0000 mg/m2 | Freq: Once | INTRAVENOUS | Status: AC
  Administered 2019-01-12: 94 mg via INTRAVENOUS
  Filled 2019-01-12: qty 47

## 2019-01-12 MED ORDER — SODIUM CHLORIDE 0.9 % IV SOLN
Freq: Once | INTRAVENOUS | Status: AC
  Administered 2019-01-12: 12:00:00 via INTRAVENOUS
  Filled 2019-01-12: qty 250

## 2019-01-12 MED ORDER — SODIUM CHLORIDE 0.9% FLUSH
10.0000 mL | INTRAVENOUS | Status: DC | PRN
  Administered 2019-01-12: 10 mL
  Filled 2019-01-12: qty 10

## 2019-01-12 MED ORDER — SODIUM CHLORIDE 0.9 % IV SOLN
Freq: Once | INTRAVENOUS | Status: AC
  Administered 2019-01-12: 12:00:00 via INTRAVENOUS
  Filled 2019-01-12: qty 5

## 2019-01-12 MED ORDER — PALONOSETRON HCL INJECTION 0.25 MG/5ML
0.2500 mg | Freq: Once | INTRAVENOUS | Status: AC
  Administered 2019-01-12: 0.25 mg via INTRAVENOUS

## 2019-01-12 MED ORDER — PALONOSETRON HCL INJECTION 0.25 MG/5ML
INTRAVENOUS | Status: AC
  Filled 2019-01-12: qty 5

## 2019-01-12 NOTE — Progress Notes (Signed)
01/12/19  Patient with headache post cycle #1 spoke with Dr Lindi Adie. Ok to increase fluid for cyclophosphamide to 500 ml and give over 60 minutes.  T.O. Dr Jannifer Hick, PharmD

## 2019-01-12 NOTE — Assessment & Plan Note (Signed)
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 Adjuvant radiation therapy because of the tumor size being large 3. Followed by adjuvant antiestrogen therapy with tamoxifen 20 mg daily x10 years ------------------------------------------------------------------------------------------------------------------- Current Treatment: Cycle 2 day 1 Adriamycin and Cytoxan ECHO feb 2020: EF 60-65% Labs reviewed  Chemo toxicities: 1.  Nausea and vomiting: I instructed him to be more proactive and take antiemetics more often. 2.  Headache: Could be related to Zofran. 3.  Fatigue as expected from chemo.  Return to clinic in 2 weeks for cycle 3

## 2019-01-12 NOTE — Patient Instructions (Signed)
[facility] Discharge Instructions for Patients Receiving Chemotherapy  Today you received the following chemotherapy agents: doxorubicin (Adriamycin) and cyclophosphamide (Cytoxan).   To help prevent nausea and vomiting after your treatment, we encourage you to take your nausea medication as prescribed by your physician.    If you develop nausea and vomiting that is not controlled by your nausea medication, call the clinic.   BELOW ARE SYMPTOMS THAT SHOULD BE REPORTED IMMEDIATELY:  *FEVER GREATER THAN 100.5 F  *CHILLS WITH OR WITHOUT FEVER  NAUSEA AND VOMITING THAT IS NOT CONTROLLED WITH YOUR NAUSEA MEDICATION  *UNUSUAL SHORTNESS OF BREATH  *UNUSUAL BRUISING OR BLEEDING  TENDERNESS IN MOUTH AND THROAT WITH OR WITHOUT PRESENCE OF ULCERS  *URINARY PROBLEMS  *BOWEL PROBLEMS  UNUSUAL RASH Items with * indicate a potential emergency and should be followed up as soon as possible.  Feel free to call the clinic should you have any questions or concerns. The clinic phone number is [phone].  Please show the CHEMO ALERT CARD at check-in to the Emergency Department and triage nurse.  Doxorubicin injection What is this medicine? DOXORUBICIN (dox oh ROO bi sin) is a chemotherapy drug. It is used to treat many kinds of cancer like leukemia, lymphoma, neuroblastoma, sarcoma, and Wilms' tumor. It is also used to treat bladder cancer, breast cancer, lung cancer, ovarian cancer, stomach cancer, and thyroid cancer. This medicine may be used for other purposes; ask your health care provider or pharmacist if you have questions. COMMON BRAND NAME(S): Adriamycin, Adriamycin PFS, Adriamycin RDF, Rubex What should I tell my health care provider before I take this medicine? They need to know if you have any of these conditions: -heart disease -history of low blood counts caused by a medicine -liver disease -recent or ongoing radiation therapy -an unusual or allergic reaction  to doxorubicin, other chemotherapy agents, other medicines, foods, dyes, or preservatives -pregnant or trying to get pregnant -breast-feeding How should I use this medicine? This drug is given as an infusion into a vein. It is administered in a hospital or clinic by a specially trained health care professional. If you have pain, swelling, burning or any unusual feeling around the site of your injection, tell your health care professional right away. Talk to your pediatrician regarding the use of this medicine in children. Special care may be needed. Overdosage: If you think you have taken too much of this medicine contact a poison control center or emergency room at once. NOTE: This medicine is only for you. Do not share this medicine with others. What if I miss a dose? It is important not to miss your dose. Call your doctor or health care professional if you are unable to keep an appointment. What may interact with this medicine? This medicine may interact with the following medications: -6-mercaptopurine -paclitaxel -phenytoin -St. John's Wort -trastuzumab -verapamil This list may not describe all possible interactions. Give your health care provider a list of all the medicines, herbs, non-prescription drugs, or dietary supplements you use. Also tell them if you smoke, drink alcohol, or use illegal drugs. Some items may interact with your medicine. What should I watch for while using this medicine? This drug may make you feel generally unwell. This is not uncommon, as chemotherapy can affect healthy cells as well as cancer cells. Report any side effects. Continue your course of treatment even though you feel ill unless your doctor tells you to stop. There is a maximum amount of this medicine you should receive   throughout your life. The amount depends on the medical condition being treated and your overall health. Your doctor will watch how much of this medicine you receive in your lifetime.  Tell your doctor if you have taken this medicine before. You may need blood work done while you are taking this medicine. Your urine may turn red for a few days after your dose. This is not blood. If your urine is dark or brown, call your doctor. In some cases, you may be given additional medicines to help with side effects. Follow all directions for their use. Call your doctor or health care professional for advice if you get a fever, chills or sore throat, or other symptoms of a cold or flu. Do not treat yourself. This drug decreases your body's ability to fight infections. Try to avoid being around people who are sick. This medicine may increase your risk to bruise or bleed. Call your doctor or health care professional if you notice any unusual bleeding. Talk to your doctor about your risk of cancer. You may be more at risk for certain types of cancers if you take this medicine. Do not become pregnant while taking this medicine or for 6 months after stopping it. Women should inform their doctor if they wish to become pregnant or think they might be pregnant. Men should not father a child while taking this medicine and for 6 months after stopping it. There is a potential for serious side effects to an unborn child. Talk to your health care professional or pharmacist for more information. Do not breast-feed an infant while taking this medicine. This medicine has caused ovarian failure in some women and reduced sperm counts in some men This medicine may interfere with the ability to have a child. Talk with your doctor or health care professional if you are concerned about your fertility. This medicine may cause a decrease in Co-Enzyme Q-10. You should make sure that you get enough Co-Enzyme Q-10 while you are taking this medicine. Discuss the foods you eat and the vitamins you take with your health care professional. What side effects may I notice from receiving this medicine? Side effects that you should  report to your doctor or health care professional as soon as possible: -allergic reactions like skin rash, itching or hives, swelling of the face, lips, or tongue -breathing problems -chest pain -fast or irregular heartbeat -low blood counts - this medicine may decrease the number of white blood cells, red blood cells and platelets. You may be at increased risk for infections and bleeding. -pain, redness, or irritation at site where injected -signs of infection - fever or chills, cough, sore throat, pain or difficulty passing urine -signs of decreased platelets or bleeding - bruising, pinpoint red spots on the skin, black, tarry stools, blood in the urine -swelling of the ankles, feet, hands -tiredness -weakness Side effects that usually do not require medical attention (report to your doctor or health care professional if they continue or are bothersome): -diarrhea -hair loss -mouth sores -nail discoloration or damage -nausea -red colored urine -vomiting This list may not describe all possible side effects. Call your doctor for medical advice about side effects. You may report side effects to FDA at 1-800-FDA-1088. Where should I keep my medicine? This drug is given in a hospital or clinic and will not be stored at home. NOTE: This sheet is a summary. It may not cover all possible information. If you have questions about this medicine, talk to your doctor,   pharmacist, or health care provider.  2019 Elsevier/Gold Standard (2017-06-08 11:01:26)  Cyclophosphamide injection What is this medicine? CYCLOPHOSPHAMIDE (sye kloe FOSS fa mide) is a chemotherapy drug. It slows the growth of cancer cells. This medicine is used to treat many types of cancer like lymphoma, myeloma, leukemia, breast cancer, and ovarian cancer, to name a few. This medicine may be used for other purposes; ask your health care provider or pharmacist if you have questions. COMMON BRAND NAME(S): Cytoxan, Neosar What  should I tell my health care provider before I take this medicine? They need to know if you have any of these conditions: -blood disorders -history of other chemotherapy -infection -kidney disease -liver disease -recent or ongoing radiation therapy -tumors in the bone marrow -an unusual or allergic reaction to cyclophosphamide, other chemotherapy, other medicines, foods, dyes, or preservatives -pregnant or trying to get pregnant -breast-feeding How should I use this medicine? This drug is usually given as an injection into a vein or muscle or by infusion into a vein. It is administered in a hospital or clinic by a specially trained health care professional. Talk to your pediatrician regarding the use of this medicine in children. Special care may be needed. Overdosage: If you think you have taken too much of this medicine contact a poison control center or emergency room at once. NOTE: This medicine is only for you. Do not share this medicine with others. What if I miss a dose? It is important not to miss your dose. Call your doctor or health care professional if you are unable to keep an appointment. What may interact with this medicine? This medicine may interact with the following medications: -amiodarone -amphotericin B -azathioprine -certain antiviral medicines for HIV or AIDS such as protease inhibitors (e.g., indinavir, ritonavir) and zidovudine -certain blood pressure medications such as benazepril, captopril, enalapril, fosinopril, lisinopril, moexipril, monopril, perindopril, quinapril, ramipril, trandolapril -certain cancer medications such as anthracyclines (e.g., daunorubicin, doxorubicin), busulfan, cytarabine, paclitaxel, pentostatin, tamoxifen, trastuzumab -certain diuretics such as chlorothiazide, chlorthalidone, hydrochlorothiazide, indapamide, metolazone -certain medicines that treat or prevent blood clots like warfarin -certain muscle relaxants such as  succinylcholine -cyclosporine -etanercept -indomethacin -medicines to increase blood counts like filgrastim, pegfilgrastim, sargramostim -medicines used as general anesthesia -metronidazole -natalizumab This list may not describe all possible interactions. Give your health care provider a list of all the medicines, herbs, non-prescription drugs, or dietary supplements you use. Also tell them if you smoke, drink alcohol, or use illegal drugs. Some items may interact with your medicine. What should I watch for while using this medicine? Visit your doctor for checks on your progress. This drug may make you feel generally unwell. This is not uncommon, as chemotherapy can affect healthy cells as well as cancer cells. Report any side effects. Continue your course of treatment even though you feel ill unless your doctor tells you to stop. Drink water or other fluids as directed. Urinate often, even at night. In some cases, you may be given additional medicines to help with side effects. Follow all directions for their use. Call your doctor or health care professional for advice if you get a fever, chills or sore throat, or other symptoms of a cold or flu. Do not treat yourself. This drug decreases your body's ability to fight infections. Try to avoid being around people who are sick. This medicine may increase your risk to bruise or bleed. Call your doctor or health care professional if you notice any unusual bleeding. Be careful brushing and flossing your teeth   or using a toothpick because you may get an infection or bleed more easily. If you have any dental work done, tell your dentist you are receiving this medicine. You may get drowsy or dizzy. Do not drive, use machinery, or do anything that needs mental alertness until you know how this medicine affects you. Do not become pregnant while taking this medicine or for 1 year after stopping it. Women should inform their doctor if they wish to become  pregnant or think they might be pregnant. Men should not father a child while taking this medicine and for 4 months after stopping it. There is a potential for serious side effects to an unborn child. Talk to your health care professional or pharmacist for more information. Do not breast-feed an infant while taking this medicine. This medicine may interfere with the ability to have a child. This medicine has caused ovarian failure in some women. This medicine has caused reduced sperm counts in some men. You should talk with your doctor or health care professional if you are concerned about your fertility. If you are going to have surgery, tell your doctor or health care professional that you have taken this medicine. What side effects may I notice from receiving this medicine? Side effects that you should report to your doctor or health care professional as soon as possible: -allergic reactions like skin rash, itching or hives, swelling of the face, lips, or tongue -low blood counts - this medicine may decrease the number of white blood cells, red blood cells and platelets. You may be at increased risk for infections and bleeding. -signs of infection - fever or chills, cough, sore throat, pain or difficulty passing urine -signs of decreased platelets or bleeding - bruising, pinpoint red spots on the skin, black, tarry stools, blood in the urine -signs of decreased red blood cells - unusually weak or tired, fainting spells, lightheadedness -breathing problems -dark urine -dizziness -palpitations -swelling of the ankles, feet, hands -trouble passing urine or change in the amount of urine -weight gain -yellowing of the eyes or skin Side effects that usually do not require medical attention (report to your doctor or health care professional if they continue or are bothersome): -changes in nail or skin color -hair loss -missed menstrual periods -mouth sores -nausea, vomiting This list may not  describe all possible side effects. Call your doctor for medical advice about side effects. You may report side effects to FDA at 1-800-FDA-1088. Where should I keep my medicine? This drug is given in a hospital or clinic and will not be stored at home. NOTE: This sheet is a summary. It may not cover all possible information. If you have questions about this medicine, talk to your doctor, pharmacist, or health care provider.  2019 Elsevier/Gold Standard (2012-09-08 16:22:58)    

## 2019-01-15 ENCOUNTER — Inpatient Hospital Stay: Payer: BLUE CROSS/BLUE SHIELD

## 2019-01-15 VITALS — BP 96/65 | HR 75 | Temp 98.2°F | Resp 18

## 2019-01-15 DIAGNOSIS — C50011 Malignant neoplasm of nipple and areola, right female breast: Secondary | ICD-10-CM | POA: Diagnosis not present

## 2019-01-15 DIAGNOSIS — Z170 Estrogen receptor positive status [ER+]: Principal | ICD-10-CM

## 2019-01-15 MED ORDER — PEGFILGRASTIM-CBQV 6 MG/0.6ML ~~LOC~~ SOSY
6.0000 mg | PREFILLED_SYRINGE | Freq: Once | SUBCUTANEOUS | Status: AC
  Administered 2019-01-15: 6 mg via SUBCUTANEOUS

## 2019-01-15 MED ORDER — PEGFILGRASTIM-CBQV 6 MG/0.6ML ~~LOC~~ SOSY
PREFILLED_SYRINGE | SUBCUTANEOUS | Status: AC
  Filled 2019-01-15: qty 0.6

## 2019-01-15 NOTE — Patient Instructions (Signed)
Pegfilgrastim injection  What is this medicine?  PEGFILGRASTIM (PEG fil gra stim) is a long-acting granulocyte colony-stimulating factor that stimulates the growth of neutrophils, a type of white blood cell important in the body's fight against infection. It is used to reduce the incidence of fever and infection in patients with certain types of cancer who are receiving chemotherapy that affects the bone marrow, and to increase survival after being exposed to high doses of radiation.  This medicine may be used for other purposes; ask your health care provider or pharmacist if you have questions.  COMMON BRAND NAME(S): Fulphila, Neulasta, UDENYCA  What should I tell my health care provider before I take this medicine?  They need to know if you have any of these conditions:  -kidney disease  -latex allergy  -ongoing radiation therapy  -sickle cell disease  -skin reactions to acrylic adhesives (On-Body Injector only)  -an unusual or allergic reaction to pegfilgrastim, filgrastim, other medicines, foods, dyes, or preservatives  -pregnant or trying to get pregnant  -breast-feeding  How should I use this medicine?  This medicine is for injection under the skin. If you get this medicine at home, you will be taught how to prepare and give the pre-filled syringe or how to use the On-body Injector. Refer to the patient Instructions for Use for detailed instructions. Use exactly as directed. Tell your healthcare provider immediately if you suspect that the On-body Injector may not have performed as intended or if you suspect the use of the On-body Injector resulted in a missed or partial dose.  It is important that you put your used needles and syringes in a special sharps container. Do not put them in a trash can. If you do not have a sharps container, call your pharmacist or healthcare provider to get one.  Talk to your pediatrician regarding the use of this medicine in children. While this drug may be prescribed for  selected conditions, precautions do apply.  Overdosage: If you think you have taken too much of this medicine contact a poison control center or emergency room at once.  NOTE: This medicine is only for you. Do not share this medicine with others.  What if I miss a dose?  It is important not to miss your dose. Call your doctor or health care professional if you miss your dose. If you miss a dose due to an On-body Injector failure or leakage, a new dose should be administered as soon as possible using a single prefilled syringe for manual use.  What may interact with this medicine?  Interactions have not been studied.  Give your health care provider a list of all the medicines, herbs, non-prescription drugs, or dietary supplements you use. Also tell them if you smoke, drink alcohol, or use illegal drugs. Some items may interact with your medicine.  This list may not describe all possible interactions. Give your health care provider a list of all the medicines, herbs, non-prescription drugs, or dietary supplements you use. Also tell them if you smoke, drink alcohol, or use illegal drugs. Some items may interact with your medicine.  What should I watch for while using this medicine?  You may need blood work done while you are taking this medicine.  If you are going to need a MRI, CT scan, or other procedure, tell your doctor that you are using this medicine (On-Body Injector only).  What side effects may I notice from receiving this medicine?  Side effects that you should report to   your doctor or health care professional as soon as possible:  -allergic reactions like skin rash, itching or hives, swelling of the face, lips, or tongue  -back pain  -dizziness  -fever  -pain, redness, or irritation at site where injected  -pinpoint red spots on the skin  -red or dark-brown urine  -shortness of breath or breathing problems  -stomach or side pain, or pain at the shoulder  -swelling  -tiredness  -trouble passing urine or  change in the amount of urine  Side effects that usually do not require medical attention (report to your doctor or health care professional if they continue or are bothersome):  -bone pain  -muscle pain  This list may not describe all possible side effects. Call your doctor for medical advice about side effects. You may report side effects to FDA at 1-800-FDA-1088.  Where should I keep my medicine?  Keep out of the reach of children.  If you are using this medicine at home, you will be instructed on how to store it. Throw away any unused medicine after the expiration date on the label.  NOTE: This sheet is a summary. It may not cover all possible information. If you have questions about this medicine, talk to your doctor, pharmacist, or health care provider.   2019 Elsevier/Gold Standard (2018-01-30 16:57:08)

## 2019-01-24 NOTE — Progress Notes (Signed)
Patient Care Team: Newton Pigg, MD as PCP - General (Obstetrics and Gynecology)  DIAGNOSIS:    ICD-10-CM   1. 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  The Hereditary Gene Panel offered by [facility] includes sequencing and/or deletion duplication testing of the following 47 genes: APC, ATM, AXIN2, BARD1, BMPR1A, BRCA1, BRCA2, BRIP1, CDH1, CDK4, CDKN2A (p14ARF), CDKN2A (p16INK4a), CHEK2, CTNNA1, DICER1, EPCAM (Deletion/duplication testing only), GREM1 (promoter region deletion/duplication testing only), KIT, MEN1, MLH1, MSH2, MSH3, MSH6, MUTYH, NBN, NF1, NHTL1, PALB2, PDGFRA, PMS2, POLD1, POLE, PTEN, RAD50, RAD51C, RAD51D, SDHB, SDHC, SDHD, SMAD4, SMARCA4. STK11, TP53, TSC1, TSC2, and VHL.  The following genes were evaluated for sequence changes only: SDHA and HOXB13 c.251G>A variant only. The report date is December 08, 2018.    12/29/2018 -  Chemotherapy    The patient had DOXOrubicin (ADRIAMYCIN) chemo injection 94 mg, 60 mg/m2 = 94 mg, Intravenous,  Once, 2 of 4 cycles Administration: 94 mg (12/29/2018), 94 mg (01/12/2019) palonosetron (ALOXI) injection  0.25 mg, 0.25 mg, Intravenous,  Once, 2 of 4 cycles Administration: 0.25 mg (12/29/2018), 0.25 mg (01/12/2019) pegfilgrastim-cbqv (UDENYCA) injection 6 mg, 6 mg, Subcutaneous, Once, 2 of 4 cycles Administration: 6 mg (01/01/2019), 6 mg (01/15/2019) cyclophosphamide (CYTOXAN) 940 mg in sodium chloride 0.9 % 250 mL chemo infusion, 600 mg/m2 = 940 mg, Intravenous,  Once, 2 of 4 cycles Administration: 940 mg (12/29/2018), 940 mg (01/12/2019) PACLitaxel (TAXOL) 126 mg in sodium chloride 0.9 % 250 mL chemo infusion (</= 17m/m2), 80 mg/m2, Intravenous,  Once, 0 of 12 cycles fosaprepitant (EMEND) 150 mg, dexamethasone (DECADRON) 12 mg in sodium chloride 0.9 % 145 mL IVPB, , Intravenous,  Once, 2 of 4 cycles Administration:  (12/29/2018),  (01/12/2019)  for chemotherapy treatment.      CHIEF COMPLIANT: Cycle 3 Adriamycin and Cytoxan  INTERVAL HISTORY: Ariana Luna is a 43y.o. with above-mentioned history of right breast cancer [place] and who is currently receiving ajduvant chemotherapy with dose dense Adriamycin and Cytoxan.She presents to the clinic today with 2 family members for cycle 3. An interpreter was used for the visit. She denies nausea, diarrhea, constipation, or mouth sores following her last treatment but notes fatigue and drowsiness after taking Zofran. Her bp at the clinic today was 88/64 but she denies any dizziness or lightheadedness and is not currently on bp medication. She reports pain at her port site during infusions but has not been using the Emla cream.   REVIEW OF SYSTEMS:   Constitutional: Denies fevers, chills or abnormal weight loss (+) fatigue (+) drowsiness Eyes: Denies blurriness  of vision Ears, nose, mouth, throat, and face: Denies mucositis or sore throat Respiratory: Denies cough, dyspnea or wheezes Cardiovascular: Denies palpitation, chest discomfort Gastrointestinal: Denies nausea, heartburn or change in bowel habits Skin: Denies  abnormal skin rashes Lymphatics: Denies new lymphadenopathy or easy bruising Neurological: Denies numbness, tingling or new weaknesses Behavioral/Psych: Mood is stable, no new changes  Extremities: No lower extremity edema  Breast: denies any pain or lumps or nodules in either breasts (+) pain at port site All other systems were reviewed with the patient and are negative.  I have reviewed the past medical history, past surgical history, social history and family history with the patient and they are unchanged from previous note.  ALLERGIES:  has No Known Allergies.  MEDICATIONS:  Current Outpatient Medications  Medication Sig Dispense Refill  . dexamethasone (DECADRON) 4 MG tablet Take 1 tablet (4 mg total) by mouth daily. Take 1 tablet day after chemo and 1 tablet 2 days after chemo with food 8 tablet 0  . lidocaine-prilocaine (EMLA) cream Apply 1 application topically as needed. 30 g 0  . lidocaine-prilocaine (EMLA) cream Apply to affected area once 30 g 3  . LORazepam (ATIVAN) 0.5 MG tablet Take 1 tablet (0.5 mg total) by mouth at bedtime as needed for sleep. 30 tablet 0  . ondansetron (ZOFRAN) 8 MG tablet Take 1 tablet (8 mg total) by mouth 2 (two) times daily as needed. Start on the third day after chemotherapy. 30 tablet 1  . prochlorperazine (COMPAZINE) 10 MG tablet Take 1 tablet (10 mg total) by mouth every 6 (six) hours as needed (Nausea or vomiting). 30 tablet 1   No current facility-administered medications for this visit.     PHYSICAL EXAMINATION: ECOG PERFORMANCE STATUS: 0 - Asymptomatic  Vitals:   01/25/19 0906  BP: (!) 88/64  Pulse: 75  Resp: 18  Temp: 98 F (36.7 C)  SpO2: 100%   Filed Weights   01/25/19 0906  Weight: 121 lb 14.4 oz (55.3 kg)    GENERAL: alert, no distress and comfortable SKIN: skin color, texture, turgor are normal, no rashes or significant lesions EYES: normal, Conjunctiva are pink and non-injected, sclera clear OROPHARYNX: no exudate,  no erythema and lips, buccal mucosa, and tongue normal  NECK: supple, thyroid normal size, non-tender, without nodularity LYMPH: no palpable lymphadenopathy in the cervical, axillary or inguinal LUNGS: clear to auscultation and percussion with normal breathing effort HEART: regular rate & rhythm and no murmurs and no lower extremity edema ABDOMEN: abdomen soft, non-tender and normal bowel sounds MUSCULOSKELETAL: no cyanosis of digits and no clubbing  NEURO: alert & oriented x 3 with fluent speech, no focal motor/sensory deficits EXTREMITIES: No lower extremity edema  LABORATORY DATA:  I have reviewed the data as listed CMP Latest Ref Rng & Units 01/12/2019 01/04/2019 12/29/2018  Glucose 70 - 99 mg/dL 79 96 119(H)  BUN 6 - 20 mg/dL 9 14 9   Creatinine 0.44 - 1.00 mg/dL 0.87 0.66 0.77  Sodium 135 - 145 mmol/L 138 136 139  Potassium 3.5 - 5.1 mmol/L 3.8 3.9 3.6  Chloride 98 - 111 mmol/L 103 102 105  CO2 22 - 32 mmol/L 27 26 23   Calcium 8.9 - 10.3 mg/dL 9.3 8.9 9.2  Total Protein 6.5 - 8.1 g/dL 7.6 7.5 7.8  Total Bilirubin 0.3 - 1.2 mg/dL 0.2(L) 0.6 0.9  Alkaline Phos 38 - 126 U/L 80 84 83  AST 15 - 41 U/L 21 17 18   ALT 0 - 44 U/L  24 30 31     Lab Results  Component Value Date   WBC 11.6 (H) 01/25/2019   HGB 10.7 (L) 01/25/2019   HCT 33.3 (L) 01/25/2019   MCV 87.4 01/25/2019   PLT 158 01/25/2019   NEUTROABS PENDING 01/25/2019    ASSESSMENT & PLAN:  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 Adjuvant radiation therapy because of the tumor size  being large 3. Followed by adjuvant antiestrogen therapy with tamoxifen 20 mg daily x10 years ------------------------------------------------------------------------------------------------------------------- Current Treatment: Cycle 3 day 1 Adriamycin and Cytoxan ECHO feb 2020: EF 60-65% Labs reviewed  Chemo toxicities: 1.Nausea and vomiting: Resolved see some 2.Headache: Could be related to Zofran versus migraine. 3.Fatigue as expected from chemo. 4.  Loss of taste 5.  Hair loss Return to clinic in 2 weeks for cycle 4    No orders of the defined types were placed in this encounter.  The patient has a good understanding of the overall plan. she agrees with it. she will call with any problems that may develop before the next visit here.  Nicholas Lose, MD 01/25/2019  Julious Oka Dorshimer am acting as scribe for Dr. Nicholas Lose.  I have reviewed the above documentation for accuracy and completeness, and I agree with the above.

## 2019-01-25 ENCOUNTER — Inpatient Hospital Stay: Payer: BLUE CROSS/BLUE SHIELD

## 2019-01-25 ENCOUNTER — Inpatient Hospital Stay (HOSPITAL_BASED_OUTPATIENT_CLINIC_OR_DEPARTMENT_OTHER): Payer: BLUE CROSS/BLUE SHIELD | Admitting: Hematology and Oncology

## 2019-01-25 ENCOUNTER — Other Ambulatory Visit: Payer: Self-pay

## 2019-01-25 DIAGNOSIS — Z170 Estrogen receptor positive status [ER+]: Secondary | ICD-10-CM | POA: Diagnosis not present

## 2019-01-25 DIAGNOSIS — Z5111 Encounter for antineoplastic chemotherapy: Secondary | ICD-10-CM | POA: Diagnosis not present

## 2019-01-25 DIAGNOSIS — R51 Headache: Secondary | ICD-10-CM

## 2019-01-25 DIAGNOSIS — Z9011 Acquired absence of right breast and nipple: Secondary | ICD-10-CM

## 2019-01-25 DIAGNOSIS — Z95828 Presence of other vascular implants and grafts: Secondary | ICD-10-CM

## 2019-01-25 DIAGNOSIS — Z79899 Other long term (current) drug therapy: Secondary | ICD-10-CM

## 2019-01-25 DIAGNOSIS — C50011 Malignant neoplasm of nipple and areola, right female breast: Secondary | ICD-10-CM

## 2019-01-25 DIAGNOSIS — R5383 Other fatigue: Secondary | ICD-10-CM

## 2019-01-25 DIAGNOSIS — Z7689 Persons encountering health services in other specified circumstances: Secondary | ICD-10-CM

## 2019-01-25 DIAGNOSIS — Z923 Personal history of irradiation: Secondary | ICD-10-CM

## 2019-01-25 DIAGNOSIS — R112 Nausea with vomiting, unspecified: Secondary | ICD-10-CM

## 2019-01-25 LAB — CBC WITH DIFFERENTIAL (CANCER CENTER ONLY)
ABS IMMATURE GRANULOCYTES: 1.22 10*3/uL — AB (ref 0.00–0.07)
Basophils Absolute: 0 10*3/uL (ref 0.0–0.1)
Basophils Relative: 0 %
Eosinophils Absolute: 0 10*3/uL (ref 0.0–0.5)
Eosinophils Relative: 0 %
HCT: 33.3 % — ABNORMAL LOW (ref 36.0–46.0)
Hemoglobin: 10.7 g/dL — ABNORMAL LOW (ref 12.0–15.0)
IMMATURE GRANULOCYTES: 11 %
Lymphocytes Relative: 7 %
Lymphs Abs: 0.8 10*3/uL (ref 0.7–4.0)
MCH: 28.1 pg (ref 26.0–34.0)
MCHC: 32.1 g/dL (ref 30.0–36.0)
MCV: 87.4 fL (ref 80.0–100.0)
Monocytes Absolute: 1 10*3/uL (ref 0.1–1.0)
Monocytes Relative: 8 %
NEUTROS PCT: 74 %
Neutro Abs: 8.6 10*3/uL — ABNORMAL HIGH (ref 1.7–7.7)
PLATELETS: 158 10*3/uL (ref 150–400)
RBC: 3.81 MIL/uL — ABNORMAL LOW (ref 3.87–5.11)
RDW: 13.5 % (ref 11.5–15.5)
WBC Count: 11.6 10*3/uL — ABNORMAL HIGH (ref 4.0–10.5)
nRBC: 0 % (ref 0.0–0.2)

## 2019-01-25 LAB — CMP (CANCER CENTER ONLY)
ALT: 22 U/L (ref 0–44)
AST: 19 U/L (ref 15–41)
Albumin: 3.8 g/dL (ref 3.5–5.0)
Alkaline Phosphatase: 81 U/L (ref 38–126)
Anion gap: 8 (ref 5–15)
BUN: 5 mg/dL — ABNORMAL LOW (ref 6–20)
CO2: 25 mmol/L (ref 22–32)
Calcium: 8.5 mg/dL — ABNORMAL LOW (ref 8.9–10.3)
Chloride: 108 mmol/L (ref 98–111)
Creatinine: 0.69 mg/dL (ref 0.44–1.00)
GFR, Est AFR Am: >60 mL/min (ref >60)
GFR, Estimated: >60 mL/min (ref >60)
Glucose, Bld: 94 mg/dL (ref 70–99)
Potassium: 3.8 mmol/L (ref 3.5–5.1)
Sodium: 141 mmol/L (ref 135–145)
Total Bilirubin: <0.2 mg/dL — ABNORMAL LOW (ref 0.3–1.2)
Total Protein: 7 g/dL (ref 6.5–8.1)

## 2019-01-25 MED ORDER — PALONOSETRON HCL INJECTION 0.25 MG/5ML
INTRAVENOUS | Status: AC
  Filled 2019-01-25: qty 5

## 2019-01-25 MED ORDER — SODIUM CHLORIDE 0.9% FLUSH
10.0000 mL | INTRAVENOUS | Status: DC | PRN
  Administered 2019-01-25: 10 mL
  Filled 2019-01-25: qty 10

## 2019-01-25 MED ORDER — HEPARIN SOD (PORK) LOCK FLUSH 100 UNIT/ML IV SOLN
500.0000 [IU] | Freq: Once | INTRAVENOUS | Status: AC | PRN
  Administered 2019-01-25: 500 [IU]
  Filled 2019-01-25: qty 5

## 2019-01-25 MED ORDER — SODIUM CHLORIDE 0.9 % IV SOLN
Freq: Once | INTRAVENOUS | Status: AC
  Administered 2019-01-25: 10:00:00 via INTRAVENOUS
  Filled 2019-01-25: qty 250

## 2019-01-25 MED ORDER — PALONOSETRON HCL INJECTION 0.25 MG/5ML
0.2500 mg | Freq: Once | INTRAVENOUS | Status: AC
  Administered 2019-01-25: 0.25 mg via INTRAVENOUS

## 2019-01-25 MED ORDER — SODIUM CHLORIDE 0.9 % IV SOLN
600.0000 mg/m2 | Freq: Once | INTRAVENOUS | Status: AC
  Administered 2019-01-25: 940 mg via INTRAVENOUS
  Filled 2019-01-25: qty 47

## 2019-01-25 MED ORDER — SODIUM CHLORIDE 0.9 % IV SOLN
Freq: Once | INTRAVENOUS | Status: AC
  Administered 2019-01-25: 10:00:00 via INTRAVENOUS
  Filled 2019-01-25: qty 5

## 2019-01-25 MED ORDER — DOXORUBICIN HCL CHEMO IV INJECTION 2 MG/ML
60.0000 mg/m2 | Freq: Once | INTRAVENOUS | Status: AC
  Administered 2019-01-25: 94 mg via INTRAVENOUS
  Filled 2019-01-25: qty 47

## 2019-01-25 NOTE — Assessment & Plan Note (Addendum)
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 Adjuvant radiation therapy because of the tumor size being large 3. Followed by adjuvant antiestrogen therapy with tamoxifen 20 mg daily x10 years ------------------------------------------------------------------------------------------------------------------- Current Treatment: Cycle 3 day 1 Adriamycin and Cytoxan ECHO feb 2020: EF 60-65% Labs reviewed  Chemo toxicities: 1.Nausea and vomiting: Resolved see some 2.Headache: Could be related to Zofran versus migraine. 3.Fatigue as expected from chemo. 4.  Loss of taste 5.  Hair loss Return to clinic in 2 weeks for cycle 4

## 2019-01-25 NOTE — Patient Instructions (Signed)
[facility] Discharge Instructions for Patients Receiving Chemotherapy  Today you received the following chemotherapy agents Adriamycin and Cytoxan  To help prevent nausea and vomiting after your treatment, we encourage you to take your nausea medication as directed.  If you develop nausea and vomiting that is not controlled by your nausea medication, call the clinic.   BELOW ARE SYMPTOMS THAT SHOULD BE REPORTED IMMEDIATELY:  *FEVER GREATER THAN 100.5 F  *CHILLS WITH OR WITHOUT FEVER  NAUSEA AND VOMITING THAT IS NOT CONTROLLED WITH YOUR NAUSEA MEDICATION  *UNUSUAL SHORTNESS OF BREATH  *UNUSUAL BRUISING OR BLEEDING  TENDERNESS IN MOUTH AND THROAT WITH OR WITHOUT PRESENCE OF ULCERS  *URINARY PROBLEMS  *BOWEL PROBLEMS  UNUSUAL RASH Items with * indicate a potential emergency and should be followed up as soon as possible.  Feel free to call the clinic should you have any questions or concerns. The clinic phone number is [phone].  Please show the CHEMO ALERT CARD at check-in to the Emergency Department and triage nurse.   

## 2019-01-27 ENCOUNTER — Inpatient Hospital Stay: Payer: BLUE CROSS/BLUE SHIELD

## 2019-01-27 ENCOUNTER — Other Ambulatory Visit: Payer: Self-pay

## 2019-01-27 VITALS — BP 93/61 | HR 66 | Temp 98.4°F | Resp 18

## 2019-01-27 DIAGNOSIS — C50011 Malignant neoplasm of nipple and areola, right female breast: Secondary | ICD-10-CM | POA: Diagnosis not present

## 2019-01-27 DIAGNOSIS — Z170 Estrogen receptor positive status [ER+]: Principal | ICD-10-CM

## 2019-01-27 MED ORDER — PEGFILGRASTIM-CBQV 6 MG/0.6ML ~~LOC~~ SOSY
PREFILLED_SYRINGE | SUBCUTANEOUS | Status: AC
  Filled 2019-01-27: qty 0.6

## 2019-01-27 MED ORDER — PEGFILGRASTIM-CBQV 6 MG/0.6ML ~~LOC~~ SOSY
6.0000 mg | PREFILLED_SYRINGE | Freq: Once | SUBCUTANEOUS | Status: AC
  Administered 2019-01-27: 6 mg via SUBCUTANEOUS

## 2019-01-27 NOTE — Patient Instructions (Signed)
Pegfilgrastim injection  What is this medicine?  PEGFILGRASTIM (PEG fil gra stim) is a long-acting granulocyte colony-stimulating factor that stimulates the growth of neutrophils, a type of white blood cell important in the body's fight against infection. It is used to reduce the incidence of fever and infection in patients with certain types of cancer who are receiving chemotherapy that affects the bone marrow, and to increase survival after being exposed to high doses of radiation.  This medicine may be used for other purposes; ask your health care provider or pharmacist if you have questions.  COMMON BRAND NAME(S): Fulphila, Neulasta, UDENYCA  What should I tell my health care provider before I take this medicine?  They need to know if you have any of these conditions:  -kidney disease  -latex allergy  -ongoing radiation therapy  -sickle cell disease  -skin reactions to acrylic adhesives (On-Body Injector only)  -an unusual or allergic reaction to pegfilgrastim, filgrastim, other medicines, foods, dyes, or preservatives  -pregnant or trying to get pregnant  -breast-feeding  How should I use this medicine?  This medicine is for injection under the skin. If you get this medicine at home, you will be taught how to prepare and give the pre-filled syringe or how to use the On-body Injector. Refer to the patient Instructions for Use for detailed instructions. Use exactly as directed. Tell your healthcare provider immediately if you suspect that the On-body Injector may not have performed as intended or if you suspect the use of the On-body Injector resulted in a missed or partial dose.  It is important that you put your used needles and syringes in a special sharps container. Do not put them in a trash can. If you do not have a sharps container, call your pharmacist or healthcare provider to get one.  Talk to your pediatrician regarding the use of this medicine in children. While this drug may be prescribed for  selected conditions, precautions do apply.  Overdosage: If you think you have taken too much of this medicine contact a poison control center or emergency room at once.  NOTE: This medicine is only for you. Do not share this medicine with others.  What if I miss a dose?  It is important not to miss your dose. Call your doctor or health care professional if you miss your dose. If you miss a dose due to an On-body Injector failure or leakage, a new dose should be administered as soon as possible using a single prefilled syringe for manual use.  What may interact with this medicine?  Interactions have not been studied.  Give your health care provider a list of all the medicines, herbs, non-prescription drugs, or dietary supplements you use. Also tell them if you smoke, drink alcohol, or use illegal drugs. Some items may interact with your medicine.  This list may not describe all possible interactions. Give your health care provider a list of all the medicines, herbs, non-prescription drugs, or dietary supplements you use. Also tell them if you smoke, drink alcohol, or use illegal drugs. Some items may interact with your medicine.  What should I watch for while using this medicine?  You may need blood work done while you are taking this medicine.  If you are going to need a MRI, CT scan, or other procedure, tell your doctor that you are using this medicine (On-Body Injector only).  What side effects may I notice from receiving this medicine?  Side effects that you should report to   your doctor or health care professional as soon as possible:  -allergic reactions like skin rash, itching or hives, swelling of the face, lips, or tongue  -back pain  -dizziness  -fever  -pain, redness, or irritation at site where injected  -pinpoint red spots on the skin  -red or dark-brown urine  -shortness of breath or breathing problems  -stomach or side pain, or pain at the shoulder  -swelling  -tiredness  -trouble passing urine or  change in the amount of urine  Side effects that usually do not require medical attention (report to your doctor or health care professional if they continue or are bothersome):  -bone pain  -muscle pain  This list may not describe all possible side effects. Call your doctor for medical advice about side effects. You may report side effects to FDA at 1-800-FDA-1088.  Where should I keep my medicine?  Keep out of the reach of children.  If you are using this medicine at home, you will be instructed on how to store it. Throw away any unused medicine after the expiration date on the label.  NOTE: This sheet is a summary. It may not cover all possible information. If you have questions about this medicine, talk to your doctor, pharmacist, or health care provider.   2019 Elsevier/Gold Standard (2018-01-30 16:57:08)

## 2019-02-07 NOTE — Progress Notes (Signed)
Patient Care Team: Newton Pigg, MD as PCP - General (Obstetrics and Gynecology)  DIAGNOSIS:    ICD-10-CM   1. 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, estrogen receptor positive ([place])   09/30/2018 Surgery    Right mastectomy with axillary lymph node dissection in [place]: Grade 2 IDC, 5.3 cm, DCIS, margins negative, lymphovascular invasion present, 0/18 lymph nodes, ER 60%, PR 0%, HER-2 negative, Ki-67 90%, T3N0 stage II B    11/15/2018 Cancer Staging    Staging form: Breast, AJCC 8th Edition - Pathologic: Stage IIB (pT3, pN0, cM0, G2, ER+, PR-, HER2-) - Signed by Nicholas Lose, MD on 11/15/2018    11/29/2018 Oncotype testing    Oncotype DX score 28: 17% risk of distant recurrence at 9 years    12/08/2018 Genetic Testing    Negative genetic testing on the common hereditary cancer panel.  The Hereditary Gene Panel offered by [facility] includes sequencing and/or deletion duplication testing of the following 47 genes: APC, ATM, AXIN2, BARD1, BMPR1A, BRCA1, BRCA2, BRIP1, CDH1, CDK4, CDKN2A (p14ARF), CDKN2A (p16INK4a), CHEK2, CTNNA1, DICER1, EPCAM (Deletion/duplication testing only), GREM1 (promoter region deletion/duplication testing only), KIT, MEN1, MLH1, MSH2, MSH3, MSH6, MUTYH, NBN, NF1, NHTL1, PALB2, PDGFRA, PMS2, POLD1, POLE, PTEN, RAD50, RAD51C, RAD51D, SDHB, SDHC, SDHD, SMAD4, SMARCA4. STK11, TP53, TSC1, TSC2, and VHL.  The following genes were evaluated for sequence changes only: SDHA and HOXB13 c.251G>A variant only. The report date is December 08, 2018.    12/29/2018 -  Chemotherapy    The patient had DOXOrubicin (ADRIAMYCIN) chemo injection 94 mg, 60 mg/m2 = 94 mg, Intravenous,  Once, 3 of 4 cycles Administration: 94 mg (12/29/2018), 94 mg (01/12/2019), 94 mg (01/25/2019) palonosetron  (ALOXI) injection 0.25 mg, 0.25 mg, Intravenous,  Once, 3 of 4 cycles Administration: 0.25 mg (12/29/2018), 0.25 mg (01/12/2019), 0.25 mg (01/25/2019) pegfilgrastim-cbqv (UDENYCA) injection 6 mg, 6 mg, Subcutaneous, Once, 3 of 4 cycles Administration: 6 mg (01/01/2019), 6 mg (01/15/2019), 6 mg (01/27/2019) cyclophosphamide (CYTOXAN) 940 mg in sodium chloride 0.9 % 250 mL chemo infusion, 600 mg/m2 = 940 mg, Intravenous,  Once, 3 of 4 cycles Administration: 940 mg (12/29/2018), 940 mg (01/12/2019), 940 mg (01/25/2019) PACLitaxel (TAXOL) 126 mg in sodium chloride 0.9 % 250 mL chemo infusion (</= 36m/m2), 80 mg/m2, Intravenous,  Once, 0 of 12 cycles fosaprepitant (EMEND) 150 mg, dexamethasone (DECADRON) 12 mg in sodium chloride 0.9 % 145 mL IVPB, , Intravenous,  Once, 3 of 4 cycles Administration:  (12/29/2018),  (01/12/2019),  (01/25/2019)  for chemotherapy treatment.      CHIEF COMPLIANT: Cycle 4 Adriamycin and Cytoxan  INTERVAL HISTORY: Ariana Luna is a 43y.o. with above-mentioned history of right breast cancer [place] and who is currently receiving ajduvant chemotherapy withdose denseAdriamycinand Cytoxan.She presents to the clinic todaywith an interpreter for cycle 4.  She is tolerating the chemo extremely well.  She does not have any nausea or vomiting.  Headaches have resolved.  Her taste is not as good as it used to be.  REVIEW OF SYSTEMS:   Constitutional: Denies fevers, chills or abnormal weight loss Eyes: Denies blurriness of vision Ears, nose, mouth, throat, and face: Denies mucositis or sore throat Respiratory: Denies cough, dyspnea or wheezes Cardiovascular: Denies palpitation, chest discomfort Gastrointestinal: Denies nausea, heartburn or  change in bowel habits Skin: Denies abnormal skin rashes Lymphatics: Denies new lymphadenopathy or easy bruising Neurological: Denies numbness, tingling or new weaknesses Behavioral/Psych: Mood is stable, no  new changes  Extremities: No lower extremity edema Breast: denies any pain or lumps or nodules in either breasts All other systems were reviewed with the patient and are negative.  I have reviewed the past medical history, past surgical history, social history and family history with the patient and they are unchanged from previous note.  ALLERGIES:  has No Known Allergies.  MEDICATIONS:  Current Outpatient Medications  Medication Sig Dispense Refill  . dexamethasone (DECADRON) 4 MG tablet Take 1 tablet (4 mg total) by mouth daily. Take 1 tablet day after chemo and 1 tablet 2 days after chemo with food 8 tablet 0  . lidocaine-prilocaine (EMLA) cream Apply 1 application topically as needed. 30 g 0  . lidocaine-prilocaine (EMLA) cream Apply to affected area once 30 g 3  . LORazepam (ATIVAN) 0.5 MG tablet Take 1 tablet (0.5 mg total) by mouth at bedtime as needed for sleep. 30 tablet 0  . ondansetron (ZOFRAN) 8 MG tablet Take 1 tablet (8 mg total) by mouth 2 (two) times daily as needed. Start on the third day after chemotherapy. 30 tablet 1  . prochlorperazine (COMPAZINE) 10 MG tablet Take 1 tablet (10 mg total) by mouth every 6 (six) hours as needed (Nausea or vomiting). 30 tablet 1   No current facility-administered medications for this visit.     PHYSICAL EXAMINATION: ECOG PERFORMANCE STATUS: 1 - Symptomatic but completely ambulatory  Vitals:   02/09/19 0911  BP: 98/63  Pulse: 79  Resp: 18  Temp: 98.3 F (36.8 C)  SpO2: 100%   Filed Weights   02/09/19 0911  Weight: 116 lb 1.6 oz (52.7 kg)    GENERAL: alert, no distress and comfortable SKIN: skin color, texture, turgor are normal, no rashes or significant lesions EYES: normal, Conjunctiva are pink and non-injected, sclera clear OROPHARYNX: no exudate, no erythema and lips, buccal mucosa, and tongue normal  NECK: supple, thyroid normal size, non-tender, without nodularity LYMPH: no palpable lymphadenopathy in the cervical,  axillary or inguinal LUNGS: clear to auscultation and percussion with normal breathing effort HEART: regular rate & rhythm and no murmurs and no lower extremity edema ABDOMEN: abdomen soft, non-tender and normal bowel sounds MUSCULOSKELETAL: no cyanosis of digits and no clubbing  NEURO: alert & oriented x 3 with fluent speech, no focal motor/sensory deficits EXTREMITIES: No lower extremity edema  LABORATORY DATA:  I have reviewed the data as listed CMP Latest Ref Rng & Units 01/25/2019 01/12/2019 01/04/2019  Glucose 70 - 99 mg/dL 94 79 96  BUN 6 - 20 mg/dL 5(L) 9 14  Creatinine 0.44 - 1.00 mg/dL 0.69 0.87 0.66  Sodium 135 - 145 mmol/L 141 138 136  Potassium 3.5 - 5.1 mmol/L 3.8 3.8 3.9  Chloride 98 - 111 mmol/L 108 103 102  CO2 22 - 32 mmol/L 25 27 26   Calcium 8.9 - 10.3 mg/dL 8.5(L) 9.3 8.9  Total Protein 6.5 - 8.1 g/dL 7.0 7.6 7.5  Total Bilirubin 0.3 - 1.2 mg/dL <0.2(L) 0.2(L) 0.6  Alkaline Phos 38 - 126 U/L 81 80 84  AST 15 - 41 U/L 19 21 17   ALT 0 - 44 U/L 22 24 30     Lab Results  Component Value Date   WBC 9.4 02/09/2019   HGB 11.3 (L) 02/09/2019   HCT 35.2 (L) 02/09/2019   MCV 88.7  02/09/2019   PLT 224 02/09/2019   NEUTROABS 7.5 02/09/2019    ASSESSMENT & PLAN:  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 Adjuvant radiation therapy because of the tumor size being large 3. Followed by adjuvant antiestrogen therapy with tamoxifen 20 mg daily x10 years  ------------------------------------------------------------------------------------------------------------------- Current Treatment: Cycle 4 day 1 Adriamycin and Cytoxan ECHO feb 2020: EF 60-65% Labs reviewed  Chemo toxicities: 1.Nausea and vomiting: Resolved see some 2.Headache: Could be related to Zofran versus migraine. 3.Fatigue as expected from chemo. 4.  Loss of taste 5.  Hair loss  Return to clinic in 2 weeks for cycle 1 of Taxol    No orders of the defined types were placed in this encounter.  The patient has a good understanding of the overall plan. she agrees with it. she will call with any problems that may develop before the next visit here.  Nicholas Lose, MD 02/09/2019  Julious Oka Dorshimer am acting as scribe for Dr. Nicholas Lose.  I have reviewed the above documentation for accuracy and completeness, and I agree with the above.

## 2019-02-09 ENCOUNTER — Inpatient Hospital Stay (HOSPITAL_BASED_OUTPATIENT_CLINIC_OR_DEPARTMENT_OTHER): Payer: BLUE CROSS/BLUE SHIELD | Admitting: Hematology and Oncology

## 2019-02-09 ENCOUNTER — Inpatient Hospital Stay: Payer: BLUE CROSS/BLUE SHIELD

## 2019-02-09 ENCOUNTER — Inpatient Hospital Stay: Payer: BLUE CROSS/BLUE SHIELD | Attending: Hematology and Oncology

## 2019-02-09 ENCOUNTER — Telehealth: Payer: Self-pay | Admitting: Hematology and Oncology

## 2019-02-09 ENCOUNTER — Other Ambulatory Visit: Payer: Self-pay

## 2019-02-09 DIAGNOSIS — C50011 Malignant neoplasm of nipple and areola, right female breast: Secondary | ICD-10-CM | POA: Insufficient documentation

## 2019-02-09 DIAGNOSIS — Z79899 Other long term (current) drug therapy: Secondary | ICD-10-CM | POA: Insufficient documentation

## 2019-02-09 DIAGNOSIS — Z5111 Encounter for antineoplastic chemotherapy: Secondary | ICD-10-CM

## 2019-02-09 DIAGNOSIS — Z9011 Acquired absence of right breast and nipple: Secondary | ICD-10-CM | POA: Insufficient documentation

## 2019-02-09 DIAGNOSIS — Z95828 Presence of other vascular implants and grafts: Secondary | ICD-10-CM

## 2019-02-09 DIAGNOSIS — Z923 Personal history of irradiation: Secondary | ICD-10-CM

## 2019-02-09 DIAGNOSIS — R5383 Other fatigue: Secondary | ICD-10-CM | POA: Insufficient documentation

## 2019-02-09 DIAGNOSIS — Z170 Estrogen receptor positive status [ER+]: Secondary | ICD-10-CM | POA: Insufficient documentation

## 2019-02-09 DIAGNOSIS — R110 Nausea: Secondary | ICD-10-CM

## 2019-02-09 DIAGNOSIS — Z7689 Persons encountering health services in other specified circumstances: Secondary | ICD-10-CM | POA: Diagnosis not present

## 2019-02-09 DIAGNOSIS — R51 Headache: Secondary | ICD-10-CM | POA: Insufficient documentation

## 2019-02-09 DIAGNOSIS — Z9221 Personal history of antineoplastic chemotherapy: Secondary | ICD-10-CM | POA: Diagnosis not present

## 2019-02-09 LAB — CMP (CANCER CENTER ONLY)
ALT: 22 U/L (ref 0–44)
AST: 20 U/L (ref 15–41)
Albumin: 4.1 g/dL (ref 3.5–5.0)
Alkaline Phosphatase: 94 U/L (ref 38–126)
Anion gap: 9 (ref 5–15)
BUN: 11 mg/dL (ref 6–20)
CO2: 25 mmol/L (ref 22–32)
Calcium: 8.9 mg/dL (ref 8.9–10.3)
Chloride: 105 mmol/L (ref 98–111)
Creatinine: 0.67 mg/dL (ref 0.44–1.00)
GFR, Est AFR Am: >60 mL/min (ref >60)
GFR, Estimated: >60 mL/min (ref >60)
Glucose, Bld: 96 mg/dL (ref 70–99)
Potassium: 3.9 mmol/L (ref 3.5–5.1)
Sodium: 139 mmol/L (ref 135–145)
Total Bilirubin: 0.3 mg/dL (ref 0.3–1.2)
Total Protein: 7.4 g/dL (ref 6.5–8.1)

## 2019-02-09 LAB — CBC WITH DIFFERENTIAL (CANCER CENTER ONLY)
Abs Immature Granulocytes: 0.25 10*3/uL — ABNORMAL HIGH (ref 0.00–0.07)
Basophils Absolute: 0.1 10*3/uL (ref 0.0–0.1)
Basophils Relative: 1 %
Eosinophils Absolute: 0 10*3/uL (ref 0.0–0.5)
Eosinophils Relative: 0 %
HCT: 35.2 % — ABNORMAL LOW (ref 36.0–46.0)
Hemoglobin: 11.3 g/dL — ABNORMAL LOW (ref 12.0–15.0)
Immature Granulocytes: 3 %
Lymphocytes Relative: 8 %
Lymphs Abs: 0.8 10*3/uL (ref 0.7–4.0)
MCH: 28.5 pg (ref 26.0–34.0)
MCHC: 32.1 g/dL (ref 30.0–36.0)
MCV: 88.7 fL (ref 80.0–100.0)
Monocytes Absolute: 0.8 10*3/uL (ref 0.1–1.0)
Monocytes Relative: 9 %
Neutro Abs: 7.5 10*3/uL (ref 1.7–7.7)
Neutrophils Relative %: 79 %
Platelet Count: 224 10*3/uL (ref 150–400)
RBC: 3.97 MIL/uL (ref 3.87–5.11)
RDW: 15.3 % (ref 11.5–15.5)
WBC Count: 9.4 10*3/uL (ref 4.0–10.5)
nRBC: 0 % (ref 0.0–0.2)

## 2019-02-09 MED ORDER — SODIUM CHLORIDE 0.9 % IV SOLN
600.0000 mg/m2 | Freq: Once | INTRAVENOUS | Status: AC
  Administered 2019-02-09: 12:00:00 940 mg via INTRAVENOUS
  Filled 2019-02-09: qty 47

## 2019-02-09 MED ORDER — PALONOSETRON HCL INJECTION 0.25 MG/5ML
0.2500 mg | Freq: Once | INTRAVENOUS | Status: AC
  Administered 2019-02-09: 0.25 mg via INTRAVENOUS

## 2019-02-09 MED ORDER — SODIUM CHLORIDE 0.9 % IV SOLN
Freq: Once | INTRAVENOUS | Status: AC
  Administered 2019-02-09: 10:00:00 via INTRAVENOUS
  Filled 2019-02-09: qty 5

## 2019-02-09 MED ORDER — SODIUM CHLORIDE 0.9% FLUSH
10.0000 mL | INTRAVENOUS | Status: DC | PRN
  Administered 2019-02-09: 10 mL
  Filled 2019-02-09: qty 10

## 2019-02-09 MED ORDER — HEPARIN SOD (PORK) LOCK FLUSH 100 UNIT/ML IV SOLN
500.0000 [IU] | Freq: Once | INTRAVENOUS | Status: AC | PRN
  Administered 2019-02-09: 500 [IU]
  Filled 2019-02-09: qty 5

## 2019-02-09 MED ORDER — SODIUM CHLORIDE 0.9% FLUSH
10.0000 mL | INTRAVENOUS | Status: DC | PRN
  Administered 2019-02-09: 12:00:00 10 mL
  Filled 2019-02-09: qty 10

## 2019-02-09 MED ORDER — SODIUM CHLORIDE 0.9 % IV SOLN
Freq: Once | INTRAVENOUS | Status: AC
  Administered 2019-02-09: 10:00:00 via INTRAVENOUS
  Filled 2019-02-09: qty 250

## 2019-02-09 MED ORDER — DOXORUBICIN HCL CHEMO IV INJECTION 2 MG/ML
60.0000 mg/m2 | Freq: Once | INTRAVENOUS | Status: AC
  Administered 2019-02-09: 11:00:00 94 mg via INTRAVENOUS
  Filled 2019-02-09: qty 47

## 2019-02-09 MED ORDER — PALONOSETRON HCL INJECTION 0.25 MG/5ML
INTRAVENOUS | Status: AC
  Filled 2019-02-09: qty 5

## 2019-02-09 NOTE — Assessment & Plan Note (Signed)
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 Adjuvant radiation therapy because of the tumor size being large 3. Followed by adjuvant antiestrogen therapy with tamoxifen 20 mg daily x10 years ------------------------------------------------------------------------------------------------------------------- Current Treatment: Cycle 4 day 1 Adriamycin and Cytoxan ECHO feb 2020: EF 60-65% Labs reviewed  Chemo toxicities: 1.Nausea and vomiting: Resolved see some 2.Headache: Could be related to Zofran versus migraine. 3.Fatigue as expected from chemo. 4.  Loss of taste 5.  Hair loss Return to clinic in 2 weeks for cycle 1 of Taxol

## 2019-02-09 NOTE — Telephone Encounter (Signed)
No 4/3 los

## 2019-02-09 NOTE — Patient Instructions (Signed)
[facility] Discharge Instructions for Patients Receiving Chemotherapy  Today you received the following chemotherapy agents Doxorubicin (ADRIAMYCIN) & Cyclophosphamide (CYTOXAN).  To help prevent nausea and vomiting after your treatment, we encourage you to take your nausea medication as prescribed.   If you develop nausea and vomiting that is not controlled by your nausea medication, call the clinic.   BELOW ARE SYMPTOMS THAT SHOULD BE REPORTED IMMEDIATELY:  *FEVER GREATER THAN 100.5 F  *CHILLS WITH OR WITHOUT FEVER  NAUSEA AND VOMITING THAT IS NOT CONTROLLED WITH YOUR NAUSEA MEDICATION  *UNUSUAL SHORTNESS OF BREATH  *UNUSUAL BRUISING OR BLEEDING  TENDERNESS IN MOUTH AND THROAT WITH OR WITHOUT PRESENCE OF ULCERS  *URINARY PROBLEMS  *BOWEL PROBLEMS  UNUSUAL RASH Items with * indicate a potential emergency and should be followed up as soon as possible.  Feel free to call the clinic should you have any questions or concerns. The clinic phone number is [phone].  Please show the CHEMO ALERT CARD at check-in to the Emergency Department and triage nurse.  Coronavirus (COVID-19) Are you at risk?  Are you at risk for the Coronavirus (COVID-19)?  To be considered HIGH RISK for Coronavirus (COVID-19), you have to meet the following criteria:  . Traveled to [place], [place], [place], [place] or [place]; or in the [place] to [place], [place], [place], or [place]; and have fever, cough, and shortness of breath within the last 2 weeks of travel OR . Been in close contact with a person diagnosed with COVID-19 within the last 2 weeks and have fever, cough, and shortness of breath . IF YOU DO NOT MEET THESE CRITERIA, YOU ARE CONSIDERED LOW RISK FOR COVID-19.  What to do if you are HIGH RISK for COVID-19?  . If you are having a medical emergency, call 911. . Seek medical care right away. Before you go to a doctor's office, urgent care or emergency  department, call ahead and tell them about your recent travel, contact with someone diagnosed with COVID-19, and your symptoms. You should receive instructions from your physician's office regarding next steps of care.  . When you arrive at healthcare provider, tell the healthcare staff immediately you have returned from visiting [place], [place], [place], [place] or [place]; or traveled in the [place] to [place], [place], [place], or [place]; in the last two weeks or you have been in close contact with a person diagnosed with COVID-19 in the last 2 weeks.   . Tell the health care staff about your symptoms: fever, cough and shortness of breath. . After you have been seen by a medical provider, you will be either: o Tested for (COVID-19) and discharged home on quarantine except to seek medical care if symptoms worsen, and asked to  - Stay home and avoid contact with others until you get your results (4-5 days)  - Avoid travel on public transportation if possible (such as bus, train, or airplane) or o Sent to the Emergency Department by EMS for evaluation, COVID-19 testing, and possible admission depending on your condition and test results.  What to do if you are LOW RISK for COVID-19?  Reduce your risk of any infection by using the same precautions used for avoiding the common cold or flu:  . Wash your hands often with soap and warm water for at least 20 seconds.  If soap and water are not readily available, use an alcohol-based hand sanitizer with at least 60% alcohol.  .   If coughing or sneezing, cover your mouth and nose by coughing or sneezing into the elbow areas of your shirt or coat, into a tissue or into your sleeve (not your hands). . Avoid shaking hands with others and consider head nods or verbal greetings only. . Avoid touching your eyes, nose, or mouth with unwashed hands.  . Avoid close contact with people who are sick. . Avoid places or events with large numbers of people  in one location, like concerts or sporting events. . Carefully consider travel plans you have or are making. . If you are planning any travel outside or inside the [place], visit the CDC's Travelers' Health webpage for the latest health notices. . If you have some symptoms but not all symptoms, continue to monitor at home and seek medical attention if your symptoms worsen. . If you are having a medical emergency, call 911.   [place] / e-Visit: [email or web address]         [facility]: [facility]: [phone]                   [facility]: [phone]

## 2019-02-12 ENCOUNTER — Inpatient Hospital Stay: Payer: BLUE CROSS/BLUE SHIELD

## 2019-02-12 ENCOUNTER — Other Ambulatory Visit: Payer: Self-pay

## 2019-02-12 DIAGNOSIS — Z170 Estrogen receptor positive status [ER+]: Principal | ICD-10-CM

## 2019-02-12 DIAGNOSIS — C50011 Malignant neoplasm of nipple and areola, right female breast: Secondary | ICD-10-CM | POA: Diagnosis not present

## 2019-02-12 MED ORDER — PEGFILGRASTIM-CBQV 6 MG/0.6ML ~~LOC~~ SOSY
PREFILLED_SYRINGE | SUBCUTANEOUS | Status: AC
  Filled 2019-02-12: qty 0.6

## 2019-02-12 MED ORDER — PEGFILGRASTIM-CBQV 6 MG/0.6ML ~~LOC~~ SOSY
6.0000 mg | PREFILLED_SYRINGE | Freq: Once | SUBCUTANEOUS | Status: AC
  Administered 2019-02-12: 6 mg via SUBCUTANEOUS

## 2019-02-12 NOTE — Patient Instructions (Signed)
Pegfilgrastim injection  What is this medicine?  PEGFILGRASTIM (PEG fil gra stim) is a long-acting granulocyte colony-stimulating factor that stimulates the growth of neutrophils, a type of white blood cell important in the body's fight against infection. It is used to reduce the incidence of fever and infection in patients with certain types of cancer who are receiving chemotherapy that affects the bone marrow, and to increase survival after being exposed to high doses of radiation.  This medicine may be used for other purposes; ask your health care provider or pharmacist if you have questions.  COMMON BRAND NAME(S): Fulphila, Neulasta, UDENYCA  What should I tell my health care provider before I take this medicine?  They need to know if you have any of these conditions:  -kidney disease  -latex allergy  -ongoing radiation therapy  -sickle cell disease  -skin reactions to acrylic adhesives (On-Body Injector only)  -an unusual or allergic reaction to pegfilgrastim, filgrastim, other medicines, foods, dyes, or preservatives  -pregnant or trying to get pregnant  -breast-feeding  How should I use this medicine?  This medicine is for injection under the skin. If you get this medicine at home, you will be taught how to prepare and give the pre-filled syringe or how to use the On-body Injector. Refer to the patient Instructions for Use for detailed instructions. Use exactly as directed. Tell your healthcare provider immediately if you suspect that the On-body Injector may not have performed as intended or if you suspect the use of the On-body Injector resulted in a missed or partial dose.  It is important that you put your used needles and syringes in a special sharps container. Do not put them in a trash can. If you do not have a sharps container, call your pharmacist or healthcare provider to get one.  Talk to your pediatrician regarding the use of this medicine in children. While this drug may be prescribed for  selected conditions, precautions do apply.  Overdosage: If you think you have taken too much of this medicine contact a poison control center or emergency room at once.  NOTE: This medicine is only for you. Do not share this medicine with others.  What if I miss a dose?  It is important not to miss your dose. Call your doctor or health care professional if you miss your dose. If you miss a dose due to an On-body Injector failure or leakage, a new dose should be administered as soon as possible using a single prefilled syringe for manual use.  What may interact with this medicine?  Interactions have not been studied.  Give your health care provider a list of all the medicines, herbs, non-prescription drugs, or dietary supplements you use. Also tell them if you smoke, drink alcohol, or use illegal drugs. Some items may interact with your medicine.  This list may not describe all possible interactions. Give your health care provider a list of all the medicines, herbs, non-prescription drugs, or dietary supplements you use. Also tell them if you smoke, drink alcohol, or use illegal drugs. Some items may interact with your medicine.  What should I watch for while using this medicine?  You may need blood work done while you are taking this medicine.  If you are going to need a MRI, CT scan, or other procedure, tell your doctor that you are using this medicine (On-Body Injector only).  What side effects may I notice from receiving this medicine?  Side effects that you should report to   your doctor or health care professional as soon as possible:  -allergic reactions like skin rash, itching or hives, swelling of the face, lips, or tongue  -back pain  -dizziness  -fever  -pain, redness, or irritation at site where injected  -pinpoint red spots on the skin  -red or dark-brown urine  -shortness of breath or breathing problems  -stomach or side pain, or pain at the shoulder  -swelling  -tiredness  -trouble passing urine or  change in the amount of urine  Side effects that usually do not require medical attention (report to your doctor or health care professional if they continue or are bothersome):  -bone pain  -muscle pain  This list may not describe all possible side effects. Call your doctor for medical advice about side effects. You may report side effects to FDA at 1-800-FDA-1088.  Where should I keep my medicine?  Keep out of the reach of children.  If you are using this medicine at home, you will be instructed on how to store it. Throw away any unused medicine after the expiration date on the label.  NOTE: This sheet is a summary. It may not cover all possible information. If you have questions about this medicine, talk to your doctor, pharmacist, or health care provider.   2019 Elsevier/Gold Standard (2018-01-30 16:57:08)

## 2019-02-22 NOTE — Progress Notes (Signed)
Patient Care Team: Newton Pigg, MD as PCP - General (Obstetrics and Gynecology)  DIAGNOSIS:    ICD-10-CM   1. 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, estrogen receptor positive ([place])   09/30/2018 Surgery    Right mastectomy with axillary lymph node dissection in [place]: Grade 2 IDC, 5.3 cm, DCIS, margins negative, lymphovascular invasion present, 0/18 lymph nodes, ER 60%, PR 0%, HER-2 negative, Ki-67 90%, T3N0 stage II B    11/15/2018 Cancer Staging    Staging form: Breast, AJCC 8th Edition - Pathologic: Stage IIB (pT3, pN0, cM0, G2, ER+, PR-, HER2-) - Signed by Nicholas Lose, MD on 11/15/2018    11/29/2018 Oncotype testing    Oncotype DX score 28: 17% risk of distant recurrence at 9 years    12/08/2018 Genetic Testing    Negative genetic testing on the common hereditary cancer panel.  The Hereditary Gene Panel offered by [facility] includes sequencing and/or deletion duplication testing of the following 47 genes: APC, ATM, AXIN2, BARD1, BMPR1A, BRCA1, BRCA2, BRIP1, CDH1, CDK4, CDKN2A (p14ARF), CDKN2A (p16INK4a), CHEK2, CTNNA1, DICER1, EPCAM (Deletion/duplication testing only), GREM1 (promoter region deletion/duplication testing only), KIT, MEN1, MLH1, MSH2, MSH3, MSH6, MUTYH, NBN, NF1, NHTL1, PALB2, PDGFRA, PMS2, POLD1, POLE, PTEN, RAD50, RAD51C, RAD51D, SDHB, SDHC, SDHD, SMAD4, SMARCA4. STK11, TP53, TSC1, TSC2, and VHL.  The following genes were evaluated for sequence changes only: SDHA and HOXB13 c.251G>A variant only. The report date is December 08, 2018.    12/29/2018 -  Chemotherapy    Adjuvant chemotherapy with dose dense Adriamycin and Cytoxan followed by Taxol      CHIEF COMPLIANT: Cycle 1 Taxol  INTERVAL HISTORY: Ariana Luna is a 43 y.o. with above-mentioned history  of right breast cancer treated with right mastectomy in [place], and who completed 4 cycles of adjuvant chemotherapy withdose denseAdriamycinand Cytoxan and presents to the clinic todaywith an interpreter for cycle 1 of weekly Taxol.  She was able to tolerate the chemotherapy extremely well until now.  Denies any nausea or vomiting.  Denies any fevers or chills.  Denies any severe fatigue.  REVIEW OF SYSTEMS:   Constitutional: Denies fevers, chills or abnormal weight loss Eyes: Denies blurriness of vision Ears, nose, mouth, throat, and face: Denies mucositis or sore throat Respiratory: Denies cough, dyspnea or wheezes Cardiovascular: Denies palpitation, chest discomfort Gastrointestinal: Denies nausea, heartburn or change in bowel habits Skin: Denies abnormal skin rashes Lymphatics: Denies new lymphadenopathy or easy bruising Neurological: Denies numbness, tingling or new weaknesses Behavioral/Psych: Mood is stable, no new changes  Extremities: No lower extremity edema Breast: denies any pain or lumps or nodules in either breasts All other systems were reviewed with the patient and are negative.  I have reviewed the past medical history, past surgical history, social history and family history with the patient and they are unchanged from previous note.  ALLERGIES:  has No Known Allergies.  MEDICATIONS:  Current Outpatient Medications  Medication Sig Dispense Refill  . dexamethasone (DECADRON) 4 MG tablet Take 1 tablet (4 mg total) by mouth daily. Take 1 tablet day after chemo and 1 tablet 2 days after chemo with food 8 tablet 0  . lidocaine-prilocaine (EMLA) cream Apply 1 application topically as needed. 30 g 0  . lidocaine-prilocaine (EMLA) cream Apply to affected area once 30 g  3  . LORazepam (ATIVAN) 0.5 MG tablet Take 1 tablet (0.5 mg total) by mouth at bedtime as needed for sleep. 30 tablet 0  . ondansetron (ZOFRAN) 8 MG tablet Take 1 tablet (8 mg total) by mouth 2 (two) times  daily as needed. Start on the third day after chemotherapy. 30 tablet 1  . prochlorperazine (COMPAZINE) 10 MG tablet Take 1 tablet (10 mg total) by mouth every 6 (six) hours as needed (Nausea or vomiting). 30 tablet 1   No current facility-administered medications for this visit.     PHYSICAL EXAMINATION: ECOG PERFORMANCE STATUS: 1 - Symptomatic but completely ambulatory  Vitals:   02/23/19 0902  BP: 101/69  Pulse: 86  Resp: 18  Temp: 98.5 F (36.9 C)  SpO2: 100%   Filed Weights   02/23/19 0902  Weight: 114 lb 6.4 oz (51.9 kg)    GENERAL: alert, no distress and comfortable SKIN: skin color, texture, turgor are normal, no rashes or significant lesions EYES: normal, Conjunctiva are pink and non-injected, sclera clear OROPHARYNX: no exudate, no erythema and lips, buccal mucosa, and tongue normal  NECK: supple, thyroid normal size, non-tender, without nodularity LYMPH: no palpable lymphadenopathy in the cervical, axillary or inguinal LUNGS: clear to auscultation and percussion with normal breathing effort HEART: regular rate & rhythm and no murmurs and no lower extremity edema ABDOMEN: abdomen soft, non-tender and normal bowel sounds MUSCULOSKELETAL: no cyanosis of digits and no clubbing  NEURO: alert & oriented x 3 with fluent speech, no focal motor/sensory deficits EXTREMITIES: No lower extremity edema  LABORATORY DATA:  I have reviewed the data as listed CMP Latest Ref Rng & Units 02/09/2019 01/25/2019 01/12/2019  Glucose 70 - 99 mg/dL 96 94 79  BUN 6 - 20 mg/dL 11 5(L) 9  Creatinine 0.44 - 1.00 mg/dL 0.67 0.69 0.87  Sodium 135 - 145 mmol/L 139 141 138  Potassium 3.5 - 5.1 mmol/L 3.9 3.8 3.8  Chloride 98 - 111 mmol/L 105 108 103  CO2 22 - 32 mmol/L 25 25 27   Calcium 8.9 - 10.3 mg/dL 8.9 8.5(L) 9.3  Total Protein 6.5 - 8.1 g/dL 7.4 7.0 7.6  Total Bilirubin 0.3 - 1.2 mg/dL 0.3 <0.2(L) 0.2(L)  Alkaline Phos 38 - 126 U/L 94 81 80  AST 15 - 41 U/L 20 19 21   ALT 0 - 44 U/L 22  22 24     Lab Results  Component Value Date   WBC 9.4 02/09/2019   HGB 11.3 (L) 02/09/2019   HCT 35.2 (L) 02/09/2019   MCV 88.7 02/09/2019   PLT 224 02/09/2019   NEUTROABS 7.5 02/09/2019    ASSESSMENT & PLAN:  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 Adjuvant radiation therapy because of the tumor size being large 3. Followed by adjuvant antiestrogen therapy with tamoxifen 20 mg daily x10 years ------------------------------------------------------------------------------------------------------------------- Current Treatment: Completed 4 cycles of Adriamycin and Cytoxan, today cycle 1 Taxol ECHO feb 2020: EF 60-65% Labs reviewed  Chemo toxicities: 1.Nausea and vomiting: Resolved  2.Headache: Could be related to Zofranversus migraine. 3.Fatigue as expected from chemo. 4.Loss of taste 5.Hair loss  Return to  clinic in1weeksfor cycle 2 of Taxol  No orders of the defined types were placed in this encounter.  The patient has a good understanding of the overall plan. she agrees with it. she will call with any problems that may develop before the next visit here.  Nicholas Lose, MD 02/23/2019  Julious Oka Dorshimer am acting as scribe for Dr. Nicholas Lose.  I have reviewed the above documentation for accuracy and completeness, and I agree with the above.

## 2019-02-23 ENCOUNTER — Inpatient Hospital Stay: Payer: BLUE CROSS/BLUE SHIELD

## 2019-02-23 ENCOUNTER — Inpatient Hospital Stay (HOSPITAL_BASED_OUTPATIENT_CLINIC_OR_DEPARTMENT_OTHER): Payer: BLUE CROSS/BLUE SHIELD | Admitting: Hematology and Oncology

## 2019-02-23 ENCOUNTER — Other Ambulatory Visit: Payer: Self-pay

## 2019-02-23 VITALS — BP 90/61 | HR 88 | Temp 98.9°F | Resp 18

## 2019-02-23 DIAGNOSIS — Z95828 Presence of other vascular implants and grafts: Secondary | ICD-10-CM

## 2019-02-23 DIAGNOSIS — Z923 Personal history of irradiation: Secondary | ICD-10-CM

## 2019-02-23 DIAGNOSIS — C50011 Malignant neoplasm of nipple and areola, right female breast: Secondary | ICD-10-CM | POA: Diagnosis not present

## 2019-02-23 DIAGNOSIS — R5383 Other fatigue: Secondary | ICD-10-CM

## 2019-02-23 DIAGNOSIS — Z5111 Encounter for antineoplastic chemotherapy: Secondary | ICD-10-CM | POA: Diagnosis not present

## 2019-02-23 DIAGNOSIS — R51 Headache: Secondary | ICD-10-CM

## 2019-02-23 DIAGNOSIS — Z7689 Persons encountering health services in other specified circumstances: Secondary | ICD-10-CM

## 2019-02-23 DIAGNOSIS — Z9011 Acquired absence of right breast and nipple: Secondary | ICD-10-CM

## 2019-02-23 DIAGNOSIS — Z9221 Personal history of antineoplastic chemotherapy: Secondary | ICD-10-CM

## 2019-02-23 DIAGNOSIS — Z170 Estrogen receptor positive status [ER+]: Principal | ICD-10-CM

## 2019-02-23 DIAGNOSIS — Z79899 Other long term (current) drug therapy: Secondary | ICD-10-CM

## 2019-02-23 LAB — CMP (CANCER CENTER ONLY)
ALT: 29 U/L (ref 0–44)
AST: 23 U/L (ref 15–41)
Albumin: 4 g/dL (ref 3.5–5.0)
Alkaline Phosphatase: 140 U/L — ABNORMAL HIGH (ref 38–126)
Anion gap: 9 (ref 5–15)
BUN: 8 mg/dL (ref 6–20)
CO2: 25 mmol/L (ref 22–32)
Calcium: 9 mg/dL (ref 8.9–10.3)
Chloride: 106 mmol/L (ref 98–111)
Creatinine: 0.66 mg/dL (ref 0.44–1.00)
GFR, Est AFR Am: >60 mL/min (ref >60)
GFR, Estimated: >60 mL/min (ref >60)
Glucose, Bld: 108 mg/dL — ABNORMAL HIGH (ref 70–99)
Potassium: 3.8 mmol/L (ref 3.5–5.1)
Sodium: 140 mmol/L (ref 135–145)
Total Bilirubin: 0.3 mg/dL (ref 0.3–1.2)
Total Protein: 7.6 g/dL (ref 6.5–8.1)

## 2019-02-23 LAB — CBC WITH DIFFERENTIAL (CANCER CENTER ONLY)
Abs Immature Granulocytes: 0.17 10*3/uL — ABNORMAL HIGH (ref 0.00–0.07)
Basophils Absolute: 0 10*3/uL (ref 0.0–0.1)
Basophils Relative: 0 %
Eosinophils Absolute: 0 10*3/uL (ref 0.0–0.5)
Eosinophils Relative: 0 %
HCT: 33.1 % — ABNORMAL LOW (ref 36.0–46.0)
Hemoglobin: 10.6 g/dL — ABNORMAL LOW (ref 12.0–15.0)
Immature Granulocytes: 2 %
Lymphocytes Relative: 7 %
Lymphs Abs: 0.6 10*3/uL — ABNORMAL LOW (ref 0.7–4.0)
MCH: 28.7 pg (ref 26.0–34.0)
MCHC: 32 g/dL (ref 30.0–36.0)
MCV: 89.7 fL (ref 80.0–100.0)
Monocytes Absolute: 0.7 10*3/uL (ref 0.1–1.0)
Monocytes Relative: 9 %
Neutro Abs: 7 10*3/uL (ref 1.7–7.7)
Neutrophils Relative %: 82 %
Platelet Count: 133 10*3/uL — ABNORMAL LOW (ref 150–400)
RBC: 3.69 MIL/uL — ABNORMAL LOW (ref 3.87–5.11)
RDW: 16.8 % — ABNORMAL HIGH (ref 11.5–15.5)
WBC Count: 8.6 10*3/uL (ref 4.0–10.5)
nRBC: 0 % (ref 0.0–0.2)

## 2019-02-23 MED ORDER — DIPHENHYDRAMINE HCL 50 MG/ML IJ SOLN
50.0000 mg | Freq: Once | INTRAMUSCULAR | Status: AC
  Administered 2019-02-23: 50 mg via INTRAVENOUS

## 2019-02-23 MED ORDER — SODIUM CHLORIDE 0.9 % IV SOLN
20.0000 mg | Freq: Once | INTRAVENOUS | Status: AC
  Administered 2019-02-23: 20 mg via INTRAVENOUS
  Filled 2019-02-23: qty 2

## 2019-02-23 MED ORDER — ACETAMINOPHEN 325 MG PO TABS
650.0000 mg | ORAL_TABLET | Freq: Once | ORAL | Status: AC
  Administered 2019-02-23: 650 mg via ORAL

## 2019-02-23 MED ORDER — ACETAMINOPHEN 325 MG PO TABS
ORAL_TABLET | ORAL | Status: AC
  Filled 2019-02-23: qty 2

## 2019-02-23 MED ORDER — SODIUM CHLORIDE 0.9 % IV SOLN
Freq: Once | INTRAVENOUS | Status: AC
  Administered 2019-02-23: 11:00:00 via INTRAVENOUS
  Filled 2019-02-23: qty 250

## 2019-02-23 MED ORDER — HEPARIN SOD (PORK) LOCK FLUSH 100 UNIT/ML IV SOLN
500.0000 [IU] | Freq: Once | INTRAVENOUS | Status: AC | PRN
  Administered 2019-02-23: 500 [IU]
  Filled 2019-02-23: qty 5

## 2019-02-23 MED ORDER — SODIUM CHLORIDE 0.9% FLUSH
10.0000 mL | INTRAVENOUS | Status: DC | PRN
  Administered 2019-02-23: 09:00:00 10 mL
  Filled 2019-02-23: qty 10

## 2019-02-23 MED ORDER — SODIUM CHLORIDE 0.9 % IV SOLN
80.0000 mg/m2 | Freq: Once | INTRAVENOUS | Status: AC
  Administered 2019-02-23: 126 mg via INTRAVENOUS
  Filled 2019-02-23: qty 21

## 2019-02-23 MED ORDER — DIPHENHYDRAMINE HCL 50 MG/ML IJ SOLN
INTRAMUSCULAR | Status: AC
  Filled 2019-02-23: qty 1

## 2019-02-23 MED ORDER — SODIUM CHLORIDE 0.9 % IV SOLN
20.0000 mg | Freq: Once | INTRAVENOUS | Status: DC
  Filled 2019-02-23: qty 2

## 2019-02-23 MED ORDER — SODIUM CHLORIDE 0.9% FLUSH
10.0000 mL | INTRAVENOUS | Status: DC | PRN
  Administered 2019-02-23: 10 mL
  Filled 2019-02-23: qty 10

## 2019-02-23 MED ORDER — SODIUM CHLORIDE 0.9 % IV SOLN
20.0000 mg | Freq: Once | INTRAVENOUS | Status: AC
  Administered 2019-02-23: 11:00:00 20 mg via INTRAVENOUS
  Filled 2019-02-23: qty 20

## 2019-02-23 NOTE — Patient Instructions (Addendum)
[facility] Discharge Instructions for Patients Receiving Chemotherapy  Today you received the following chemotherapy agents Taxol  To help prevent nausea and vomiting after your treatment, we encourage you to take your nausea medication as prescribed.   If you develop nausea and vomiting that is not controlled by your nausea medication, call the clinic.   BELOW ARE SYMPTOMS THAT SHOULD BE REPORTED IMMEDIATELY:  *FEVER GREATER THAN 100.5 F  *CHILLS WITH OR WITHOUT FEVER  NAUSEA AND VOMITING THAT IS NOT CONTROLLED WITH YOUR NAUSEA MEDICATION  *UNUSUAL SHORTNESS OF BREATH  *UNUSUAL BRUISING OR BLEEDING  TENDERNESS IN MOUTH AND THROAT WITH OR WITHOUT PRESENCE OF ULCERS  *URINARY PROBLEMS  *BOWEL PROBLEMS  UNUSUAL RASH Items with * indicate a potential emergency and should be followed up as soon as possible.  Feel free to call the clinic should you have any questions or concerns. The clinic phone number is [phone].  Please show the [facility] at check-in to the Emergency Department and triage nurse.  Paclitaxel injection (taxol) What is this medicine? PACLITAXEL (PAK li TAX el) is a chemotherapy drug. It targets fast dividing cells, like cancer cells, and causes these cells to die. This medicine is used to treat ovarian cancer, breast cancer, lung cancer, Kaposi's sarcoma, and other cancers. This medicine may be used for other purposes; ask your health care provider or pharmacist if you have questions. COMMON BRAND NAME(S): Onxol, Taxol What should I tell my health care provider before I take this medicine? They need to know if you have any of these conditions: -history of irregular heartbeat -liver disease -low blood counts, like low white cell, platelet, or red cell counts -lung or breathing disease, like asthma -tingling of the fingers or toes, or other nerve disorder -an unusual or allergic reaction to paclitaxel, alcohol, polyoxyethylated  castor oil, other chemotherapy, other medicines, foods, dyes, or preservatives -pregnant or trying to get pregnant -breast-feeding How should I use this medicine? This drug is given as an infusion into a vein. It is administered in a hospital or clinic by a specially trained health care professional. Talk to your pediatrician regarding the use of this medicine in children. Special care may be needed. Overdosage: If you think you have taken too much of this medicine contact a poison control center or emergency room at once. NOTE: This medicine is only for you. Do not share this medicine with others. What if I miss a dose? It is important not to miss your dose. Call your doctor or health care professional if you are unable to keep an appointment. What may interact with this medicine? Do not take this medicine with any of the following medications: -disulfiram -metronidazole This medicine may also interact with the following medications: -antiviral medicines for hepatitis, HIV or AIDS -certain antibiotics like erythromycin and clarithromycin -certain medicines for fungal infections like ketoconazole and itraconazole -certain medicines for seizures like carbamazepine, phenobarbital, phenytoin -gemfibrozil -nefazodone -rifampin -St. John's wort This list may not describe all possible interactions. Give your health care provider a list of all the medicines, herbs, non-prescription drugs, or dietary supplements you use. Also tell them if you smoke, drink alcohol, or use illegal drugs. Some items may interact with your medicine. What should I watch for while using this medicine? Your condition will be monitored carefully while you are receiving this medicine. You will need important blood work done while you are taking this medicine. This medicine can cause serious allergic reactions. To reduce your  risk you will need to take other medicine(s) before treatment with this medicine. If you experience  allergic reactions like skin rash, itching or hives, swelling of the face, lips, or tongue, tell your doctor or health care professional right away. In some cases, you may be given additional medicines to help with side effects. Follow all directions for their use. This drug may make you feel generally unwell. This is not uncommon, as chemotherapy can affect healthy cells as well as cancer cells. Report any side effects. Continue your course of treatment even though you feel ill unless your doctor tells you to stop. Call your doctor or health care professional for advice if you get a fever, chills or sore throat, or other symptoms of a cold or flu. Do not treat yourself. This drug decreases your body's ability to fight infections. Try to avoid being around people who are sick. This medicine may increase your risk to bruise or bleed. Call your doctor or health care professional if you notice any unusual bleeding. Be careful brushing and flossing your teeth or using a toothpick because you may get an infection or bleed more easily. If you have any dental work done, tell your dentist you are receiving this medicine. Avoid taking products that contain aspirin, acetaminophen, ibuprofen, naproxen, or ketoprofen unless instructed by your doctor. These medicines may hide a fever. Do not become pregnant while taking this medicine. Women should inform their doctor if they wish to become pregnant or think they might be pregnant. There is a potential for serious side effects to an unborn child. Talk to your health care professional or pharmacist for more information. Do not breast-feed an infant while taking this medicine. Men are advised not to father a child while receiving this medicine. This product may contain alcohol. Ask your pharmacist or healthcare provider if this medicine contains alcohol. Be sure to tell all healthcare providers you are taking this medicine. Certain medicines, like metronidazole and  disulfiram, can cause an unpleasant reaction when taken with alcohol. The reaction includes flushing, headache, nausea, vomiting, sweating, and increased thirst. The reaction can last from 30 minutes to several hours. What side effects may I notice from receiving this medicine? Side effects that you should report to your doctor or health care professional as soon as possible: -allergic reactions like skin rash, itching or hives, swelling of the face, lips, or tongue -breathing problems -changes in vision -fast, irregular heartbeat -high or low blood pressure -mouth sores -pain, tingling, numbness in the hands or feet -signs of decreased platelets or bleeding - bruising, pinpoint red spots on the skin, black, tarry stools, blood in the urine -signs of decreased red blood cells - unusually weak or tired, feeling faint or lightheaded, falls -signs of infection - fever or chills, cough, sore throat, pain or difficulty passing urine -signs and symptoms of liver injury like dark yellow or brown urine; general ill feeling or flu-like symptoms; light-colored stools; loss of appetite; nausea; right upper belly pain; unusually weak or tired; yellowing of the eyes or skin -swelling of the ankles, feet, hands -unusually slow heartbeat Side effects that usually do not require medical attention (report to your doctor or health care professional if they continue or are bothersome): -diarrhea -hair loss -loss of appetite -muscle or joint pain -nausea, vomiting -pain, redness, or irritation at site where injected -tiredness This list may not describe all possible side effects. Call your doctor for medical advice about side effects. You may report side effects to  FDA at 1-800-FDA-1088. Where should I keep my medicine? This drug is given in a hospital or clinic and will not be stored at home. NOTE: This sheet is a summary. It may not cover all possible information. If you have questions about this medicine,  talk to your doctor, pharmacist, or health care provider.  2019 Elsevier/Gold Standard (2017-06-28 13:14:55)   Coronavirus (COVID-19) Are you at risk?  Are you at risk for the Coronavirus (COVID-19)?  To be considered HIGH RISK for Coronavirus (COVID-19), you have to meet the following criteria:  . Traveled to [place], [place], [place], [place] or [place]; or in the [place] to [place], [place], [place], or [place]; and have fever, cough, and shortness of breath within the last 2 weeks of travel OR . Been in close contact with a person diagnosed with COVID-19 within the last 2 weeks and have fever, cough, and shortness of breath . IF YOU DO NOT MEET THESE CRITERIA, YOU ARE CONSIDERED LOW RISK FOR COVID-19.  What to do if you are HIGH RISK for COVID-19?  Marland Kitchen If you are having a medical emergency, call 911. . Seek medical care right away. Before you go to a doctor's office, urgent care or emergency department, call ahead and tell them about your recent travel, contact with someone diagnosed with COVID-19, and your symptoms. You should receive instructions from your physician's office regarding next steps of care.  . When you arrive at healthcare provider, tell the healthcare staff immediately you have returned from visiting [place], [place], [place], [place] or [place]; or traveled in the [place] to [place], [place], [place], or [place]; in the last two weeks or you have been in close contact with a person diagnosed with COVID-19 in the last 2 weeks.   . Tell the health care staff about your symptoms: fever, cough and shortness of breath. . After you have been seen by a medical provider, you will be either: o Tested for (COVID-19) and discharged home on quarantine except to seek medical care if symptoms worsen, and asked to  - Stay home and avoid contact with others until you get your results (4-5 days)  - Avoid travel on public transportation if possible (such as bus,  train, or airplane) or o Sent to the Emergency Department by EMS for evaluation, COVID-19 testing, and possible admission depending on your condition and test results.  What to do if you are LOW RISK for COVID-19?  Reduce your risk of any infection by using the same precautions used for avoiding the common cold or flu:  Marland Kitchen Wash your hands often with soap and warm water for at least 20 seconds.  If soap and water are not readily available, use an alcohol-based hand sanitizer with at least 60% alcohol.  . If coughing or sneezing, cover your mouth and nose by coughing or sneezing into the elbow areas of your shirt or coat, into a tissue or into your sleeve (not your hands). . Avoid shaking hands with others and consider head nods or verbal greetings only. . Avoid touching your eyes, nose, or mouth with unwashed hands.  . Avoid close contact with people who are sick. . Avoid places or events with large numbers of people in one location, like concerts or sporting events. . Carefully consider travel plans you have or are making. . If you are planning any travel outside or inside the [place], visit the CDC's Travelers' Health webpage for the latest health notices. Marland Kitchen  If you have some symptoms but not all symptoms, continue to monitor at home and seek medical attention if your symptoms worsen. . If you are having a medical emergency, call 911.   ADDITIONAL HEALTHCARE OPTIONS FOR PATIENTS  [facility] / e-Visit: [email or web address]         [facility]: [phone]  [facility]: [phone]                   [facility]: [phone]   

## 2019-02-23 NOTE — Assessment & Plan Note (Signed)
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 Adjuvant radiation therapy because of the tumor size being large 3. 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 3.Fatigue as expected from chemo. 4.Loss of taste 5.Hair loss  Return to clinic in1weeksfor cycle 2 of Taxol

## 2019-02-23 NOTE — Addendum Note (Signed)
Addended by: Nicholas Lose on: 02/23/2019 10:41 AM   Modules accepted: Orders

## 2019-02-23 NOTE — Progress Notes (Signed)
Infusion nurse notified of patient having headache after administration of Benadryl.  Orders obtained for Tylenol 650mg  once.

## 2019-02-26 ENCOUNTER — Telehealth: Payer: Self-pay | Admitting: *Deleted

## 2019-02-26 NOTE — Telephone Encounter (Signed)
Dialed [phone] to reach [facility] for chemotherapy F/U call.  Mother-in-law answered.  "I'm her mother-n-law.  She doesn't understand or speak English well."  Asked if Ariana Luna experiencing any changes or side effects.  N/V or eating and drinking well.  Constipation or diarrhea?  Pain?  Sleeping at night?  "I will go by to check on her today and call back.  She doesn't talk much but looks bad when I saw her Friday."

## 2019-02-26 NOTE — Telephone Encounter (Signed)
Attempt x1 to contact pt to follow up after first infusion of taxol. No answer, LVM.

## 2019-02-26 NOTE — Telephone Encounter (Signed)
-----   Message from Ishmael Holter, RN sent at 02/23/2019  3:37 PM EDT ----- Regarding: Dr. Lindi Adie 1st TX F/U call Dr. Lindi Adie 1st Taxol tx f/u call

## 2019-02-27 ENCOUNTER — Telehealth: Payer: Self-pay | Admitting: *Deleted

## 2019-02-27 NOTE — Telephone Encounter (Signed)
Received VM from pt mother in law stating she checked in on the pt and "was doing good, did not have any issues".  I attempted to call pt to follow up regarding her first treatment of chemo, no answer and LVM.

## 2019-02-28 NOTE — Progress Notes (Signed)
Patient Care Team: Newton Pigg, MD as PCP - General (Obstetrics and Gynecology)  DIAGNOSIS:    ICD-10-CM   1. 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  The Hereditary Gene Panel offered by [facility] includes sequencing and/or deletion duplication testing of the following 47 genes: APC, ATM, AXIN2, BARD1, BMPR1A, BRCA1, BRCA2, BRIP1, CDH1, CDK4, CDKN2A (p14ARF), CDKN2A (p16INK4a), CHEK2, CTNNA1, DICER1, EPCAM (Deletion/duplication testing only), GREM1 (promoter region deletion/duplication testing only), KIT, MEN1, MLH1, MSH2, MSH3, MSH6, MUTYH, NBN, NF1, NHTL1, PALB2, PDGFRA, PMS2, POLD1, POLE, PTEN, RAD50, RAD51C, RAD51D, SDHB, SDHC, SDHD, SMAD4, SMARCA4. STK11, TP53, TSC1, TSC2, and VHL.  The following genes were evaluated for sequence changes only: SDHA and HOXB13 c.251G>A variant only. The report date is December 08, 2018.    12/29/2018 -  Chemotherapy    Adjuvant chemotherapy with dose dense Adriamycin and Cytoxan followed by Taxol      CHIEF COMPLIANT: Cycle 2 Taxol  INTERVAL HISTORY: Ariana Luna is a 43 y.o. with above-mentioned history  of right breast cancer treated with right mastectomy in [place], and who completed 4 cycles of adjuvant chemotherapy withdose denseAdriamycinand Cytoxan and presents to the clinic todayfor cycle 2 of weekly Taxol.  She complains of intermittent muscle aches and pains.  During the chemotherapy infusion she felt shaky from Benadryl.  So we will reduce the Benadryl dose today.  REVIEW OF SYSTEMS:   Constitutional: Denies fevers, chills or abnormal weight loss Eyes: Denies blurriness of vision Ears, nose, mouth, throat, and face: Denies mucositis or sore throat Respiratory: Denies cough, dyspnea or wheezes Cardiovascular: Denies palpitation, chest discomfort Gastrointestinal: Denies nausea, heartburn or change in bowel habits Skin: Denies abnormal skin rashes Lymphatics: Denies new lymphadenopathy or easy bruising Neurological: Denies numbness, tingling or new weaknesses Behavioral/Psych: Mood is stable, no new changes  Extremities: No lower extremity edema Breast: denies any pain or lumps or nodules in either breasts All other systems were reviewed with the patient and are negative.  I have reviewed the past medical history, past surgical history, social history and family history with the patient and they are unchanged from previous note.  ALLERGIES:  has No Known Allergies.  MEDICATIONS:  Current Outpatient Medications  Medication Sig Dispense Refill  . lidocaine-prilocaine (EMLA) cream Apply 1 application topically as needed. 30 g 0  . lidocaine-prilocaine (EMLA) cream Apply to affected area once 30 g 3  . LORazepam (ATIVAN) 0.5 MG tablet Take 1 tablet (0.5 mg total) by mouth at bedtime as needed for sleep. 30 tablet 0  . ondansetron (ZOFRAN) 8 MG tablet Take 1 tablet (8 mg total)  by mouth 2 (two) times daily as needed. Start on the third day after chemotherapy. 30 tablet 1  . prochlorperazine (COMPAZINE) 10 MG tablet Take 1 tablet (10 mg total) by mouth every 6 (six) hours as needed  (Nausea or vomiting). 30 tablet 1   No current facility-administered medications for this visit.     PHYSICAL EXAMINATION: ECOG PERFORMANCE STATUS: 1 - Symptomatic but completely ambulatory  Vitals:   03/01/19 1013  BP: 104/67  Pulse: 88  Resp: 18  Temp: 98.1 F (36.7 C)  SpO2: 100%   Filed Weights   03/01/19 1013  Weight: 113 lb (51.3 kg)    GENERAL: alert, no distress and comfortable SKIN: skin color, texture, turgor are normal, no rashes or significant lesions EYES: normal, Conjunctiva are pink and non-injected, sclera clear OROPHARYNX: no exudate, no erythema and lips, buccal mucosa, and tongue normal  NECK: supple, thyroid normal size, non-tender, without nodularity LYMPH: no palpable lymphadenopathy in the cervical, axillary or inguinal LUNGS: clear to auscultation and percussion with normal breathing effort HEART: regular rate & rhythm and no murmurs and no lower extremity edema ABDOMEN: abdomen soft, non-tender and normal bowel sounds MUSCULOSKELETAL: no cyanosis of digits and no clubbing  NEURO: alert & oriented x 3 with fluent speech, no focal motor/sensory deficits EXTREMITIES: No lower extremity edema  LABORATORY DATA:  I have reviewed the data as listed CMP Latest Ref Rng & Units 03/01/2019 02/23/2019 02/09/2019  Glucose 70 - 99 mg/dL 102(H) 108(H) 96  BUN 6 - 20 mg/dL 9 8 11   Creatinine 0.44 - 1.00 mg/dL 0.64 0.66 0.67  Sodium 135 - 145 mmol/L 139 140 139  Potassium 3.5 - 5.1 mmol/L 3.6 3.8 3.9  Chloride 98 - 111 mmol/L 106 106 105  CO2 22 - 32 mmol/L 24 25 25   Calcium 8.9 - 10.3 mg/dL 8.7(L) 9.0 8.9  Total Protein 6.5 - 8.1 g/dL 7.2 7.6 7.4  Total Bilirubin 0.3 - 1.2 mg/dL 0.3 0.3 0.3  Alkaline Phos 38 - 126 U/L 143(H) 140(H) 94  AST 15 - 41 U/L 63(H) 23 20  ALT 0 - 44 U/L 85(H) 29 22    Lab Results  Component Value Date   WBC 7.0 03/01/2019   HGB 10.0 (L) 03/01/2019   HCT 31.0 (L) 03/01/2019   MCV 87.8 03/01/2019   PLT 270 03/01/2019   NEUTROABS  6.1 03/01/2019    ASSESSMENT & PLAN:  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 Adjuvant radiation therapy because of the tumor size being large 3. 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 3.Fatigue as expected from chemo. 4. Muscle aches and pains 5.  Benadryl related jitteriness: I will decrease the dosage of Benadryl.  We discussed extensively about the role of routine scans and labs for identification of breast cancer.  I informed her that labs did not show any useful information about breast  cancer recurrence.  Scans are not indicated unless she has any symptoms.  She will have surveillance mammograms on the left breast annually.  Return to clinic weekly for Taxol and every other week for follow-up with me.  No orders of the defined types were placed in this encounter.  The patient has a good understanding of the overall plan. she agrees with it. she will call with any problems that may develop before the next visit here.  Nicholas Lose, MD 03/01/2019  Julious Oka Dorshimer am acting as scribe for Dr. Nicholas Lose.  I have reviewed the above  documentation for accuracy and completeness, and I agree with the above.

## 2019-03-01 ENCOUNTER — Inpatient Hospital Stay: Payer: BLUE CROSS/BLUE SHIELD

## 2019-03-01 ENCOUNTER — Other Ambulatory Visit: Payer: Self-pay

## 2019-03-01 ENCOUNTER — Inpatient Hospital Stay (HOSPITAL_BASED_OUTPATIENT_CLINIC_OR_DEPARTMENT_OTHER): Payer: BLUE CROSS/BLUE SHIELD | Admitting: Hematology and Oncology

## 2019-03-01 DIAGNOSIS — Z9221 Personal history of antineoplastic chemotherapy: Secondary | ICD-10-CM

## 2019-03-01 DIAGNOSIS — C50011 Malignant neoplasm of nipple and areola, right female breast: Secondary | ICD-10-CM | POA: Diagnosis not present

## 2019-03-01 DIAGNOSIS — Z170 Estrogen receptor positive status [ER+]: Secondary | ICD-10-CM

## 2019-03-01 DIAGNOSIS — Z7689 Persons encountering health services in other specified circumstances: Secondary | ICD-10-CM

## 2019-03-01 DIAGNOSIS — R51 Headache: Secondary | ICD-10-CM | POA: Diagnosis not present

## 2019-03-01 DIAGNOSIS — R5383 Other fatigue: Secondary | ICD-10-CM

## 2019-03-01 DIAGNOSIS — Z79899 Other long term (current) drug therapy: Secondary | ICD-10-CM

## 2019-03-01 DIAGNOSIS — Z5111 Encounter for antineoplastic chemotherapy: Secondary | ICD-10-CM | POA: Diagnosis not present

## 2019-03-01 DIAGNOSIS — Z923 Personal history of irradiation: Secondary | ICD-10-CM

## 2019-03-01 DIAGNOSIS — Z95828 Presence of other vascular implants and grafts: Secondary | ICD-10-CM

## 2019-03-01 DIAGNOSIS — Z9011 Acquired absence of right breast and nipple: Secondary | ICD-10-CM

## 2019-03-01 LAB — CBC WITH DIFFERENTIAL (CANCER CENTER ONLY)
Abs Immature Granulocytes: 0.05 10*3/uL (ref 0.00–0.07)
Basophils Absolute: 0 10*3/uL (ref 0.0–0.1)
Basophils Relative: 1 %
Eosinophils Absolute: 0 10*3/uL (ref 0.0–0.5)
Eosinophils Relative: 0 %
HCT: 31 % — ABNORMAL LOW (ref 36.0–46.0)
Hemoglobin: 10 g/dL — ABNORMAL LOW (ref 12.0–15.0)
Immature Granulocytes: 1 %
Lymphocytes Relative: 6 %
Lymphs Abs: 0.4 10*3/uL — ABNORMAL LOW (ref 0.7–4.0)
MCH: 28.3 pg (ref 26.0–34.0)
MCHC: 32.3 g/dL (ref 30.0–36.0)
MCV: 87.8 fL (ref 80.0–100.0)
Monocytes Absolute: 0.4 10*3/uL (ref 0.1–1.0)
Monocytes Relative: 6 %
Neutro Abs: 6.1 10*3/uL (ref 1.7–7.7)
Neutrophils Relative %: 86 %
Platelet Count: 270 10*3/uL (ref 150–400)
RBC: 3.53 MIL/uL — ABNORMAL LOW (ref 3.87–5.11)
RDW: 16.9 % — ABNORMAL HIGH (ref 11.5–15.5)
WBC Count: 7 10*3/uL (ref 4.0–10.5)
nRBC: 0 % (ref 0.0–0.2)

## 2019-03-01 LAB — CMP (CANCER CENTER ONLY)
ALT: 85 U/L — ABNORMAL HIGH (ref 0–44)
AST: 63 U/L — ABNORMAL HIGH (ref 15–41)
Albumin: 3.8 g/dL (ref 3.5–5.0)
Alkaline Phosphatase: 143 U/L — ABNORMAL HIGH (ref 38–126)
Anion gap: 9 (ref 5–15)
BUN: 9 mg/dL (ref 6–20)
CO2: 24 mmol/L (ref 22–32)
Calcium: 8.7 mg/dL — ABNORMAL LOW (ref 8.9–10.3)
Chloride: 106 mmol/L (ref 98–111)
Creatinine: 0.64 mg/dL (ref 0.44–1.00)
GFR, Est AFR Am: >60 mL/min (ref >60)
GFR, Estimated: >60 mL/min (ref >60)
Glucose, Bld: 102 mg/dL — ABNORMAL HIGH (ref 70–99)
Potassium: 3.6 mmol/L (ref 3.5–5.1)
Sodium: 139 mmol/L (ref 135–145)
Total Bilirubin: 0.3 mg/dL (ref 0.3–1.2)
Total Protein: 7.2 g/dL (ref 6.5–8.1)

## 2019-03-01 MED ORDER — FAMOTIDINE IN NACL 20-0.9 MG/50ML-% IV SOLN
20.0000 mg | Freq: Once | INTRAVENOUS | Status: AC
  Administered 2019-03-01: 20 mg via INTRAVENOUS

## 2019-03-01 MED ORDER — HEPARIN SOD (PORK) LOCK FLUSH 100 UNIT/ML IV SOLN
500.0000 [IU] | Freq: Once | INTRAVENOUS | Status: AC | PRN
  Administered 2019-03-01: 500 [IU]
  Filled 2019-03-01: qty 5

## 2019-03-01 MED ORDER — SODIUM CHLORIDE 0.9 % IV SOLN
80.0000 mg/m2 | Freq: Once | INTRAVENOUS | Status: AC
  Administered 2019-03-01: 126 mg via INTRAVENOUS
  Filled 2019-03-01: qty 21

## 2019-03-01 MED ORDER — SODIUM CHLORIDE 0.9 % IV SOLN
Freq: Once | INTRAVENOUS | Status: AC
  Administered 2019-03-01: 11:00:00 via INTRAVENOUS
  Filled 2019-03-01: qty 250

## 2019-03-01 MED ORDER — SODIUM CHLORIDE 0.9% FLUSH
10.0000 mL | INTRAVENOUS | Status: DC | PRN
  Administered 2019-03-01: 10 mL
  Filled 2019-03-01: qty 10

## 2019-03-01 MED ORDER — FAMOTIDINE IN NACL 20-0.9 MG/50ML-% IV SOLN
INTRAVENOUS | Status: AC
  Filled 2019-03-01: qty 50

## 2019-03-01 MED ORDER — DIPHENHYDRAMINE HCL 50 MG/ML IJ SOLN
25.0000 mg | Freq: Once | INTRAMUSCULAR | Status: AC
  Administered 2019-03-01: 12:00:00 25 mg via INTRAVENOUS

## 2019-03-01 MED ORDER — SODIUM CHLORIDE 0.9 % IV SOLN
20.0000 mg | Freq: Once | INTRAVENOUS | Status: AC
  Administered 2019-03-01: 12:00:00 20 mg via INTRAVENOUS
  Filled 2019-03-01: qty 20

## 2019-03-01 MED ORDER — DIPHENHYDRAMINE HCL 50 MG/ML IJ SOLN
INTRAMUSCULAR | Status: AC
  Filled 2019-03-01: qty 1

## 2019-03-01 NOTE — Progress Notes (Signed)
Per Dr. Lindi Adie okay to treat with ALT 85 and AST 63

## 2019-03-01 NOTE — Telephone Encounter (Signed)
Closing today.  No return call received by Triage.  See [facility] 02-27-2019 "Telephone Encounter" note.

## 2019-03-01 NOTE — Assessment & Plan Note (Signed)
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 Adjuvant radiation therapy because of the tumor size being large 3. 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 3.Fatigue as expected from chemo. 4.Loss of taste 5.Hair loss  Return to clinic weekly for Taxol and every other week for follow-up with me.

## 2019-03-01 NOTE — Patient Instructions (Signed)
[facility] Discharge Instructions for Patients Receiving Chemotherapy  Today you received the following chemotherapy agents Paclitaxel (TAXOL).  To help prevent nausea and vomiting after your treatment, we encourage you to take your nausea medication as prescribed.  If you develop nausea and vomiting that is not controlled by your nausea medication, call the clinic.   BELOW ARE SYMPTOMS THAT SHOULD BE REPORTED IMMEDIATELY:  *FEVER GREATER THAN 100.5 F  *CHILLS WITH OR WITHOUT FEVER  NAUSEA AND VOMITING THAT IS NOT CONTROLLED WITH YOUR NAUSEA MEDICATION  *UNUSUAL SHORTNESS OF BREATH  *UNUSUAL BRUISING OR BLEEDING  TENDERNESS IN MOUTH AND THROAT WITH OR WITHOUT PRESENCE OF ULCERS  *URINARY PROBLEMS  *BOWEL PROBLEMS  UNUSUAL RASH Items with * indicate a potential emergency and should be followed up as soon as possible.  Feel free to call the clinic should you have any questions or concerns. The clinic phone number is [phone].  Please show the CHEMO ALERT CARD at check-in to the Emergency Department and triage nurse.  Coronavirus (COVID-19) Are you at risk?  Are you at risk for the Coronavirus (COVID-19)?  To be considered HIGH RISK for Coronavirus (COVID-19), you have to meet the following criteria:  . Traveled to [place], [place], [place], [place] or [place]; or in the [place] to [place], [place], [place], or [place]; and have fever, cough, and shortness of breath within the last 2 weeks of travel OR . Been in close contact with a person diagnosed with COVID-19 within the last 2 weeks and have fever, cough, and shortness of breath . IF YOU DO NOT MEET THESE CRITERIA, YOU ARE CONSIDERED LOW RISK FOR COVID-19.  What to do if you are HIGH RISK for COVID-19?  . If you are having a medical emergency, call 911. . Seek medical care right away. Before you go to a doctor's office, urgent care or emergency department, call ahead and tell them about  your recent travel, contact with someone diagnosed with COVID-19, and your symptoms. You should receive instructions from your physician's office regarding next steps of care.  . When you arrive at healthcare provider, tell the healthcare staff immediately you have returned from visiting [place], [place], [place], [place] or [place]; or traveled in the [place] to [place], [place], [place], or [place]; in the last two weeks or you have been in close contact with a person diagnosed with COVID-19 in the last 2 weeks.   . Tell the health care staff about your symptoms: fever, cough and shortness of breath. . After you have been seen by a medical provider, you will be either: o Tested for (COVID-19) and discharged home on quarantine except to seek medical care if symptoms worsen, and asked to  - Stay home and avoid contact with others until you get your results (4-5 days)  - Avoid travel on public transportation if possible (such as bus, train, or airplane) or o Sent to the Emergency Department by EMS for evaluation, COVID-19 testing, and possible admission depending on your condition and test results.  What to do if you are LOW RISK for COVID-19?  Reduce your risk of any infection by using the same precautions used for avoiding the common cold or flu:  . Wash your hands often with soap and warm water for at least 20 seconds.  If soap and water are not readily available, use an alcohol-based hand sanitizer with at least 60% alcohol.  . If coughing or sneezing,   cover your mouth and nose by coughing or sneezing into the elbow areas of your shirt or coat, into a tissue or into your sleeve (not your hands). . Avoid shaking hands with others and consider head nods or verbal greetings only. . Avoid touching your eyes, nose, or mouth with unwashed hands.  . Avoid close contact with people who are sick. . Avoid places or events with large numbers of people in one location, like concerts or sporting  events. . Carefully consider travel plans you have or are making. . If you are planning any travel outside or inside the [place], visit the CDC's Travelers' Health webpage for the latest health notices. . If you have some symptoms but not all symptoms, continue to monitor at home and seek medical attention if your symptoms worsen. . If you are having a medical emergency, call 911.   ADDITIONAL HEALTHCARE OPTIONS FOR PATIENTS  [facility] / e-Visit: [email or web address]         [facility]: [phone]  [facility]: [phone]                   [facility]: [phone]     

## 2019-03-09 ENCOUNTER — Other Ambulatory Visit: Payer: Self-pay

## 2019-03-09 ENCOUNTER — Inpatient Hospital Stay: Payer: BC Managed Care – PPO

## 2019-03-09 ENCOUNTER — Inpatient Hospital Stay: Payer: BC Managed Care – PPO | Attending: Hematology and Oncology

## 2019-03-09 VITALS — BP 98/71 | HR 84 | Temp 98.7°F | Resp 18 | Wt 114.2 lb

## 2019-03-09 DIAGNOSIS — R5383 Other fatigue: Secondary | ICD-10-CM | POA: Diagnosis not present

## 2019-03-09 DIAGNOSIS — Z79899 Other long term (current) drug therapy: Secondary | ICD-10-CM | POA: Insufficient documentation

## 2019-03-09 DIAGNOSIS — C50011 Malignant neoplasm of nipple and areola, right female breast: Secondary | ICD-10-CM

## 2019-03-09 DIAGNOSIS — M791 Myalgia, unspecified site: Secondary | ICD-10-CM | POA: Diagnosis not present

## 2019-03-09 DIAGNOSIS — R531 Weakness: Secondary | ICD-10-CM | POA: Diagnosis not present

## 2019-03-09 DIAGNOSIS — R51 Headache: Secondary | ICD-10-CM | POA: Insufficient documentation

## 2019-03-09 DIAGNOSIS — Z923 Personal history of irradiation: Secondary | ICD-10-CM | POA: Insufficient documentation

## 2019-03-09 DIAGNOSIS — Z79810 Long term (current) use of selective estrogen receptor modulators (SERMs): Secondary | ICD-10-CM | POA: Diagnosis not present

## 2019-03-09 DIAGNOSIS — D6481 Anemia due to antineoplastic chemotherapy: Secondary | ICD-10-CM | POA: Diagnosis not present

## 2019-03-09 DIAGNOSIS — Z9011 Acquired absence of right breast and nipple: Secondary | ICD-10-CM | POA: Insufficient documentation

## 2019-03-09 DIAGNOSIS — T451X5A Adverse effect of antineoplastic and immunosuppressive drugs, initial encounter: Secondary | ICD-10-CM | POA: Insufficient documentation

## 2019-03-09 DIAGNOSIS — Z170 Estrogen receptor positive status [ER+]: Secondary | ICD-10-CM | POA: Diagnosis not present

## 2019-03-09 DIAGNOSIS — Z5112 Encounter for antineoplastic immunotherapy: Secondary | ICD-10-CM | POA: Diagnosis not present

## 2019-03-09 DIAGNOSIS — Z95828 Presence of other vascular implants and grafts: Secondary | ICD-10-CM

## 2019-03-09 DIAGNOSIS — Z5111 Encounter for antineoplastic chemotherapy: Secondary | ICD-10-CM | POA: Diagnosis not present

## 2019-03-09 LAB — CMP (CANCER CENTER ONLY)
ALT: 102 U/L — ABNORMAL HIGH (ref 0–44)
AST: 74 U/L — ABNORMAL HIGH (ref 15–41)
Albumin: 3.8 g/dL (ref 3.5–5.0)
Alkaline Phosphatase: 216 U/L — ABNORMAL HIGH (ref 38–126)
Anion gap: 10 (ref 5–15)
BUN: 9 mg/dL (ref 6–20)
CO2: 25 mmol/L (ref 22–32)
Calcium: 9.1 mg/dL (ref 8.9–10.3)
Chloride: 105 mmol/L (ref 98–111)
Creatinine: 0.67 mg/dL (ref 0.44–1.00)
GFR, Est AFR Am: >60 mL/min (ref >60)
GFR, Estimated: >60 mL/min (ref >60)
Glucose, Bld: 121 mg/dL — ABNORMAL HIGH (ref 70–99)
Potassium: 3.7 mmol/L (ref 3.5–5.1)
Sodium: 140 mmol/L (ref 135–145)
Total Bilirubin: 0.4 mg/dL (ref 0.3–1.2)
Total Protein: 7.4 g/dL (ref 6.5–8.1)

## 2019-03-09 LAB — CBC WITH DIFFERENTIAL (CANCER CENTER ONLY)
Abs Immature Granulocytes: 0.02 K/uL (ref 0.00–0.07)
Basophils Absolute: 0 K/uL (ref 0.0–0.1)
Basophils Relative: 0 %
Eosinophils Absolute: 0 K/uL (ref 0.0–0.5)
Eosinophils Relative: 1 %
HCT: 30.9 % — ABNORMAL LOW (ref 36.0–46.0)
Hemoglobin: 10 g/dL — ABNORMAL LOW (ref 12.0–15.0)
Immature Granulocytes: 0 %
Lymphocytes Relative: 10 %
Lymphs Abs: 0.5 K/uL — ABNORMAL LOW (ref 0.7–4.0)
MCH: 29 pg (ref 26.0–34.0)
MCHC: 32.4 g/dL (ref 30.0–36.0)
MCV: 89.6 fL (ref 80.0–100.0)
Monocytes Absolute: 0.6 K/uL (ref 0.1–1.0)
Monocytes Relative: 11 %
Neutro Abs: 3.7 K/uL (ref 1.7–7.7)
Neutrophils Relative %: 78 %
Platelet Count: 275 K/uL (ref 150–400)
RBC: 3.45 MIL/uL — ABNORMAL LOW (ref 3.87–5.11)
RDW: 18.2 % — ABNORMAL HIGH (ref 11.5–15.5)
WBC Count: 4.8 K/uL (ref 4.0–10.5)
nRBC: 0 % (ref 0.0–0.2)

## 2019-03-09 MED ORDER — SODIUM CHLORIDE 0.9% FLUSH
10.0000 mL | INTRAVENOUS | Status: DC | PRN
  Administered 2019-03-09: 08:00:00 10 mL
  Filled 2019-03-09: qty 10

## 2019-03-09 MED ORDER — SODIUM CHLORIDE 0.9 % IV SOLN
60.0000 mg/m2 | Freq: Once | INTRAVENOUS | Status: AC
  Administered 2019-03-09: 96 mg via INTRAVENOUS
  Filled 2019-03-09: qty 16

## 2019-03-09 MED ORDER — SODIUM CHLORIDE 0.9 % IV SOLN
Freq: Once | INTRAVENOUS | Status: AC
  Administered 2019-03-09: 09:00:00 via INTRAVENOUS
  Filled 2019-03-09: qty 250

## 2019-03-09 MED ORDER — HEPARIN SOD (PORK) LOCK FLUSH 100 UNIT/ML IV SOLN
500.0000 [IU] | Freq: Once | INTRAVENOUS | Status: AC | PRN
  Administered 2019-03-09: 500 [IU]
  Filled 2019-03-09: qty 5

## 2019-03-09 MED ORDER — FAMOTIDINE IN NACL 20-0.9 MG/50ML-% IV SOLN
20.0000 mg | Freq: Once | INTRAVENOUS | Status: AC
  Administered 2019-03-09: 20 mg via INTRAVENOUS

## 2019-03-09 MED ORDER — FAMOTIDINE IN NACL 20-0.9 MG/50ML-% IV SOLN
INTRAVENOUS | Status: AC
  Filled 2019-03-09: qty 50

## 2019-03-09 MED ORDER — SODIUM CHLORIDE 0.9 % IV SOLN
20.0000 mg | Freq: Once | INTRAVENOUS | Status: AC
  Administered 2019-03-09: 10:00:00 20 mg via INTRAVENOUS
  Filled 2019-03-09: qty 2

## 2019-03-09 MED ORDER — DEXAMETHASONE SODIUM PHOSPHATE 10 MG/ML IJ SOLN
INTRAMUSCULAR | Status: AC
  Filled 2019-03-09: qty 1

## 2019-03-09 MED ORDER — DIPHENHYDRAMINE HCL 50 MG/ML IJ SOLN
INTRAMUSCULAR | Status: AC
  Filled 2019-03-09: qty 1

## 2019-03-09 MED ORDER — DIPHENHYDRAMINE HCL 50 MG/ML IJ SOLN
25.0000 mg | Freq: Once | INTRAMUSCULAR | Status: AC
  Administered 2019-03-09: 25 mg via INTRAVENOUS

## 2019-03-09 MED ORDER — SODIUM CHLORIDE 0.9% FLUSH
10.0000 mL | INTRAVENOUS | Status: DC | PRN
  Administered 2019-03-09: 10 mL
  Filled 2019-03-09: qty 10

## 2019-03-09 NOTE — Progress Notes (Signed)
Per Dr. Lindi Adie ok to treat with reduced taxol dosing.

## 2019-03-09 NOTE — Addendum Note (Signed)
Addended by: Nicholas Lose on: 03/09/2019 09:31 AM   Modules accepted: Orders

## 2019-03-09 NOTE — Patient Instructions (Signed)
[facility] Discharge Instructions for Patients Receiving Chemotherapy  Today you received the following chemotherapy agents Paclitaxel (TAXOL).  To help prevent nausea and vomiting after your treatment, we encourage you to take your nausea medication as prescribed.  If you develop nausea and vomiting that is not controlled by your nausea medication, call the clinic.   BELOW ARE SYMPTOMS THAT SHOULD BE REPORTED IMMEDIATELY:  *FEVER GREATER THAN 100.5 F  *CHILLS WITH OR WITHOUT FEVER  NAUSEA AND VOMITING THAT IS NOT CONTROLLED WITH YOUR NAUSEA MEDICATION  *UNUSUAL SHORTNESS OF BREATH  *UNUSUAL BRUISING OR BLEEDING  TENDERNESS IN MOUTH AND THROAT WITH OR WITHOUT PRESENCE OF ULCERS  *URINARY PROBLEMS  *BOWEL PROBLEMS  UNUSUAL RASH Items with * indicate a potential emergency and should be followed up as soon as possible.  Feel free to call the clinic should you have any questions or concerns. The clinic phone number is [phone].  Please show the CHEMO ALERT CARD at check-in to the Emergency Department and triage nurse.  Coronavirus (COVID-19) Are you at risk?  Are you at risk for the Coronavirus (COVID-19)?  To be considered HIGH RISK for Coronavirus (COVID-19), you have to meet the following criteria:  . Traveled to [place], [place], [place], [place] or [place]; or in the [place] to [place], [place], [place], or [place]; and have fever, cough, and shortness of breath within the last 2 weeks of travel OR . Been in close contact with a person diagnosed with COVID-19 within the last 2 weeks and have fever, cough, and shortness of breath . IF YOU DO NOT MEET THESE CRITERIA, YOU ARE CONSIDERED LOW RISK FOR COVID-19.  What to do if you are HIGH RISK for COVID-19?  . If you are having a medical emergency, call 911. . Seek medical care right away. Before you go to a doctor's office, urgent care or emergency department, call ahead and tell them about  your recent travel, contact with someone diagnosed with COVID-19, and your symptoms. You should receive instructions from your physician's office regarding next steps of care.  . When you arrive at healthcare provider, tell the healthcare staff immediately you have returned from visiting [place], [place], [place], [place] or [place]; or traveled in the [place] to [place], [place], [place], or [place]; in the last two weeks or you have been in close contact with a person diagnosed with COVID-19 in the last 2 weeks.   . Tell the health care staff about your symptoms: fever, cough and shortness of breath. . After you have been seen by a medical provider, you will be either: o Tested for (COVID-19) and discharged home on quarantine except to seek medical care if symptoms worsen, and asked to  - Stay home and avoid contact with others until you get your results (4-5 days)  - Avoid travel on public transportation if possible (such as bus, train, or airplane) or o Sent to the Emergency Department by EMS for evaluation, COVID-19 testing, and possible admission depending on your condition and test results.  What to do if you are LOW RISK for COVID-19?  Reduce your risk of any infection by using the same precautions used for avoiding the common cold or flu:  . Wash your hands often with soap and warm water for at least 20 seconds.  If soap and water are not readily available, use an alcohol-based hand sanitizer with at least 60% alcohol.  . If coughing or sneezing,   cover your mouth and nose by coughing or sneezing into the elbow areas of your shirt or coat, into a tissue or into your sleeve (not your hands). . Avoid shaking hands with others and consider head nods or verbal greetings only. . Avoid touching your eyes, nose, or mouth with unwashed hands.  . Avoid close contact with people who are sick. . Avoid places or events with large numbers of people in one location, like concerts or sporting  events. . Carefully consider travel plans you have or are making. . If you are planning any travel outside or inside the [place], visit the CDC's Travelers' Health webpage for the latest health notices. . If you have some symptoms but not all symptoms, continue to monitor at home and seek medical attention if your symptoms worsen. . If you are having a medical emergency, call 911.   ADDITIONAL HEALTHCARE OPTIONS FOR PATIENTS  [facility] / e-Visit: [email or web address]         [facility]: [phone]  [facility]: [phone]                   [facility]: [phone]     

## 2019-03-09 NOTE — Progress Notes (Signed)
Per Dr. Lindi Adie OK to treat with elevated AST and ALT, however he will do a dose reduction.  Infusion nurse notified.

## 2019-03-09 NOTE — Patient Instructions (Signed)

## 2019-03-13 NOTE — Assessment & Plan Note (Signed)
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 Adjuvant radiation therapy because of the tumor size being large 3. 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 3.Fatigue as expected from chemo. 4. Muscle aches and pains 5.  Benadryl related jitteriness: I will decrease the dosage of Benadryl.  Return to clinic weekly for Taxol and every other week for follow-up with me.

## 2019-03-15 NOTE — Progress Notes (Signed)
Patient Care Team: Newton Pigg, MD as PCP - General (Obstetrics and Gynecology)  DIAGNOSIS:    ICD-10-CM   1. 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  The Hereditary Gene Panel offered by [facility] includes sequencing and/or deletion duplication testing of the following 47 genes: APC, ATM, AXIN2, BARD1, BMPR1A, BRCA1, BRCA2, BRIP1, CDH1, CDK4, CDKN2A (p14ARF), CDKN2A (p16INK4a), CHEK2, CTNNA1, DICER1, EPCAM (Deletion/duplication testing only), GREM1 (promoter region deletion/duplication testing only), KIT, MEN1, MLH1, MSH2, MSH3, MSH6, MUTYH, NBN, NF1, NHTL1, PALB2, PDGFRA, PMS2, POLD1, POLE, PTEN, RAD50, RAD51C, RAD51D, SDHB, SDHC, SDHD, SMAD4, SMARCA4. STK11, TP53, TSC1, TSC2, and VHL.  The following genes were evaluated for sequence changes only: SDHA and HOXB13 c.251G>A variant only. The report date is December 08, 2018.    12/29/2018 -  Chemotherapy    Adjuvant chemotherapy with dose dense Adriamycin and Cytoxan followed by Taxol      CHIEF COMPLIANT: Cycle 4 Taxol  INTERVAL HISTORY: Ariana Luna is a 43 y.o. with above-mentioned history  of right breast cancer treated with right mastectomy in [place], and whocompleted 4 cycles ofadjuvant chemotherapy withdose denseAdriamycinand Cytoxanandpresents to the clinic todayfor cycle 4 of weekly Taxol.  REVIEW OF SYSTEMS:   Constitutional: Denies fevers, chills or abnormal weight loss Eyes: Denies blurriness of vision Ears, nose, mouth, throat, and face: Denies mucositis or sore throat Respiratory: Denies cough, dyspnea or wheezes Cardiovascular: Denies palpitation, chest discomfort Gastrointestinal: Denies nausea, heartburn or change in bowel habits Skin: Denies abnormal skin rashes Lymphatics: Denies new lymphadenopathy or easy bruising Neurological: Denies numbness, tingling or new weaknesses Behavioral/Psych: Mood is stable, no new changes  Extremities: No lower extremity edema Breast: denies any pain or lumps or nodules in either breasts All other systems were reviewed with the patient and are negative.  I have reviewed the past medical history, past surgical history, social history and family history with the patient and they are unchanged from previous note.  ALLERGIES:  has No Known Allergies.  MEDICATIONS:  Current Outpatient Medications  Medication Sig Dispense Refill  . lidocaine-prilocaine (EMLA) cream Apply 1 application topically as needed. 30 g 0  . lidocaine-prilocaine (EMLA) cream Apply to affected area once 30 g 3  . LORazepam (ATIVAN) 0.5 MG tablet Take 1 tablet (0.5 mg total) by mouth at bedtime as needed for sleep. 30 tablet 0  . ondansetron (ZOFRAN) 8 MG tablet Take 1 tablet (8 mg total) by mouth 2 (two) times daily as needed. Start on the third day after chemotherapy. 30 tablet 1  . prochlorperazine (COMPAZINE) 10 MG tablet Take 1 tablet (10 mg total) by  mouth every 6 (six) hours as needed (Nausea or vomiting). 30 tablet 1   No current facility-administered medications for this visit.     PHYSICAL EXAMINATION: ECOG PERFORMANCE STATUS: 1 -  Symptomatic but completely ambulatory  Vitals:   03/16/19 0915  BP: 93/66  Pulse: 60  Resp: 18  Temp: 99.1 F (37.3 C)  SpO2: 100%   Filed Weights   03/16/19 0915  Weight: 113 lb 9.6 oz (51.5 kg)    GENERAL: alert, no distress and comfortable SKIN: skin color, texture, turgor are normal, no rashes or significant lesions EYES: normal, Conjunctiva are pink and non-injected, sclera clear OROPHARYNX: no exudate, no erythema and lips, buccal mucosa, and tongue normal  NECK: supple, thyroid normal size, non-tender, without nodularity LYMPH: no palpable lymphadenopathy in the cervical, axillary or inguinal LUNGS: clear to auscultation and percussion with normal breathing effort HEART: regular rate & rhythm and no murmurs and no lower extremity edema ABDOMEN: abdomen soft, non-tender and normal bowel sounds MUSCULOSKELETAL: no cyanosis of digits and no clubbing  NEURO: alert & oriented x 3 with fluent speech, no focal motor/sensory deficits EXTREMITIES: No lower extremity edema  LABORATORY DATA:  I have reviewed the data as listed CMP Latest Ref Rng & Units 03/16/2019 03/09/2019 03/01/2019  Glucose 70 - 99 mg/dL 106(H) 121(H) 102(H)  BUN 6 - 20 mg/dL _0 Creatinine 0.44 - 1.00 mg/dL 0.63 0.67 0.64  Sodium 135 - 145 mmol/L 139 140 139  Potassium 3.5 - 5.1 mmol/L 3.8 3.7 3.6  Chloride 98 - 111 mmol/L 104 105 106  CO2 22 - 32 mmol/L _1 Calcium 8.9 - 10.3 mg/dL 9.5 9.1 8.7(L)  Total Protein 6.5 - 8.1 g/dL 7.7 7.4 7.2  Total Bilirubin 0.3 - 1.2 mg/dL 0.4 0.4 0.3  Alkaline Phos 38 - 126 U/L 283(H) 216(H) 143(H)  AST 15 - 41 U/L 43(H) 74(H) 63(H)  ALT 0 - 44 U/L 72(H) 102(H) 85(H)    Lab Results  Component Value Date   WBC 6.0 03/16/2019   HGB 10.4 (L) 03/16/2019   HCT 32.5 (L) 03/16/2019   MCV 90.5 03/16/2019   PLT 230 03/16/2019   NEUTROABS 4.8 03/16/2019    ASSESSMENT & PLAN:  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, estrogen  receptor positive ([place]) 09/30/2018:Right mastectomy with axillary lymph node dissection in [place]: Grade 2 IDC, 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 Adjuvant radiation therapy because of the tumor size being large 3. 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 3.Fatigue as expected from chemo. 4. Muscle aches and pains 5.  Benadryl related jitteriness: I will decrease the dosage of Benadryl. 6.  Chemotherapy-induced anemia: Hemoglobin today is 10.4.  We reduced the dosage of Taxol. Return to clinic weekly for Taxol and every other week for follow-up with me.  No orders of the defined types were placed in this encounter.  The patient has a good understanding of the overall plan. she agrees with it. she will call with any problems  that may develop before the next visit here.  Nicholas Lose, MD 03/16/2019  Julious Oka Dorshimer am acting as scribe for Dr. Nicholas Lose.  I have reviewed the above documentation for accuracy and completeness, and I agree with the above.

## 2019-03-16 ENCOUNTER — Inpatient Hospital Stay: Payer: BC Managed Care – PPO

## 2019-03-16 ENCOUNTER — Inpatient Hospital Stay (HOSPITAL_BASED_OUTPATIENT_CLINIC_OR_DEPARTMENT_OTHER): Payer: BC Managed Care – PPO | Admitting: Hematology and Oncology

## 2019-03-16 ENCOUNTER — Other Ambulatory Visit: Payer: Self-pay

## 2019-03-16 DIAGNOSIS — Z79810 Long term (current) use of selective estrogen receptor modulators (SERMs): Secondary | ICD-10-CM | POA: Diagnosis not present

## 2019-03-16 DIAGNOSIS — D6481 Anemia due to antineoplastic chemotherapy: Secondary | ICD-10-CM

## 2019-03-16 DIAGNOSIS — C50011 Malignant neoplasm of nipple and areola, right female breast: Secondary | ICD-10-CM | POA: Diagnosis not present

## 2019-03-16 DIAGNOSIS — Z5112 Encounter for antineoplastic immunotherapy: Secondary | ICD-10-CM | POA: Diagnosis not present

## 2019-03-16 DIAGNOSIS — Z170 Estrogen receptor positive status [ER+]: Secondary | ICD-10-CM | POA: Diagnosis not present

## 2019-03-16 DIAGNOSIS — Z79899 Other long term (current) drug therapy: Secondary | ICD-10-CM

## 2019-03-16 DIAGNOSIS — R531 Weakness: Secondary | ICD-10-CM

## 2019-03-16 DIAGNOSIS — T451X5A Adverse effect of antineoplastic and immunosuppressive drugs, initial encounter: Secondary | ICD-10-CM

## 2019-03-16 DIAGNOSIS — R51 Headache: Secondary | ICD-10-CM

## 2019-03-16 DIAGNOSIS — Z923 Personal history of irradiation: Secondary | ICD-10-CM

## 2019-03-16 DIAGNOSIS — Z95828 Presence of other vascular implants and grafts: Secondary | ICD-10-CM

## 2019-03-16 DIAGNOSIS — Z9011 Acquired absence of right breast and nipple: Secondary | ICD-10-CM

## 2019-03-16 LAB — CBC WITH DIFFERENTIAL (CANCER CENTER ONLY)
Abs Immature Granulocytes: 0.03 10*3/uL (ref 0.00–0.07)
Basophils Absolute: 0.1 10*3/uL (ref 0.0–0.1)
Basophils Relative: 1 %
Eosinophils Absolute: 0.1 10*3/uL (ref 0.0–0.5)
Eosinophils Relative: 1 %
HCT: 32.5 % — ABNORMAL LOW (ref 36.0–46.0)
Hemoglobin: 10.4 g/dL — ABNORMAL LOW (ref 12.0–15.0)
Immature Granulocytes: 1 %
Lymphocytes Relative: 8 %
Lymphs Abs: 0.5 10*3/uL — ABNORMAL LOW (ref 0.7–4.0)
MCH: 29 pg (ref 26.0–34.0)
MCHC: 32 g/dL (ref 30.0–36.0)
MCV: 90.5 fL (ref 80.0–100.0)
Monocytes Absolute: 0.6 10*3/uL (ref 0.1–1.0)
Monocytes Relative: 10 %
Neutro Abs: 4.8 10*3/uL (ref 1.7–7.7)
Neutrophils Relative %: 79 %
Platelet Count: 230 10*3/uL (ref 150–400)
RBC: 3.59 MIL/uL — ABNORMAL LOW (ref 3.87–5.11)
RDW: 17.6 % — ABNORMAL HIGH (ref 11.5–15.5)
WBC Count: 6 10*3/uL (ref 4.0–10.5)
nRBC: 0 % (ref 0.0–0.2)

## 2019-03-16 LAB — CMP (CANCER CENTER ONLY)
ALT: 72 U/L — ABNORMAL HIGH (ref 0–44)
AST: 43 U/L — ABNORMAL HIGH (ref 15–41)
Albumin: 3.7 g/dL (ref 3.5–5.0)
Alkaline Phosphatase: 283 U/L — ABNORMAL HIGH (ref 38–126)
Anion gap: 10 (ref 5–15)
BUN: 10 mg/dL (ref 6–20)
CO2: 25 mmol/L (ref 22–32)
Calcium: 9.5 mg/dL (ref 8.9–10.3)
Chloride: 104 mmol/L (ref 98–111)
Creatinine: 0.63 mg/dL (ref 0.44–1.00)
GFR, Est AFR Am: >60 mL/min (ref >60)
GFR, Estimated: >60 mL/min (ref >60)
Glucose, Bld: 106 mg/dL — ABNORMAL HIGH (ref 70–99)
Potassium: 3.8 mmol/L (ref 3.5–5.1)
Sodium: 139 mmol/L (ref 135–145)
Total Bilirubin: 0.4 mg/dL (ref 0.3–1.2)
Total Protein: 7.7 g/dL (ref 6.5–8.1)

## 2019-03-16 MED ORDER — FAMOTIDINE IN NACL 20-0.9 MG/50ML-% IV SOLN
INTRAVENOUS | Status: AC
  Filled 2019-03-16: qty 50

## 2019-03-16 MED ORDER — DIPHENHYDRAMINE HCL 50 MG/ML IJ SOLN
INTRAMUSCULAR | Status: AC
  Filled 2019-03-16: qty 1

## 2019-03-16 MED ORDER — SODIUM CHLORIDE 0.9 % IV SOLN
Freq: Once | INTRAVENOUS | Status: AC
  Administered 2019-03-16: 10:00:00 via INTRAVENOUS
  Filled 2019-03-16: qty 250

## 2019-03-16 MED ORDER — SODIUM CHLORIDE 0.9 % IV SOLN
20.0000 mg | Freq: Once | INTRAVENOUS | Status: AC
  Administered 2019-03-16: 11:00:00 20 mg via INTRAVENOUS
  Filled 2019-03-16: qty 20

## 2019-03-16 MED ORDER — SODIUM CHLORIDE 0.9% FLUSH
10.0000 mL | INTRAVENOUS | Status: DC | PRN
  Administered 2019-03-16: 10 mL
  Filled 2019-03-16: qty 10

## 2019-03-16 MED ORDER — DIPHENHYDRAMINE HCL 50 MG/ML IJ SOLN
25.0000 mg | Freq: Once | INTRAMUSCULAR | Status: AC
  Administered 2019-03-16: 10:00:00 25 mg via INTRAVENOUS

## 2019-03-16 MED ORDER — SODIUM CHLORIDE 0.9 % IV SOLN
60.0000 mg/m2 | Freq: Once | INTRAVENOUS | Status: AC
  Administered 2019-03-16: 11:00:00 96 mg via INTRAVENOUS
  Filled 2019-03-16: qty 16

## 2019-03-16 MED ORDER — FAMOTIDINE IN NACL 20-0.9 MG/50ML-% IV SOLN
20.0000 mg | Freq: Once | INTRAVENOUS | Status: AC
  Administered 2019-03-16: 10:00:00 20 mg via INTRAVENOUS

## 2019-03-16 MED ORDER — HEPARIN SOD (PORK) LOCK FLUSH 100 UNIT/ML IV SOLN
500.0000 [IU] | Freq: Once | INTRAVENOUS | Status: AC | PRN
  Administered 2019-03-16: 13:00:00 500 [IU]
  Filled 2019-03-16: qty 5

## 2019-03-16 NOTE — Patient Instructions (Signed)
[facility] Discharge Instructions for Patients Receiving Chemotherapy  Today you received the following chemotherapy agents Paclitaxel (TAXOL).  To help prevent nausea and vomiting after your treatment, we encourage you to take your nausea medication as prescribed.  If you develop nausea and vomiting that is not controlled by your nausea medication, call the clinic.   BELOW ARE SYMPTOMS THAT SHOULD BE REPORTED IMMEDIATELY:  *FEVER GREATER THAN 100.5 F  *CHILLS WITH OR WITHOUT FEVER  NAUSEA AND VOMITING THAT IS NOT CONTROLLED WITH YOUR NAUSEA MEDICATION  *UNUSUAL SHORTNESS OF BREATH  *UNUSUAL BRUISING OR BLEEDING  TENDERNESS IN MOUTH AND THROAT WITH OR WITHOUT PRESENCE OF ULCERS  *URINARY PROBLEMS  *BOWEL PROBLEMS  UNUSUAL RASH Items with * indicate a potential emergency and should be followed up as soon as possible.  Feel free to call the clinic should you have any questions or concerns. The clinic phone number is [phone].  Please show the CHEMO ALERT CARD at check-in to the Emergency Department and triage nurse.  Coronavirus (COVID-19) Are you at risk?  Are you at risk for the Coronavirus (COVID-19)?  To be considered HIGH RISK for Coronavirus (COVID-19), you have to meet the following criteria:  . Traveled to [place], [place], [place], [place] or [place]; or in the [place] to [place], [place], [place], or [place]; and have fever, cough, and shortness of breath within the last 2 weeks of travel OR . Been in close contact with a person diagnosed with COVID-19 within the last 2 weeks and have fever, cough, and shortness of breath . IF YOU DO NOT MEET THESE CRITERIA, YOU ARE CONSIDERED LOW RISK FOR COVID-19.  What to do if you are HIGH RISK for COVID-19?  . If you are having a medical emergency, call 911. . Seek medical care right away. Before you go to a doctor's office, urgent care or emergency department, call ahead and tell them about  your recent travel, contact with someone diagnosed with COVID-19, and your symptoms. You should receive instructions from your physician's office regarding next steps of care.  . When you arrive at healthcare provider, tell the healthcare staff immediately you have returned from visiting [place], [place], [place], [place] or [place]; or traveled in the [place] to [place], [place], [place], or [place]; in the last two weeks or you have been in close contact with a person diagnosed with COVID-19 in the last 2 weeks.   . Tell the health care staff about your symptoms: fever, cough and shortness of breath. . After you have been seen by a medical provider, you will be either: o Tested for (COVID-19) and discharged home on quarantine except to seek medical care if symptoms worsen, and asked to  - Stay home and avoid contact with others until you get your results (4-5 days)  - Avoid travel on public transportation if possible (such as bus, train, or airplane) or o Sent to the Emergency Department by EMS for evaluation, COVID-19 testing, and possible admission depending on your condition and test results.  What to do if you are LOW RISK for COVID-19?  Reduce your risk of any infection by using the same precautions used for avoiding the common cold or flu:  . Wash your hands often with soap and warm water for at least 20 seconds.  If soap and water are not readily available, use an alcohol-based hand sanitizer with at least 60% alcohol.  . If coughing or sneezing,   cover your mouth and nose by coughing or sneezing into the elbow areas of your shirt or coat, into a tissue or into your sleeve (not your hands). . Avoid shaking hands with others and consider head nods or verbal greetings only. . Avoid touching your eyes, nose, or mouth with unwashed hands.  . Avoid close contact with people who are sick. . Avoid places or events with large numbers of people in one location, like concerts or sporting  events. . Carefully consider travel plans you have or are making. . If you are planning any travel outside or inside the [place], visit the CDC's Travelers' Health webpage for the latest health notices. . If you have some symptoms but not all symptoms, continue to monitor at home and seek medical attention if your symptoms worsen. . If you are having a medical emergency, call 911.   ADDITIONAL HEALTHCARE OPTIONS FOR PATIENTS  [facility] / e-Visit: [email or web address]         [facility]: [phone]  [facility]: [phone]                   [facility]: [phone]     

## 2019-03-23 ENCOUNTER — Inpatient Hospital Stay: Payer: BC Managed Care – PPO

## 2019-03-23 ENCOUNTER — Other Ambulatory Visit: Payer: Self-pay

## 2019-03-23 VITALS — BP 98/71 | HR 98 | Temp 99.8°F | Resp 18

## 2019-03-23 DIAGNOSIS — Z95828 Presence of other vascular implants and grafts: Secondary | ICD-10-CM

## 2019-03-23 DIAGNOSIS — Z170 Estrogen receptor positive status [ER+]: Secondary | ICD-10-CM

## 2019-03-23 DIAGNOSIS — C50011 Malignant neoplasm of nipple and areola, right female breast: Secondary | ICD-10-CM

## 2019-03-23 LAB — CBC WITH DIFFERENTIAL (CANCER CENTER ONLY)
Abs Immature Granulocytes: 0.03 10*3/uL (ref 0.00–0.07)
Basophils Absolute: 0 10*3/uL (ref 0.0–0.1)
Basophils Relative: 1 %
Eosinophils Absolute: 0.2 10*3/uL (ref 0.0–0.5)
Eosinophils Relative: 3 %
HCT: 31.7 % — ABNORMAL LOW (ref 36.0–46.0)
Hemoglobin: 10.2 g/dL — ABNORMAL LOW (ref 12.0–15.0)
Immature Granulocytes: 1 %
Lymphocytes Relative: 9 %
Lymphs Abs: 0.5 10*3/uL — ABNORMAL LOW (ref 0.7–4.0)
MCH: 29.1 pg (ref 26.0–34.0)
MCHC: 32.2 g/dL (ref 30.0–36.0)
MCV: 90.3 fL (ref 80.0–100.0)
Monocytes Absolute: 0.6 10*3/uL (ref 0.1–1.0)
Monocytes Relative: 10 %
Neutro Abs: 4.3 10*3/uL (ref 1.7–7.7)
Neutrophils Relative %: 76 %
Platelet Count: 230 10*3/uL (ref 150–400)
RBC: 3.51 MIL/uL — ABNORMAL LOW (ref 3.87–5.11)
RDW: 16.6 % — ABNORMAL HIGH (ref 11.5–15.5)
WBC Count: 5.6 10*3/uL (ref 4.0–10.5)
nRBC: 0 % (ref 0.0–0.2)

## 2019-03-23 LAB — CMP (CANCER CENTER ONLY)
ALT: 44 U/L (ref 0–44)
AST: 33 U/L (ref 15–41)
Albumin: 3.7 g/dL (ref 3.5–5.0)
Alkaline Phosphatase: 287 U/L — ABNORMAL HIGH (ref 38–126)
Anion gap: 7 (ref 5–15)
BUN: 10 mg/dL (ref 6–20)
CO2: 27 mmol/L (ref 22–32)
Calcium: 9.4 mg/dL (ref 8.9–10.3)
Chloride: 102 mmol/L (ref 98–111)
Creatinine: 0.72 mg/dL (ref 0.44–1.00)
GFR, Est AFR Am: >60 mL/min (ref >60)
GFR, Estimated: >60 mL/min (ref >60)
Glucose, Bld: 120 mg/dL — ABNORMAL HIGH (ref 70–99)
Potassium: 3.7 mmol/L (ref 3.5–5.1)
Sodium: 136 mmol/L (ref 135–145)
Total Bilirubin: 0.5 mg/dL (ref 0.3–1.2)
Total Protein: 7.8 g/dL (ref 6.5–8.1)

## 2019-03-23 MED ORDER — SODIUM CHLORIDE 0.9 % IV SOLN
Freq: Once | INTRAVENOUS | Status: AC
  Administered 2019-03-23: 09:00:00 via INTRAVENOUS
  Filled 2019-03-23: qty 250

## 2019-03-23 MED ORDER — DIPHENHYDRAMINE HCL 50 MG/ML IJ SOLN
INTRAMUSCULAR | Status: AC
  Filled 2019-03-23: qty 1

## 2019-03-23 MED ORDER — DIPHENHYDRAMINE HCL 50 MG/ML IJ SOLN
25.0000 mg | Freq: Once | INTRAMUSCULAR | Status: AC
  Administered 2019-03-23: 25 mg via INTRAVENOUS

## 2019-03-23 MED ORDER — FAMOTIDINE IN NACL 20-0.9 MG/50ML-% IV SOLN
INTRAVENOUS | Status: AC
  Filled 2019-03-23: qty 50

## 2019-03-23 MED ORDER — SODIUM CHLORIDE 0.9% FLUSH
10.0000 mL | INTRAVENOUS | Status: DC | PRN
  Administered 2019-03-23: 10 mL
  Filled 2019-03-23: qty 10

## 2019-03-23 MED ORDER — HEPARIN SOD (PORK) LOCK FLUSH 100 UNIT/ML IV SOLN
500.0000 [IU] | Freq: Once | INTRAVENOUS | Status: AC | PRN
  Administered 2019-03-23: 500 [IU]
  Filled 2019-03-23: qty 5

## 2019-03-23 MED ORDER — FAMOTIDINE IN NACL 20-0.9 MG/50ML-% IV SOLN
20.0000 mg | Freq: Once | INTRAVENOUS | Status: AC
  Administered 2019-03-23: 20 mg via INTRAVENOUS

## 2019-03-23 MED ORDER — SODIUM CHLORIDE 0.9 % IV SOLN
60.0000 mg/m2 | Freq: Once | INTRAVENOUS | Status: AC
  Administered 2019-03-23: 96 mg via INTRAVENOUS
  Filled 2019-03-23: qty 16

## 2019-03-23 MED ORDER — SODIUM CHLORIDE 0.9 % IV SOLN
20.0000 mg | Freq: Once | INTRAVENOUS | Status: AC
  Administered 2019-03-23: 20 mg via INTRAVENOUS
  Filled 2019-03-23: qty 20

## 2019-03-23 NOTE — Patient Instructions (Signed)
[facility] Discharge Instructions for Patients Receiving Chemotherapy  Today you received the following chemotherapy agents Paclitaxel (TAXOL).  To help prevent nausea and vomiting after your treatment, we encourage you to take your nausea medication as prescribed.  If you develop nausea and vomiting that is not controlled by your nausea medication, call the clinic.   BELOW ARE SYMPTOMS THAT SHOULD BE REPORTED IMMEDIATELY:  *FEVER GREATER THAN 100.5 F  *CHILLS WITH OR WITHOUT FEVER  NAUSEA AND VOMITING THAT IS NOT CONTROLLED WITH YOUR NAUSEA MEDICATION  *UNUSUAL SHORTNESS OF BREATH  *UNUSUAL BRUISING OR BLEEDING  TENDERNESS IN MOUTH AND THROAT WITH OR WITHOUT PRESENCE OF ULCERS  *URINARY PROBLEMS  *BOWEL PROBLEMS  UNUSUAL RASH Items with * indicate a potential emergency and should be followed up as soon as possible.  Feel free to call the clinic should you have any questions or concerns. The clinic phone number is [phone].  Please show the CHEMO ALERT CARD at check-in to the Emergency Department and triage nurse.  Coronavirus (COVID-19) Are you at risk?  Are you at risk for the Coronavirus (COVID-19)?  To be considered HIGH RISK for Coronavirus (COVID-19), you have to meet the following criteria:  . Traveled to [place], [place], [place], [place] or [place]; or in the [place] to [place], [place], [place], or [place]; and have fever, cough, and shortness of breath within the last 2 weeks of travel OR . Been in close contact with a person diagnosed with COVID-19 within the last 2 weeks and have fever, cough, and shortness of breath . IF YOU DO NOT MEET THESE CRITERIA, YOU ARE CONSIDERED LOW RISK FOR COVID-19.  What to do if you are HIGH RISK for COVID-19?  . If you are having a medical emergency, call 911. . Seek medical care right away. Before you go to a doctor's office, urgent care or emergency department, call ahead and tell them about  your recent travel, contact with someone diagnosed with COVID-19, and your symptoms. You should receive instructions from your physician's office regarding next steps of care.  . When you arrive at healthcare provider, tell the healthcare staff immediately you have returned from visiting [place], [place], [place], [place] or [place]; or traveled in the [place] to [place], [place], [place], or [place]; in the last two weeks or you have been in close contact with a person diagnosed with COVID-19 in the last 2 weeks.   . Tell the health care staff about your symptoms: fever, cough and shortness of breath. . After you have been seen by a medical provider, you will be either: o Tested for (COVID-19) and discharged home on quarantine except to seek medical care if symptoms worsen, and asked to  - Stay home and avoid contact with others until you get your results (4-5 days)  - Avoid travel on public transportation if possible (such as bus, train, or airplane) or o Sent to the Emergency Department by EMS for evaluation, COVID-19 testing, and possible admission depending on your condition and test results.  What to do if you are LOW RISK for COVID-19?  Reduce your risk of any infection by using the same precautions used for avoiding the common cold or flu:  . Wash your hands often with soap and warm water for at least 20 seconds.  If soap and water are not readily available, use an alcohol-based hand sanitizer with at least 60% alcohol.  . If coughing or sneezing,   cover your mouth and nose by coughing or sneezing into the elbow areas of your shirt or coat, into a tissue or into your sleeve (not your hands). . Avoid shaking hands with others and consider head nods or verbal greetings only. . Avoid touching your eyes, nose, or mouth with unwashed hands.  . Avoid close contact with people who are sick. . Avoid places or events with large numbers of people in one location, like concerts or sporting  events. . Carefully consider travel plans you have or are making. . If you are planning any travel outside or inside the [place], visit the CDC's Travelers' Health webpage for the latest health notices. . If you have some symptoms but not all symptoms, continue to monitor at home and seek medical attention if your symptoms worsen. . If you are having a medical emergency, call 911.   ADDITIONAL HEALTHCARE OPTIONS FOR PATIENTS  [facility] / e-Visit: [email or web address]         [facility]: [phone]  [facility]: [phone]                   [facility]: [phone]     

## 2019-03-26 NOTE — Assessment & Plan Note (Signed)
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 Adjuvant radiation therapy because of the tumor size being large 3. 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 3.Fatigue as expected from chemo. 4. Muscle aches and pains 5.Benadryl related jitteriness: I will decrease the dosage of Benadryl. 6.  Chemotherapy-induced anemia: Hemoglobin today is 10.4.  We reduced the dosage of Taxol. Return to clinicweekly for Taxol and every other week for follow-up with me.

## 2019-03-29 NOTE — Progress Notes (Signed)
Patient Care Team: Newton Pigg, MD as PCP - General (Obstetrics and Gynecology)  DIAGNOSIS:    ICD-10-CM   1. 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  The Hereditary Gene Panel offered by [facility] includes sequencing and/or deletion duplication testing of the following 47 genes: APC, ATM, AXIN2, BARD1, BMPR1A, BRCA1, BRCA2, BRIP1, CDH1, CDK4, CDKN2A (p14ARF), CDKN2A (p16INK4a), CHEK2, CTNNA1, DICER1, EPCAM (Deletion/duplication testing only), GREM1 (promoter region deletion/duplication testing only), KIT, MEN1, MLH1, MSH2, MSH3, MSH6, MUTYH, NBN, NF1, NHTL1, PALB2, PDGFRA, PMS2, POLD1, POLE, PTEN, RAD50, RAD51C, RAD51D, SDHB, SDHC, SDHD, SMAD4, SMARCA4. STK11, TP53, TSC1, TSC2, and VHL.  The following genes were evaluated for sequence changes only: SDHA and HOXB13 c.251G>A variant only. The report date is December 08, 2018.    12/29/2018 -  Chemotherapy    Adjuvant chemotherapy with dose dense Adriamycin and Cytoxan followed by Taxol      CHIEF COMPLIANT: Cycle 6 Taxol  INTERVAL HISTORY: Ariana Luna is a 44 y.o. with above-mentioned history  of right breast cancer treated with right mastectomy in [place], and whocompleted 4 cycles ofadjuvant chemotherapy withdose denseAdriamycinand Cytoxan. Shepresents to the clinic todayfor cycle6 of weekly Taxol.  REVIEW OF SYSTEMS:   Constitutional: Denies fevers, chills or abnormal weight loss Eyes: Denies blurriness of vision Ears, nose, mouth, throat, and face: Denies mucositis or sore throat Respiratory: Denies cough, dyspnea or wheezes Cardiovascular: Denies palpitation, chest discomfort Gastrointestinal: Denies nausea, heartburn or change in bowel habits Skin: Denies abnormal skin rashes Lymphatics: Denies new lymphadenopathy or easy bruising Neurological: Denies numbness, tingling or new weaknesses Behavioral/Psych: Mood is stable, no new changes  Extremities: No lower extremity edema Breast: denies any pain or lumps or nodules in either breasts All other systems were reviewed with the patient and are negative.  I have reviewed the past medical history, past surgical history, social history and family history with the patient and they are unchanged from previous note.  ALLERGIES:  has No Known Allergies.  MEDICATIONS:  Current Outpatient Medications  Medication Sig Dispense Refill  . amoxicillin (AMOXIL) 500 MG capsule Take 1 capsule (500 mg total) by mouth 2 (two) times daily. 14 capsule 0  . lidocaine-prilocaine (EMLA) cream Apply 1 application topically as needed. 30 g 0  . lidocaine-prilocaine (EMLA) cream Apply to affected area once 30 g 3  . LORazepam (ATIVAN) 0.5 MG tablet Take 1 tablet (0.5 mg total) by mouth at bedtime as needed for sleep. 30 tablet 0  . ondansetron (ZOFRAN) 8 MG tablet Take 1 tablet (8 mg total) by mouth 2 (two) times daily as needed. Start on  the third day after chemotherapy. 30 tablet 1  . prochlorperazine (COMPAZINE) 10 MG tablet Take 1 tablet (10 mg total) by mouth every 6 (six) hours as needed (Nausea or vomiting). 30 tablet 1   No current  facility-administered medications for this visit.     PHYSICAL EXAMINATION: ECOG PERFORMANCE STATUS: 1 - Symptomatic but completely ambulatory  Vitals:   03/30/19 0918  BP: (!) 92/59  Pulse: 98  Resp: 18  Temp: (!) 93 F (33.9 C)  SpO2: 100%   Filed Weights   03/30/19 0918  Weight: 112 lb 14.4 oz (51.2 kg)    GENERAL: alert, no distress and comfortable SKIN: skin color, texture, turgor are normal, no rashes or significant lesions EYES: normal, Conjunctiva are pink and non-injected, sclera clear OROPHARYNX: no exudate, no erythema and lips, buccal mucosa, and tongue normal  NECK: supple, thyroid normal size, non-tender, without nodularity LYMPH: no palpable lymphadenopathy in the cervical, axillary or inguinal LUNGS: clear to auscultation and percussion with normal breathing effort HEART: regular rate & rhythm and no murmurs and no lower extremity edema ABDOMEN: abdomen soft, non-tender and normal bowel sounds MUSCULOSKELETAL: no cyanosis of digits and no clubbing  NEURO: alert & oriented x 3 with fluent speech, no focal motor/sensory deficits EXTREMITIES: No lower extremity edema  LABORATORY DATA:  I have reviewed the data as listed CMP Latest Ref Rng & Units 03/23/2019 03/16/2019 03/09/2019  Glucose 70 - 99 mg/dL 120(H) 106(H) 121(H)  BUN 6 - 20 mg/dL 10 10 9   Creatinine 0.44 - 1.00 mg/dL 0.72 0.63 0.67  Sodium 135 - 145 mmol/L 136 139 140  Potassium 3.5 - 5.1 mmol/L 3.7 3.8 3.7  Chloride 98 - 111 mmol/L 102 104 105  CO2 22 - 32 mmol/L 27 25 25   Calcium 8.9 - 10.3 mg/dL 9.4 9.5 9.1  Total Protein 6.5 - 8.1 g/dL 7.8 7.7 7.4  Total Bilirubin 0.3 - 1.2 mg/dL 0.5 0.4 0.4  Alkaline Phos 38 - 126 U/L 287(H) 283(H) 216(H)  AST 15 - 41 U/L 33 43(H) 74(H)  ALT 0 - 44 U/L 44 72(H) 102(H)    Lab Results  Component Value Date   WBC 5.7 03/30/2019   HGB 9.3 (L) 03/30/2019   HCT 29.5 (L) 03/30/2019   MCV 91.9 03/30/2019   PLT 256 03/30/2019   NEUTROABS 4.4 03/30/2019     ASSESSMENT & PLAN:  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 Adjuvant radiation therapy because of the tumor size being large 3. 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 4. Muscle aches and pains 5.Benadryl related jitteriness: I will decrease the dosage of Benadryl. 6.  Chemotherapy-induced anemia: Hemoglobin today is 9.3.  So far patient denies any signs or symptoms of neuropathy.  I instructed her to inform me if she starts noticing the symptoms. We reduced the dosage of Taxol and she  appears to be doing slightly better. Return to clinicweekly for Taxol and every other week for follow-up with me.    No orders of the defined types were placed in this encounter.  The patient has a good understanding of the overall plan. she agrees with it. she will call with any problems that may develop before the next visit here.  Nicholas Lose, MD 03/30/2019  Julious Oka Dorshimer am acting as scribe for Dr. Nicholas Lose.  I have reviewed the above documentation for accuracy and completeness, and I agree with the  above.

## 2019-03-30 ENCOUNTER — Inpatient Hospital Stay: Payer: BC Managed Care – PPO

## 2019-03-30 ENCOUNTER — Inpatient Hospital Stay (HOSPITAL_BASED_OUTPATIENT_CLINIC_OR_DEPARTMENT_OTHER): Payer: BC Managed Care – PPO | Admitting: Hematology and Oncology

## 2019-03-30 ENCOUNTER — Other Ambulatory Visit: Payer: Self-pay

## 2019-03-30 VITALS — Temp 98.4°F

## 2019-03-30 DIAGNOSIS — Z79810 Long term (current) use of selective estrogen receptor modulators (SERMs): Secondary | ICD-10-CM

## 2019-03-30 DIAGNOSIS — Z79899 Other long term (current) drug therapy: Secondary | ICD-10-CM

## 2019-03-30 DIAGNOSIS — Z95828 Presence of other vascular implants and grafts: Secondary | ICD-10-CM

## 2019-03-30 DIAGNOSIS — C50011 Malignant neoplasm of nipple and areola, right female breast: Secondary | ICD-10-CM | POA: Diagnosis not present

## 2019-03-30 DIAGNOSIS — Z170 Estrogen receptor positive status [ER+]: Secondary | ICD-10-CM

## 2019-03-30 DIAGNOSIS — T451X5A Adverse effect of antineoplastic and immunosuppressive drugs, initial encounter: Secondary | ICD-10-CM

## 2019-03-30 DIAGNOSIS — D6481 Anemia due to antineoplastic chemotherapy: Secondary | ICD-10-CM

## 2019-03-30 DIAGNOSIS — R531 Weakness: Secondary | ICD-10-CM

## 2019-03-30 DIAGNOSIS — Z5112 Encounter for antineoplastic immunotherapy: Secondary | ICD-10-CM

## 2019-03-30 DIAGNOSIS — R51 Headache: Secondary | ICD-10-CM

## 2019-03-30 DIAGNOSIS — Z5111 Encounter for antineoplastic chemotherapy: Secondary | ICD-10-CM | POA: Diagnosis not present

## 2019-03-30 DIAGNOSIS — R5383 Other fatigue: Secondary | ICD-10-CM

## 2019-03-30 DIAGNOSIS — Z923 Personal history of irradiation: Secondary | ICD-10-CM

## 2019-03-30 DIAGNOSIS — Z9011 Acquired absence of right breast and nipple: Secondary | ICD-10-CM

## 2019-03-30 DIAGNOSIS — M791 Myalgia, unspecified site: Secondary | ICD-10-CM

## 2019-03-30 LAB — CMP (CANCER CENTER ONLY)
ALT: 30 U/L (ref 0–44)
AST: 29 U/L (ref 15–41)
Albumin: 3.4 g/dL — ABNORMAL LOW (ref 3.5–5.0)
Alkaline Phosphatase: 232 U/L — ABNORMAL HIGH (ref 38–126)
Anion gap: 9 (ref 5–15)
BUN: 9 mg/dL (ref 6–20)
CO2: 26 mmol/L (ref 22–32)
Calcium: 9.1 mg/dL (ref 8.9–10.3)
Chloride: 103 mmol/L (ref 98–111)
Creatinine: 0.7 mg/dL (ref 0.44–1.00)
GFR, Est AFR Am: >60 mL/min (ref >60)
GFR, Estimated: >60 mL/min (ref >60)
Glucose, Bld: 119 mg/dL — ABNORMAL HIGH (ref 70–99)
Potassium: 3.6 mmol/L (ref 3.5–5.1)
Sodium: 138 mmol/L (ref 135–145)
Total Bilirubin: 0.3 mg/dL (ref 0.3–1.2)
Total Protein: 7.3 g/dL (ref 6.5–8.1)

## 2019-03-30 LAB — CBC WITH DIFFERENTIAL (CANCER CENTER ONLY)
Abs Immature Granulocytes: 0.03 10*3/uL (ref 0.00–0.07)
Basophils Absolute: 0 10*3/uL (ref 0.0–0.1)
Basophils Relative: 0 %
Eosinophils Absolute: 0.2 10*3/uL (ref 0.0–0.5)
Eosinophils Relative: 3 %
HCT: 29.5 % — ABNORMAL LOW (ref 36.0–46.0)
Hemoglobin: 9.3 g/dL — ABNORMAL LOW (ref 12.0–15.0)
Immature Granulocytes: 1 %
Lymphocytes Relative: 9 %
Lymphs Abs: 0.5 10*3/uL — ABNORMAL LOW (ref 0.7–4.0)
MCH: 29 pg (ref 26.0–34.0)
MCHC: 31.5 g/dL (ref 30.0–36.0)
MCV: 91.9 fL (ref 80.0–100.0)
Monocytes Absolute: 0.6 10*3/uL (ref 0.1–1.0)
Monocytes Relative: 10 %
Neutro Abs: 4.4 10*3/uL (ref 1.7–7.7)
Neutrophils Relative %: 77 %
Platelet Count: 256 10*3/uL (ref 150–400)
RBC: 3.21 MIL/uL — ABNORMAL LOW (ref 3.87–5.11)
RDW: 15.7 % — ABNORMAL HIGH (ref 11.5–15.5)
WBC Count: 5.7 10*3/uL (ref 4.0–10.5)
nRBC: 0 % (ref 0.0–0.2)

## 2019-03-30 MED ORDER — SODIUM CHLORIDE 0.9 % IV SOLN
20.0000 mg | Freq: Once | INTRAVENOUS | Status: AC
  Administered 2019-03-30: 20 mg via INTRAVENOUS
  Filled 2019-03-30: qty 20

## 2019-03-30 MED ORDER — HEPARIN SOD (PORK) LOCK FLUSH 100 UNIT/ML IV SOLN
500.0000 [IU] | Freq: Once | INTRAVENOUS | Status: DC | PRN
  Filled 2019-03-30: qty 5

## 2019-03-30 MED ORDER — AMOXICILLIN 500 MG PO CAPS: 500.0000 mg | ORAL_CAPSULE | Freq: Two times a day (BID) | ORAL | 0 refills | Status: DC

## 2019-03-30 MED ORDER — SODIUM CHLORIDE 0.9 % IV SOLN
60.0000 mg/m2 | Freq: Once | INTRAVENOUS | Status: AC
  Administered 2019-03-30: 12:00:00 96 mg via INTRAVENOUS
  Filled 2019-03-30: qty 16

## 2019-03-30 MED ORDER — DIPHENHYDRAMINE HCL 50 MG/ML IJ SOLN
25.0000 mg | Freq: Once | INTRAMUSCULAR | Status: AC
  Administered 2019-03-30: 25 mg via INTRAVENOUS

## 2019-03-30 MED ORDER — SODIUM CHLORIDE 0.9% FLUSH
10.0000 mL | INTRAVENOUS | Status: DC | PRN
  Filled 2019-03-30: qty 10

## 2019-03-30 MED ORDER — DIPHENHYDRAMINE HCL 50 MG/ML IJ SOLN
INTRAMUSCULAR | Status: AC
  Filled 2019-03-30: qty 1

## 2019-03-30 MED ORDER — DEXAMETHASONE SODIUM PHOSPHATE 10 MG/ML IJ SOLN
INTRAMUSCULAR | Status: AC
  Filled 2019-03-30: qty 2

## 2019-03-30 MED ORDER — SODIUM CHLORIDE 0.9% FLUSH
10.0000 mL | INTRAVENOUS | Status: DC | PRN
  Administered 2019-03-30: 10 mL
  Filled 2019-03-30: qty 10

## 2019-03-30 MED ORDER — SODIUM CHLORIDE 0.9 % IV SOLN
Freq: Once | INTRAVENOUS | Status: AC
  Administered 2019-03-30: 10:00:00 via INTRAVENOUS
  Filled 2019-03-30: qty 250

## 2019-03-30 MED ORDER — FAMOTIDINE IN NACL 20-0.9 MG/50ML-% IV SOLN
20.0000 mg | Freq: Once | INTRAVENOUS | Status: AC
  Administered 2019-03-30: 20 mg via INTRAVENOUS

## 2019-03-30 MED ORDER — FAMOTIDINE IN NACL 20-0.9 MG/50ML-% IV SOLN
INTRAVENOUS | Status: AC
  Filled 2019-03-30: qty 50

## 2019-03-30 NOTE — Patient Instructions (Signed)
[facility] Discharge Instructions for Patients Receiving Chemotherapy  Today you received the following chemotherapy agents Paclitaxel (TAXOL).  To help prevent nausea and vomiting after your treatment, we encourage you to take your nausea medication as prescribed.  If you develop nausea and vomiting that is not controlled by your nausea medication, call the clinic.   BELOW ARE SYMPTOMS THAT SHOULD BE REPORTED IMMEDIATELY:  *FEVER GREATER THAN 100.5 F  *CHILLS WITH OR WITHOUT FEVER  NAUSEA AND VOMITING THAT IS NOT CONTROLLED WITH YOUR NAUSEA MEDICATION  *UNUSUAL SHORTNESS OF BREATH  *UNUSUAL BRUISING OR BLEEDING  TENDERNESS IN MOUTH AND THROAT WITH OR WITHOUT PRESENCE OF ULCERS  *URINARY PROBLEMS  *BOWEL PROBLEMS  UNUSUAL RASH Items with * indicate a potential emergency and should be followed up as soon as possible.  Feel free to call the clinic should you have any questions or concerns. The clinic phone number is [phone].  Please show the CHEMO ALERT CARD at check-in to the Emergency Department and triage nurse.  Coronavirus (COVID-19) Are you at risk?  Are you at risk for the Coronavirus (COVID-19)?  To be considered HIGH RISK for Coronavirus (COVID-19), you have to meet the following criteria:  . Traveled to [place], [place], [place], [place] or [place]; or in the [place] to [place], [place], [place], or [place]; and have fever, cough, and shortness of breath within the last 2 weeks of travel OR . Been in close contact with a person diagnosed with COVID-19 within the last 2 weeks and have fever, cough, and shortness of breath . IF YOU DO NOT MEET THESE CRITERIA, YOU ARE CONSIDERED LOW RISK FOR COVID-19.  What to do if you are HIGH RISK for COVID-19?  . If you are having a medical emergency, call 911. . Seek medical care right away. Before you go to a doctor's office, urgent care or emergency department, call ahead and tell them about  your recent travel, contact with someone diagnosed with COVID-19, and your symptoms. You should receive instructions from your physician's office regarding next steps of care.  . When you arrive at healthcare provider, tell the healthcare staff immediately you have returned from visiting [place], [place], [place], [place] or [place]; or traveled in the [place] to [place], [place], [place], or [place]; in the last two weeks or you have been in close contact with a person diagnosed with COVID-19 in the last 2 weeks.   . Tell the health care staff about your symptoms: fever, cough and shortness of breath. . After you have been seen by a medical provider, you will be either: o Tested for (COVID-19) and discharged home on quarantine except to seek medical care if symptoms worsen, and asked to  - Stay home and avoid contact with others until you get your results (4-5 days)  - Avoid travel on public transportation if possible (such as bus, train, or airplane) or o Sent to the Emergency Department by EMS for evaluation, COVID-19 testing, and possible admission depending on your condition and test results.  What to do if you are LOW RISK for COVID-19?  Reduce your risk of any infection by using the same precautions used for avoiding the common cold or flu:  . Wash your hands often with soap and warm water for at least 20 seconds.  If soap and water are not readily available, use an alcohol-based hand sanitizer with at least 60% alcohol.  . If coughing or sneezing,   cover your mouth and nose by coughing or sneezing into the elbow areas of your shirt or coat, into a tissue or into your sleeve (not your hands). . Avoid shaking hands with others and consider head nods or verbal greetings only. . Avoid touching your eyes, nose, or mouth with unwashed hands.  . Avoid close contact with people who are sick. . Avoid places or events with large numbers of people in one location, like concerts or sporting  events. . Carefully consider travel plans you have or are making. . If you are planning any travel outside or inside the [place], visit the CDC's Travelers' Health webpage for the latest health notices. . If you have some symptoms but not all symptoms, continue to monitor at home and seek medical attention if your symptoms worsen. . If you are having a medical emergency, call 911.   ADDITIONAL HEALTHCARE OPTIONS FOR PATIENTS  [facility] / e-Visit: [email or web address]         [facility]: [phone]  [facility]: [phone]                   [facility]: [phone]     

## 2019-03-30 NOTE — Patient Instructions (Signed)

## 2019-04-06 ENCOUNTER — Encounter: Payer: Self-pay | Admitting: *Deleted

## 2019-04-06 ENCOUNTER — Inpatient Hospital Stay: Payer: BC Managed Care – PPO

## 2019-04-06 ENCOUNTER — Other Ambulatory Visit: Payer: Self-pay

## 2019-04-06 VITALS — BP 95/67 | HR 88 | Temp 98.0°F | Resp 16

## 2019-04-06 DIAGNOSIS — Z170 Estrogen receptor positive status [ER+]: Secondary | ICD-10-CM

## 2019-04-06 DIAGNOSIS — Z95828 Presence of other vascular implants and grafts: Secondary | ICD-10-CM

## 2019-04-06 DIAGNOSIS — C50011 Malignant neoplasm of nipple and areola, right female breast: Secondary | ICD-10-CM

## 2019-04-06 LAB — CMP (CANCER CENTER ONLY)
ALT: 30 U/L (ref 0–44)
AST: 34 U/L (ref 15–41)
Albumin: 3.2 g/dL — ABNORMAL LOW (ref 3.5–5.0)
Alkaline Phosphatase: 193 U/L — ABNORMAL HIGH (ref 38–126)
Anion gap: 8 (ref 5–15)
BUN: 5 mg/dL — ABNORMAL LOW (ref 6–20)
CO2: 26 mmol/L (ref 22–32)
Calcium: 8.8 mg/dL — ABNORMAL LOW (ref 8.9–10.3)
Chloride: 104 mmol/L (ref 98–111)
Creatinine: 0.69 mg/dL (ref 0.44–1.00)
GFR, Est AFR Am: >60 mL/min (ref >60)
GFR, Estimated: >60 mL/min (ref >60)
Glucose, Bld: 111 mg/dL — ABNORMAL HIGH (ref 70–99)
Potassium: 3.4 mmol/L — ABNORMAL LOW (ref 3.5–5.1)
Sodium: 138 mmol/L (ref 135–145)
Total Bilirubin: 0.3 mg/dL (ref 0.3–1.2)
Total Protein: 7.1 g/dL (ref 6.5–8.1)

## 2019-04-06 LAB — CBC WITH DIFFERENTIAL (CANCER CENTER ONLY)
Abs Immature Granulocytes: 0.01 10*3/uL (ref 0.00–0.07)
Basophils Absolute: 0 10*3/uL (ref 0.0–0.1)
Basophils Relative: 0 %
Eosinophils Absolute: 0.2 10*3/uL (ref 0.0–0.5)
Eosinophils Relative: 4 %
HCT: 28.7 % — ABNORMAL LOW (ref 36.0–46.0)
Hemoglobin: 9 g/dL — ABNORMAL LOW (ref 12.0–15.0)
Immature Granulocytes: 0 %
Lymphocytes Relative: 13 %
Lymphs Abs: 0.5 10*3/uL — ABNORMAL LOW (ref 0.7–4.0)
MCH: 28.7 pg (ref 26.0–34.0)
MCHC: 31.4 g/dL (ref 30.0–36.0)
MCV: 91.4 fL (ref 80.0–100.0)
Monocytes Absolute: 0.5 10*3/uL (ref 0.1–1.0)
Monocytes Relative: 12 %
Neutro Abs: 2.8 10*3/uL (ref 1.7–7.7)
Neutrophils Relative %: 71 %
Platelet Count: 246 10*3/uL (ref 150–400)
RBC: 3.14 MIL/uL — ABNORMAL LOW (ref 3.87–5.11)
RDW: 15.4 % (ref 11.5–15.5)
WBC Count: 3.9 10*3/uL — ABNORMAL LOW (ref 4.0–10.5)
nRBC: 0 % (ref 0.0–0.2)

## 2019-04-06 MED ORDER — DIPHENHYDRAMINE HCL 50 MG/ML IJ SOLN
25.0000 mg | Freq: Once | INTRAMUSCULAR | Status: AC
  Administered 2019-04-06: 09:00:00 25 mg via INTRAVENOUS

## 2019-04-06 MED ORDER — FAMOTIDINE IN NACL 20-0.9 MG/50ML-% IV SOLN
INTRAVENOUS | Status: AC
  Filled 2019-04-06: qty 50

## 2019-04-06 MED ORDER — DIPHENHYDRAMINE HCL 50 MG/ML IJ SOLN
INTRAMUSCULAR | Status: AC
  Filled 2019-04-06: qty 1

## 2019-04-06 MED ORDER — FAMOTIDINE IN NACL 20-0.9 MG/50ML-% IV SOLN
20.0000 mg | Freq: Once | INTRAVENOUS | Status: AC
  Administered 2019-04-06: 09:00:00 20 mg via INTRAVENOUS

## 2019-04-06 MED ORDER — SODIUM CHLORIDE 0.9 % IV SOLN
60.0000 mg/m2 | Freq: Once | INTRAVENOUS | Status: AC
  Administered 2019-04-06: 96 mg via INTRAVENOUS
  Filled 2019-04-06: qty 16

## 2019-04-06 MED ORDER — HEPARIN SOD (PORK) LOCK FLUSH 100 UNIT/ML IV SOLN
500.0000 [IU] | Freq: Once | INTRAVENOUS | Status: AC | PRN
  Administered 2019-04-06: 12:00:00 500 [IU]
  Filled 2019-04-06: qty 5

## 2019-04-06 MED ORDER — SODIUM CHLORIDE 0.9% FLUSH
10.0000 mL | INTRAVENOUS | Status: DC | PRN
  Administered 2019-04-06: 12:00:00 10 mL
  Filled 2019-04-06: qty 10

## 2019-04-06 MED ORDER — SODIUM CHLORIDE 0.9 % IV SOLN
Freq: Once | INTRAVENOUS | Status: AC
  Administered 2019-04-06: 09:00:00 via INTRAVENOUS
  Filled 2019-04-06: qty 250

## 2019-04-06 MED ORDER — SODIUM CHLORIDE 0.9 % IV SOLN
20.0000 mg | Freq: Once | INTRAVENOUS | Status: AC
  Administered 2019-04-06: 10:00:00 20 mg via INTRAVENOUS
  Filled 2019-04-06: qty 20

## 2019-04-06 MED ORDER — SODIUM CHLORIDE 0.9% FLUSH
10.0000 mL | INTRAVENOUS | Status: DC | PRN
  Administered 2019-04-06: 10 mL
  Filled 2019-04-06: qty 10

## 2019-04-06 NOTE — Patient Instructions (Signed)
[facility] Discharge Instructions for Patients Receiving Chemotherapy  Today you received the following chemotherapy agents Paclitaxel (TAXOL).  To help prevent nausea and vomiting after your treatment, we encourage you to take your nausea medication as prescribed.  If you develop nausea and vomiting that is not controlled by your nausea medication, call the clinic.   BELOW ARE SYMPTOMS THAT SHOULD BE REPORTED IMMEDIATELY:  *FEVER GREATER THAN 100.5 F  *CHILLS WITH OR WITHOUT FEVER  NAUSEA AND VOMITING THAT IS NOT CONTROLLED WITH YOUR NAUSEA MEDICATION  *UNUSUAL SHORTNESS OF BREATH  *UNUSUAL BRUISING OR BLEEDING  TENDERNESS IN MOUTH AND THROAT WITH OR WITHOUT PRESENCE OF ULCERS  *URINARY PROBLEMS  *BOWEL PROBLEMS  UNUSUAL RASH Items with * indicate a potential emergency and should be followed up as soon as possible.  Feel free to call the clinic should you have any questions or concerns. The clinic phone number is [phone].  Please show the CHEMO ALERT CARD at check-in to the Emergency Department and triage nurse.  Coronavirus (COVID-19) Are you at risk?  Are you at risk for the Coronavirus (COVID-19)?  To be considered HIGH RISK for Coronavirus (COVID-19), you have to meet the following criteria:  . Traveled to [place], [place], [place], [place] or [place]; or in the [place] to [place], [place], [place], or [place]; and have fever, cough, and shortness of breath within the last 2 weeks of travel OR . Been in close contact with a person diagnosed with COVID-19 within the last 2 weeks and have fever, cough, and shortness of breath . IF YOU DO NOT MEET THESE CRITERIA, YOU ARE CONSIDERED LOW RISK FOR COVID-19.  What to do if you are HIGH RISK for COVID-19?  . If you are having a medical emergency, call 911. . Seek medical care right away. Before you go to a doctor's office, urgent care or emergency department, call ahead and tell them about  your recent travel, contact with someone diagnosed with COVID-19, and your symptoms. You should receive instructions from your physician's office regarding next steps of care.  . When you arrive at healthcare provider, tell the healthcare staff immediately you have returned from visiting [place], [place], [place], [place] or [place]; or traveled in the [place] to [place], [place], [place], or [place]; in the last two weeks or you have been in close contact with a person diagnosed with COVID-19 in the last 2 weeks.   . Tell the health care staff about your symptoms: fever, cough and shortness of breath. . After you have been seen by a medical provider, you will be either: o Tested for (COVID-19) and discharged home on quarantine except to seek medical care if symptoms worsen, and asked to  - Stay home and avoid contact with others until you get your results (4-5 days)  - Avoid travel on public transportation if possible (such as bus, train, or airplane) or o Sent to the Emergency Department by EMS for evaluation, COVID-19 testing, and possible admission depending on your condition and test results.  What to do if you are LOW RISK for COVID-19?  Reduce your risk of any infection by using the same precautions used for avoiding the common cold or flu:  . Wash your hands often with soap and warm water for at least 20 seconds.  If soap and water are not readily available, use an alcohol-based hand sanitizer with at least 60% alcohol.  . If coughing or sneezing,   cover your mouth and nose by coughing or sneezing into the elbow areas of your shirt or coat, into a tissue or into your sleeve (not your hands). . Avoid shaking hands with others and consider head nods or verbal greetings only. . Avoid touching your eyes, nose, or mouth with unwashed hands.  . Avoid close contact with people who are sick. . Avoid places or events with large numbers of people in one location, like concerts or sporting  events. . Carefully consider travel plans you have or are making. . If you are planning any travel outside or inside the [place], visit the CDC's Travelers' Health webpage for the latest health notices. . If you have some symptoms but not all symptoms, continue to monitor at home and seek medical attention if your symptoms worsen. . If you are having a medical emergency, call 911.   ADDITIONAL HEALTHCARE OPTIONS FOR PATIENTS  [facility] / e-Visit: [email or web address]         [facility]: [phone]  [facility]: [phone]                   [facility]: [phone]     

## 2019-04-13 ENCOUNTER — Inpatient Hospital Stay (HOSPITAL_BASED_OUTPATIENT_CLINIC_OR_DEPARTMENT_OTHER): Payer: BC Managed Care – PPO | Admitting: Adult Health

## 2019-04-13 ENCOUNTER — Encounter: Payer: Self-pay | Admitting: Adult Health

## 2019-04-13 ENCOUNTER — Inpatient Hospital Stay: Payer: BC Managed Care – PPO

## 2019-04-13 ENCOUNTER — Inpatient Hospital Stay: Payer: BC Managed Care – PPO | Attending: Hematology and Oncology

## 2019-04-13 ENCOUNTER — Other Ambulatory Visit: Payer: Self-pay

## 2019-04-13 VITALS — BP 97/55 | HR 93 | Temp 98.7°F | Resp 18 | Ht 61.0 in | Wt 109.5 lb

## 2019-04-13 DIAGNOSIS — C50011 Malignant neoplasm of nipple and areola, right female breast: Secondary | ICD-10-CM | POA: Insufficient documentation

## 2019-04-13 DIAGNOSIS — R5383 Other fatigue: Secondary | ICD-10-CM

## 2019-04-13 DIAGNOSIS — Z5111 Encounter for antineoplastic chemotherapy: Secondary | ICD-10-CM

## 2019-04-13 DIAGNOSIS — M791 Myalgia, unspecified site: Secondary | ICD-10-CM | POA: Insufficient documentation

## 2019-04-13 DIAGNOSIS — R200 Anesthesia of skin: Secondary | ICD-10-CM | POA: Diagnosis not present

## 2019-04-13 DIAGNOSIS — H538 Other visual disturbances: Secondary | ICD-10-CM

## 2019-04-13 DIAGNOSIS — Z9011 Acquired absence of right breast and nipple: Secondary | ICD-10-CM

## 2019-04-13 DIAGNOSIS — R05 Cough: Secondary | ICD-10-CM

## 2019-04-13 DIAGNOSIS — Z170 Estrogen receptor positive status [ER+]: Secondary | ICD-10-CM

## 2019-04-13 DIAGNOSIS — Z95828 Presence of other vascular implants and grafts: Secondary | ICD-10-CM

## 2019-04-13 LAB — CMP (CANCER CENTER ONLY)
ALT: 23 U/L (ref 0–44)
AST: 25 U/L (ref 15–41)
Albumin: 3.4 g/dL — ABNORMAL LOW (ref 3.5–5.0)
Alkaline Phosphatase: 146 U/L — ABNORMAL HIGH (ref 38–126)
Anion gap: 9 (ref 5–15)
BUN: 9 mg/dL (ref 6–20)
CO2: 25 mmol/L (ref 22–32)
Calcium: 9.1 mg/dL (ref 8.9–10.3)
Chloride: 106 mmol/L (ref 98–111)
Creatinine: 0.64 mg/dL (ref 0.44–1.00)
GFR, Est AFR Am: >60 mL/min (ref >60)
GFR, Estimated: >60 mL/min (ref >60)
Glucose, Bld: 113 mg/dL — ABNORMAL HIGH (ref 70–99)
Potassium: 3.7 mmol/L (ref 3.5–5.1)
Sodium: 140 mmol/L (ref 135–145)
Total Bilirubin: 0.3 mg/dL (ref 0.3–1.2)
Total Protein: 7.3 g/dL (ref 6.5–8.1)

## 2019-04-13 LAB — CBC WITH DIFFERENTIAL (CANCER CENTER ONLY)
Abs Immature Granulocytes: 0.02 10*3/uL (ref 0.00–0.07)
Basophils Absolute: 0 10*3/uL (ref 0.0–0.1)
Basophils Relative: 1 %
Eosinophils Absolute: 0.2 10*3/uL (ref 0.0–0.5)
Eosinophils Relative: 4 %
HCT: 30.9 % — ABNORMAL LOW (ref 36.0–46.0)
Hemoglobin: 9.6 g/dL — ABNORMAL LOW (ref 12.0–15.0)
Immature Granulocytes: 1 %
Lymphocytes Relative: 15 %
Lymphs Abs: 0.6 10*3/uL — ABNORMAL LOW (ref 0.7–4.0)
MCH: 28.7 pg (ref 26.0–34.0)
MCHC: 31.1 g/dL (ref 30.0–36.0)
MCV: 92.2 fL (ref 80.0–100.0)
Monocytes Absolute: 0.4 10*3/uL (ref 0.1–1.0)
Monocytes Relative: 12 %
Neutro Abs: 2.6 10*3/uL (ref 1.7–7.7)
Neutrophils Relative %: 67 %
Platelet Count: 286 10*3/uL (ref 150–400)
RBC: 3.35 MIL/uL — ABNORMAL LOW (ref 3.87–5.11)
RDW: 15.5 % (ref 11.5–15.5)
WBC Count: 3.8 10*3/uL — ABNORMAL LOW (ref 4.0–10.5)
nRBC: 0 % (ref 0.0–0.2)

## 2019-04-13 MED ORDER — DIPHENHYDRAMINE HCL 50 MG/ML IJ SOLN
25.0000 mg | Freq: Once | INTRAMUSCULAR | Status: AC
  Administered 2019-04-13: 25 mg via INTRAVENOUS

## 2019-04-13 MED ORDER — FAMOTIDINE IN NACL 20-0.9 MG/50ML-% IV SOLN
INTRAVENOUS | Status: AC
  Filled 2019-04-13: qty 50

## 2019-04-13 MED ORDER — SODIUM CHLORIDE 0.9% FLUSH
10.0000 mL | INTRAVENOUS | Status: DC | PRN
  Administered 2019-04-13: 10 mL
  Filled 2019-04-13: qty 10

## 2019-04-13 MED ORDER — HEPARIN SOD (PORK) LOCK FLUSH 100 UNIT/ML IV SOLN
500.0000 [IU] | Freq: Once | INTRAVENOUS | Status: AC | PRN
  Administered 2019-04-13: 12:00:00 500 [IU]
  Filled 2019-04-13: qty 5

## 2019-04-13 MED ORDER — DEXAMETHASONE SODIUM PHOSPHATE 10 MG/ML IJ SOLN
10.0000 mg | Freq: Once | INTRAMUSCULAR | Status: AC
  Administered 2019-04-13: 11:00:00 10 mg via INTRAVENOUS

## 2019-04-13 MED ORDER — SODIUM CHLORIDE 0.9 % IV SOLN
Freq: Once | INTRAVENOUS | Status: AC
  Administered 2019-04-13: 10:00:00 via INTRAVENOUS
  Filled 2019-04-13: qty 250

## 2019-04-13 MED ORDER — FAMOTIDINE IN NACL 20-0.9 MG/50ML-% IV SOLN
20.0000 mg | Freq: Once | INTRAVENOUS | Status: AC
  Administered 2019-04-13: 20 mg via INTRAVENOUS

## 2019-04-13 MED ORDER — SODIUM CHLORIDE 0.9 % IV SOLN
60.0000 mg/m2 | Freq: Once | INTRAVENOUS | Status: AC
  Administered 2019-04-13: 11:00:00 96 mg via INTRAVENOUS
  Filled 2019-04-13: qty 16

## 2019-04-13 MED ORDER — DIPHENHYDRAMINE HCL 50 MG/ML IJ SOLN
INTRAMUSCULAR | Status: AC
  Filled 2019-04-13: qty 1

## 2019-04-13 MED ORDER — SODIUM CHLORIDE 0.9 % IV SOLN
20.0000 mg | Freq: Once | INTRAVENOUS | Status: DC
  Filled 2019-04-13: qty 2

## 2019-04-13 MED ORDER — DEXAMETHASONE SODIUM PHOSPHATE 10 MG/ML IJ SOLN
INTRAMUSCULAR | Status: AC
  Filled 2019-04-13: qty 1

## 2019-04-13 NOTE — Progress Notes (Signed)
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, estrogen receptor positive (Ariana Luna)   09/30/2018 Surgery    Right mastectomy with axillary lymph node dissection in [place]: Grade 2 IDC, 5.3 cm, DCIS, margins negative, lymphovascular invasion present, 0/18 lymph nodes, ER 60%, PR 0%, HER-2 negative, Ki-67 90%, T3N0 stage II B    11/15/2018 Cancer Staging    Staging form: Breast, AJCC 8th Edition - Pathologic: Stage IIB (pT3, pN0, cM0, G2, ER+, PR-, HER2-) - Signed by Nicholas Lose, MD on 11/15/2018    11/29/2018 Oncotype testing    Oncotype DX score 28: 17% risk of distant recurrence at 9 years    12/08/2018 Genetic Testing    Negative genetic testing on the common hereditary cancer panel.  The Hereditary Gene Panel offered by [facility] includes sequencing and/or deletion duplication testing of the following 47 genes: APC, ATM, AXIN2, BARD1, BMPR1A, BRCA1, BRCA2, BRIP1, CDH1, CDK4, CDKN2A (p14ARF), CDKN2A (p16INK4a), CHEK2, CTNNA1, DICER1, EPCAM (Deletion/duplication testing only), GREM1 (promoter region deletion/duplication testing only), KIT, MEN1, MLH1, MSH2, MSH3, MSH6, MUTYH, NBN, NF1, NHTL1, PALB2, PDGFRA, PMS2, POLD1, POLE, PTEN, RAD50, RAD51C, RAD51D, SDHB, SDHC, SDHD, SMAD4, SMARCA4. STK11, TP53, TSC1, TSC2, and VHL.  The following genes were evaluated for sequence changes only: SDHA and HOXB13 c.251G>A variant only. The report date is December 08, 2018.    12/29/2018 -  Chemotherapy    Adjuvant chemotherapy with dose dense Adriamycin and Cytoxan followed by Taxol      CURRENT THERAPY: Weekly Taxol  INTERVAL HISTORY: Ariana Luna 43 y.o. female returns for evaluation prior to receiving week 8 of her weekly Taxol.  She is accompanied by [place] interpreter [place].  The patient is experiencing fatigue.  She notes sometimes it is worse than others.  She is still taking short walks.  She remains active with her ADLs, including cooking, and this helps some to offset her fatigue.  She has not had any nausea or vomiting.  She is not experiencing headaches.  She denies any numbness or tingling in the fingertips or toes.  She denies any skin rashes or lesions.  She does have some nail changes.  She does have a cough, this started about 4 weeks ago.  It has improved.     Patient Active Problem List   Diagnosis Date Noted  . Port-A-Cath in place 12/29/2018  . Genetic testing 12/11/2018  . 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  MEDICAL HISTORY: Past Medical History:  Diagnosis Date  . Breast cancer (Ariana Luna)   . PONV (postoperative nausea and vomiting)     SURGICAL HISTORY: Past Surgical History:  Procedure Laterality Date  . IR IMAGING GUIDED PORT INSERTION  12/28/2018  . MASTECTOMY Right     SOCIAL HISTORY: Social History   Socioeconomic History  . Marital status: Married    Spouse name: Not on  file  . Number of children: Not on file  . Years of education: Not on file  . Highest education level: Not on file  Occupational History  . Not on file  Social Needs  . Financial resource strain: Not on file  . Food insecurity:    Worry: Not on file    Inability: Not on file  . Transportation needs:    Medical: Not on file    Non-medical: Not on file  Tobacco Use  . Smoking status: Never Smoker  . Smokeless  tobacco: Never Used  Substance and Sexual Activity  . Alcohol use: Not on file  . Drug use: Not on file  . Sexual activity: Not on file  Lifestyle  . Physical activity:    Days per week: Not on file    Minutes per session: Not on file  . Stress: Not on file  Relationships  . Social connections:    Talks on phone: Not on file    Gets together: Not on file    Attends religious service: Not on file    Active member of club or organization: Not on file    Attends meetings of clubs or organizations: Not on file    Relationship status: Not on file  . Intimate partner violence:    Fear of current or ex partner: Not on file    Emotionally abused: Not on file    Physically abused: Not on file    Forced sexual activity: Not on file  Other Topics Concern  . Not on file  Social History Narrative  . Not on file    FAMILY HISTORY: Non Contributory.    Review of Systems  Constitutional: Positive for fatigue. Negative for appetite change, chills, fever and unexpected weight change.  HENT:   Negative for hearing loss, lump/mass, mouth sores and trouble swallowing.   Eyes: Negative for eye problems and icterus.  Respiratory: Negative for chest tightness, cough and shortness of breath.   Cardiovascular: Negative for chest pain, leg swelling and palpitations.  Gastrointestinal: Negative for abdominal distention, abdominal pain, constipation, diarrhea, nausea and vomiting.  Endocrine: Negative for hot flashes.  Genitourinary: Negative for difficulty urinating.   Musculoskeletal: Negative for arthralgias.  Skin: Negative for itching and rash.  Neurological: Negative for dizziness, extremity weakness, headaches and numbness.  Hematological: Negative for adenopathy. Does not bruise/bleed easily.  Psychiatric/Behavioral: Negative for depression. The patient is not nervous/anxious.       PHYSICAL EXAMINATION  ECOG PERFORMANCE STATUS: 1 - Symptomatic but completely ambulatory  Vitals:    04/13/19 0940  BP: (!) 97/55  Pulse: 93  Resp: 18  Temp: 98.7 F (37.1 C)  SpO2: 100%    Physical Exam Constitutional:      General: She is not in acute distress.    Appearance: Normal appearance. She is not toxic-appearing.  HENT:     Head: Normocephalic and atraumatic.     Mouth/Throat:     Mouth: Mucous membranes are moist.     Pharynx: Oropharynx is clear. No oropharyngeal exudate or posterior oropharyngeal erythema.  Eyes:     General: No scleral icterus.    Pupils: Pupils are equal, round, and reactive to light.  Neck:     Musculoskeletal: Neck supple.  Cardiovascular:     Rate and Rhythm: Normal rate and regular rhythm.     Pulses: Normal pulses.     Heart sounds: Normal heart sounds.  Pulmonary:     Effort: Pulmonary effort is normal.  Breath sounds: Normal breath sounds.  Abdominal:     General: Abdomen is flat.     Palpations: Abdomen is soft.  Musculoskeletal:        General: No swelling.  Lymphadenopathy:     Cervical: No cervical adenopathy.  Skin:    General: Skin is warm and dry.     Capillary Refill: Capillary refill takes less than 2 seconds.     Findings: No rash.  Neurological:     General: No focal deficit present.     Mental Status: She is alert.  Psychiatric:        Mood and Affect: Mood normal.        Behavior: Behavior normal.     LABORATORY DATA:  CBC    Component Value Date/Time   WBC 3.8 (L) 04/13/2019 0930   WBC 5.4 12/28/2018 1221   RBC 3.35 (L) 04/13/2019 0930   HGB 9.6 (L) 04/13/2019 0930   HCT 30.9 (L) 04/13/2019 0930   PLT 286 04/13/2019 0930   MCV 92.2 04/13/2019 0930   MCH 28.7 04/13/2019 0930   MCHC 31.1 04/13/2019 0930   RDW 15.5 04/13/2019 0930   LYMPHSABS 0.6 (L) 04/13/2019 0930   MONOABS 0.4 04/13/2019 0930   EOSABS 0.2 04/13/2019 0930   BASOSABS 0.0 04/13/2019 0930    CMP     Component Value Date/Time   NA 140 04/13/2019 0930   K 3.7 04/13/2019 0930   CL 106 04/13/2019 0930   CO2 25 04/13/2019  0930   GLUCOSE 113 (H) 04/13/2019 0930   BUN 9 04/13/2019 0930   CREATININE 0.64 04/13/2019 0930   CALCIUM 9.1 04/13/2019 0930   PROT 7.3 04/13/2019 0930   ALBUMIN 3.4 (L) 04/13/2019 0930   AST 25 04/13/2019 0930   ALT 23 04/13/2019 0930   ALKPHOS 146 (H) 04/13/2019 0930   BILITOT 0.3 04/13/2019 0930   GFRNONAA >60 04/13/2019 0930   GFRAA >60 04/13/2019 0930        ASSESSMENT and PLAN:   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 Adjuvant radiation therapy because of the tumor size being large 3. Followed by adjuvant antiestrogen therapy with tamoxifen 20 mg daily x10 years ------------------------------------------------------------------------------------------------------------------- Current Treatment:Completed 4 cycles ofAdriamycin and Cytoxan, today is cycle6Taxol ECHO feb 2020: EF 60-65% Labs reviewed  Chemo toxicities: 1.Nausea and vomiting: Resolved  2.Headache:Resolved 3.Fatigue: managing well 4. Muscle aches and pains 5. Blurred vision, likely due to steroids, reduced premed Dex dosage.   Monitoring closely for peripheral neuropathy, which there is none.   Return to clinicweekly for Taxol and every other week for follow-up with me.  All questions were answered. The patient knows to call the clinic with any problems, questions or concerns. We can certainly see the patient much sooner if necessary.  A total of (30) minutes of face-to-face time was spent with this patient with greater than 50% of that time in  counseling and care-coordination.   This note was electronically signed. Scot Dock, NP 04/13/2019

## 2019-04-13 NOTE — Patient Instructions (Signed)
[facility] Discharge Instructions for Patients Receiving Chemotherapy  Today you received the following chemotherapy agents Paclitaxel (TAXOL).  To help prevent nausea and vomiting after your treatment, we encourage you to take your nausea medication as prescribed.  If you develop nausea and vomiting that is not controlled by your nausea medication, call the clinic.   BELOW ARE SYMPTOMS THAT SHOULD BE REPORTED IMMEDIATELY:  *FEVER GREATER THAN 100.5 F  *CHILLS WITH OR WITHOUT FEVER  NAUSEA AND VOMITING THAT IS NOT CONTROLLED WITH YOUR NAUSEA MEDICATION  *UNUSUAL SHORTNESS OF BREATH  *UNUSUAL BRUISING OR BLEEDING  TENDERNESS IN MOUTH AND THROAT WITH OR WITHOUT PRESENCE OF ULCERS  *URINARY PROBLEMS  *BOWEL PROBLEMS  UNUSUAL RASH Items with * indicate a potential emergency and should be followed up as soon as possible.  Feel free to call the clinic should you have any questions or concerns. The clinic phone number is [phone].  Please show the CHEMO ALERT CARD at check-in to the Emergency Department and triage nurse.  Coronavirus (COVID-19) Are you at risk?  Are you at risk for the Coronavirus (COVID-19)?  To be considered HIGH RISK for Coronavirus (COVID-19), you have to meet the following criteria:  . Traveled to [place], [place], [place], [place] or [place]; or in the [place] to [place], [place], [place], or [place]; and have fever, cough, and shortness of breath within the last 2 weeks of travel OR . Been in close contact with a person diagnosed with COVID-19 within the last 2 weeks and have fever, cough, and shortness of breath . IF YOU DO NOT MEET THESE CRITERIA, YOU ARE CONSIDERED LOW RISK FOR COVID-19.  What to do if you are HIGH RISK for COVID-19?  . If you are having a medical emergency, call 911. . Seek medical care right away. Before you go to a doctor's office, urgent care or emergency department, call ahead and tell them about  your recent travel, contact with someone diagnosed with COVID-19, and your symptoms. You should receive instructions from your physician's office regarding next steps of care.  . When you arrive at healthcare provider, tell the healthcare staff immediately you have returned from visiting [place], [place], [place], [place] or [place]; or traveled in the [place] to [place], [place], [place], or [place]; in the last two weeks or you have been in close contact with a person diagnosed with COVID-19 in the last 2 weeks.   . Tell the health care staff about your symptoms: fever, cough and shortness of breath. . After you have been seen by a medical provider, you will be either: o Tested for (COVID-19) and discharged home on quarantine except to seek medical care if symptoms worsen, and asked to  - Stay home and avoid contact with others until you get your results (4-5 days)  - Avoid travel on public transportation if possible (such as bus, train, or airplane) or o Sent to the Emergency Department by EMS for evaluation, COVID-19 testing, and possible admission depending on your condition and test results.  What to do if you are LOW RISK for COVID-19?  Reduce your risk of any infection by using the same precautions used for avoiding the common cold or flu:  . Wash your hands often with soap and warm water for at least 20 seconds.  If soap and water are not readily available, use an alcohol-based hand sanitizer with at least 60% alcohol.  . If coughing or sneezing,   cover your mouth and nose by coughing or sneezing into the elbow areas of your shirt or coat, into a tissue or into your sleeve (not your hands). . Avoid shaking hands with others and consider head nods or verbal greetings only. . Avoid touching your eyes, nose, or mouth with unwashed hands.  . Avoid close contact with people who are sick. . Avoid places or events with large numbers of people in one location, like concerts or sporting  events. . Carefully consider travel plans you have or are making. . If you are planning any travel outside or inside the [place], visit the CDC's Travelers' Health webpage for the latest health notices. . If you have some symptoms but not all symptoms, continue to monitor at home and seek medical attention if your symptoms worsen. . If you are having a medical emergency, call 911.   ADDITIONAL HEALTHCARE OPTIONS FOR PATIENTS  [facility] / e-Visit: [email or web address]         [facility]: [phone]  [facility]: [phone]                   [facility]: [phone]     

## 2019-04-13 NOTE — Patient Instructions (Signed)

## 2019-04-13 NOTE — Assessment & Plan Note (Addendum)
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 Adjuvant radiation therapy because of the tumor size being large 3. Followed by adjuvant antiestrogen therapy with tamoxifen 20 mg daily x10 years ------------------------------------------------------------------------------------------------------------------- Current Treatment:Completed 4 cycles ofAdriamycin and Cytoxan, today is cycle6Taxol ECHO feb 2020: EF 60-65% Labs reviewed  Chemo toxicities: 1.Nausea and vomiting: Resolved  2.Headache:Resolved 3.Fatigue: managing well 4. Muscle aches and pains 5. Blurred vision, likely due to steroids, reduced premed Dex dosage.   Monitoring closely for peripheral neuropathy, which there is none.   Return to clinicweekly for Taxol and every other week for follow-up with me.

## 2019-04-20 ENCOUNTER — Inpatient Hospital Stay: Payer: BC Managed Care – PPO

## 2019-04-20 ENCOUNTER — Other Ambulatory Visit: Payer: Self-pay

## 2019-04-20 VITALS — BP 95/66 | HR 69 | Temp 98.1°F | Resp 16

## 2019-04-20 DIAGNOSIS — Z95828 Presence of other vascular implants and grafts: Secondary | ICD-10-CM

## 2019-04-20 DIAGNOSIS — C50011 Malignant neoplasm of nipple and areola, right female breast: Secondary | ICD-10-CM

## 2019-04-20 LAB — CBC WITH DIFFERENTIAL (CANCER CENTER ONLY)
Abs Immature Granulocytes: 0.01 10*3/uL (ref 0.00–0.07)
Basophils Absolute: 0 10*3/uL (ref 0.0–0.1)
Basophils Relative: 1 %
Eosinophils Absolute: 0.1 10*3/uL (ref 0.0–0.5)
Eosinophils Relative: 5 %
HCT: 32.1 % — ABNORMAL LOW (ref 36.0–46.0)
Hemoglobin: 9.9 g/dL — ABNORMAL LOW (ref 12.0–15.0)
Immature Granulocytes: 0 %
Lymphocytes Relative: 29 %
Lymphs Abs: 0.7 10*3/uL (ref 0.7–4.0)
MCH: 28.6 pg (ref 26.0–34.0)
MCHC: 30.8 g/dL (ref 30.0–36.0)
MCV: 92.8 fL (ref 80.0–100.0)
Monocytes Absolute: 0.2 10*3/uL (ref 0.1–1.0)
Monocytes Relative: 10 %
Neutro Abs: 1.2 10*3/uL — ABNORMAL LOW (ref 1.7–7.7)
Neutrophils Relative %: 55 %
Platelet Count: 247 10*3/uL (ref 150–400)
RBC: 3.46 MIL/uL — ABNORMAL LOW (ref 3.87–5.11)
RDW: 15.5 % (ref 11.5–15.5)
WBC Count: 2.2 10*3/uL — ABNORMAL LOW (ref 4.0–10.5)
nRBC: 0 % (ref 0.0–0.2)

## 2019-04-20 LAB — CMP (CANCER CENTER ONLY)
ALT: 41 U/L (ref 0–44)
AST: 47 U/L — ABNORMAL HIGH (ref 15–41)
Albumin: 3.6 g/dL (ref 3.5–5.0)
Alkaline Phosphatase: 120 U/L (ref 38–126)
Anion gap: 9 (ref 5–15)
BUN: 6 mg/dL (ref 6–20)
CO2: 24 mmol/L (ref 22–32)
Calcium: 9.1 mg/dL (ref 8.9–10.3)
Chloride: 108 mmol/L (ref 98–111)
Creatinine: 0.64 mg/dL (ref 0.44–1.00)
GFR, Est AFR Am: >60 mL/min (ref >60)
GFR, Estimated: >60 mL/min (ref >60)
Glucose, Bld: 102 mg/dL — ABNORMAL HIGH (ref 70–99)
Potassium: 3.7 mmol/L (ref 3.5–5.1)
Sodium: 141 mmol/L (ref 135–145)
Total Bilirubin: <0.2 mg/dL — ABNORMAL LOW (ref 0.3–1.2)
Total Protein: 7.2 g/dL (ref 6.5–8.1)

## 2019-04-20 MED ORDER — DEXAMETHASONE SODIUM PHOSPHATE 10 MG/ML IJ SOLN
INTRAMUSCULAR | Status: AC
  Filled 2019-04-20: qty 1

## 2019-04-20 MED ORDER — DEXAMETHASONE SODIUM PHOSPHATE 10 MG/ML IJ SOLN
10.0000 mg | Freq: Once | INTRAMUSCULAR | Status: AC
  Administered 2019-04-20: 10 mg via INTRAVENOUS

## 2019-04-20 MED ORDER — SODIUM CHLORIDE 0.9 % IV SOLN
Freq: Once | INTRAVENOUS | Status: AC
  Administered 2019-04-20: 09:00:00 via INTRAVENOUS
  Filled 2019-04-20: qty 250

## 2019-04-20 MED ORDER — SODIUM CHLORIDE 0.9% FLUSH
10.0000 mL | INTRAVENOUS | Status: DC | PRN
  Administered 2019-04-20: 10 mL
  Filled 2019-04-20: qty 10

## 2019-04-20 MED ORDER — SODIUM CHLORIDE 0.9 % IV SOLN: 60.0000 mg/m2 | Freq: Once | INTRAVENOUS | Status: DC

## 2019-04-20 MED ORDER — DIPHENHYDRAMINE HCL 50 MG/ML IJ SOLN
25.0000 mg | Freq: Once | INTRAMUSCULAR | Status: AC
  Administered 2019-04-20: 25 mg via INTRAVENOUS

## 2019-04-20 MED ORDER — FAMOTIDINE IN NACL 20-0.9 MG/50ML-% IV SOLN
20.0000 mg | Freq: Once | INTRAVENOUS | Status: AC
  Administered 2019-04-20: 20 mg via INTRAVENOUS

## 2019-04-20 MED ORDER — HEPARIN SOD (PORK) LOCK FLUSH 100 UNIT/ML IV SOLN
500.0000 [IU] | Freq: Once | INTRAVENOUS | Status: AC | PRN
  Administered 2019-04-20: 500 [IU]
  Filled 2019-04-20: qty 5

## 2019-04-20 MED ORDER — DIPHENHYDRAMINE HCL 50 MG/ML IJ SOLN
INTRAMUSCULAR | Status: AC
  Filled 2019-04-20: qty 1

## 2019-04-20 MED ORDER — SODIUM CHLORIDE 0.9 % IV SOLN
60.0000 mg/m2 | Freq: Once | INTRAVENOUS | Status: AC
  Administered 2019-04-20: 90 mg via INTRAVENOUS
  Filled 2019-04-20: qty 15

## 2019-04-20 MED ORDER — FAMOTIDINE IN NACL 20-0.9 MG/50ML-% IV SOLN
INTRAVENOUS | Status: AC
  Filled 2019-04-20: qty 50

## 2019-04-20 NOTE — Assessment & Plan Note (Signed)
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 Adjuvant radiation therapy because of the tumor size being large 3. Followed by adjuvant antiestrogen therapy with tamoxifen 20 mg daily x10 years ------------------------------------------------------------------------------------------------------------------- Current Treatment:Completed 4 cycles ofAdriamycin and Cytoxan, todayiscycle8Taxol ECHO feb 2020: EF 60-65% Labs reviewed  Chemo toxicities: 1.Nausea and vomiting: Resolved  2.Headache:Resolved 3.Fatigue: managing well 4. Muscle aches and pains 5. Blurred vision, likely due to steroids, reduced premed Dex dosage.   Monitoring closely for peripheral neuropathy, which there is none.   Return to clinicweekly for Taxol and every other week for follow-up with me.

## 2019-04-20 NOTE — Patient Instructions (Signed)
[facility] Discharge Instructions for Patients Receiving Chemotherapy  Today you received the following chemotherapy agents Paclitaxel (TAXOL).  To help prevent nausea and vomiting after your treatment, we encourage you to take your nausea medication as prescribed.  If you develop nausea and vomiting that is not controlled by your nausea medication, call the clinic.   BELOW ARE SYMPTOMS THAT SHOULD BE REPORTED IMMEDIATELY:  *FEVER GREATER THAN 100.5 F  *CHILLS WITH OR WITHOUT FEVER  NAUSEA AND VOMITING THAT IS NOT CONTROLLED WITH YOUR NAUSEA MEDICATION  *UNUSUAL SHORTNESS OF BREATH  *UNUSUAL BRUISING OR BLEEDING  TENDERNESS IN MOUTH AND THROAT WITH OR WITHOUT PRESENCE OF ULCERS  *URINARY PROBLEMS  *BOWEL PROBLEMS  UNUSUAL RASH Items with * indicate a potential emergency and should be followed up as soon as possible.  Feel free to call the clinic should you have any questions or concerns. The clinic phone number is [phone].  Please show the CHEMO ALERT CARD at check-in to the Emergency Department and triage nurse.  Coronavirus (COVID-19) Are you at risk?  Are you at risk for the Coronavirus (COVID-19)?  To be considered HIGH RISK for Coronavirus (COVID-19), you have to meet the following criteria:  . Traveled to [place], [place], [place], [place] or [place]; or in the [place] to [place], [place], [place], or [place]; and have fever, cough, and shortness of breath within the last 2 weeks of travel OR . Been in close contact with a person diagnosed with COVID-19 within the last 2 weeks and have fever, cough, and shortness of breath . IF YOU DO NOT MEET THESE CRITERIA, YOU ARE CONSIDERED LOW RISK FOR COVID-19.  What to do if you are HIGH RISK for COVID-19?  . If you are having a medical emergency, call 911. . Seek medical care right away. Before you go to a doctor's office, urgent care or emergency department, call ahead and tell them about  your recent travel, contact with someone diagnosed with COVID-19, and your symptoms. You should receive instructions from your physician's office regarding next steps of care.  . When you arrive at healthcare provider, tell the healthcare staff immediately you have returned from visiting [place], [place], [place], [place] or [place]; or traveled in the [place] to [place], [place], [place], or [place]; in the last two weeks or you have been in close contact with a person diagnosed with COVID-19 in the last 2 weeks.   . Tell the health care staff about your symptoms: fever, cough and shortness of breath. . After you have been seen by a medical provider, you will be either: o Tested for (COVID-19) and discharged home on quarantine except to seek medical care if symptoms worsen, and asked to  - Stay home and avoid contact with others until you get your results (4-5 days)  - Avoid travel on public transportation if possible (such as bus, train, or airplane) or o Sent to the Emergency Department by EMS for evaluation, COVID-19 testing, and possible admission depending on your condition and test results.  What to do if you are LOW RISK for COVID-19?  Reduce your risk of any infection by using the same precautions used for avoiding the common cold or flu:  . Wash your hands often with soap and warm water for at least 20 seconds.  If soap and water are not readily available, use an alcohol-based hand sanitizer with at least 60% alcohol.  . If coughing or sneezing,   cover your mouth and nose by coughing or sneezing into the elbow areas of your shirt or coat, into a tissue or into your sleeve (not your hands). . Avoid shaking hands with others and consider head nods or verbal greetings only. . Avoid touching your eyes, nose, or mouth with unwashed hands.  . Avoid close contact with people who are sick. . Avoid places or events with large numbers of people in one location, like concerts or sporting  events. . Carefully consider travel plans you have or are making. . If you are planning any travel outside or inside the [place], visit the CDC's Travelers' Health webpage for the latest health notices. . If you have some symptoms but not all symptoms, continue to monitor at home and seek medical attention if your symptoms worsen. . If you are having a medical emergency, call 911.   ADDITIONAL HEALTHCARE OPTIONS FOR PATIENTS  [facility] / e-Visit: [email or web address]         [facility]: [phone]  [facility]: [phone]                   [facility]: [phone]     

## 2019-04-20 NOTE — Progress Notes (Signed)
Okay to treat today with ANC 1.2, per Dr. Lindi Adie.

## 2019-04-20 NOTE — Progress Notes (Signed)
Decrease taxol dose for weight loss per Dr. Lindi Adie.   Demetrius Charity, PharmD, [facility] Pharmacist Pharmacy Phone: [phone] 04/20/2019

## 2019-04-26 NOTE — Progress Notes (Signed)
Patient Care Team: Newton Pigg, MD as PCP - General (Obstetrics and Gynecology)  DIAGNOSIS:    ICD-10-CM   1. 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, estrogen receptor positive ([place])  09/30/2018 Surgery   Right mastectomy with axillary lymph node dissection in [place]: Grade 2 IDC, 5.3 cm, DCIS, margins negative, lymphovascular invasion present, 0/18 lymph nodes, ER 60%, PR 0%, HER-2 negative, Ki-67 90%, T3N0 stage II B   11/15/2018 Cancer Staging   Staging form: Breast, AJCC 8th Edition - Pathologic: Stage IIB (pT3, pN0, cM0, G2, ER+, PR-, HER2-) - Signed by Nicholas Lose, MD on 11/15/2018   11/29/2018 Oncotype testing   Oncotype DX score 28: 17% risk of distant recurrence at 9 years   12/08/2018 Genetic Testing   Negative genetic testing on the common hereditary cancer panel.  The Hereditary Gene Panel offered by [facility] includes sequencing and/or deletion duplication testing of the following 47 genes: APC, ATM, AXIN2, BARD1, BMPR1A, BRCA1, BRCA2, BRIP1, CDH1, CDK4, CDKN2A (p14ARF), CDKN2A (p16INK4a), CHEK2, CTNNA1, DICER1, EPCAM (Deletion/duplication testing only), GREM1 (promoter region deletion/duplication testing only), KIT, MEN1, MLH1, MSH2, MSH3, MSH6, MUTYH, NBN, NF1, NHTL1, PALB2, PDGFRA, PMS2, POLD1, POLE, PTEN, RAD50, RAD51C, RAD51D, SDHB, SDHC, SDHD, SMAD4, SMARCA4. STK11, TP53, TSC1, TSC2, and VHL.  The following genes were evaluated for sequence changes only: SDHA and HOXB13 c.251G>A variant only. The report date is December 08, 2018.   12/29/2018 -  Chemotherapy   Adjuvant chemotherapy with dose dense Adriamycin and Cytoxan followed by Taxol      CHIEF COMPLIANT: Cycle 10 Taxol  INTERVAL HISTORY: Ariana Luna is a 43 y.o. with above-mentioned  history of right breast cancer treated with right mastectomy in [place], and whocompleted 4 cycles ofadjuvant chemotherapy withdose denseAdriamycinand Cytoxan. Shepresents to the clinic todayfor cycle10 of weekly Taxol. Complaining of mild numbness of the tips of the fingers  REVIEW OF SYSTEMS:   Constitutional: Denies fevers, chills or abnormal weight loss Eyes: Denies blurriness of vision Ears, nose, mouth, throat, and face: Denies mucositis or sore throat Respiratory: Denies cough, dyspnea or wheezes Cardiovascular: Denies palpitation, chest discomfort Gastrointestinal: Denies nausea, heartburn or change in bowel habits Skin: Denies abnormal skin rashes Lymphatics: Denies new lymphadenopathy or easy bruising Neurological: Mild peripheral neuropathy Behavioral/Psych: Mood is stable, no new changes  Extremities: No lower extremity edema Breast: denies any pain or lumps or nodules in either breasts All other systems were reviewed with the patient and are negative.  I have reviewed the past medical history, past surgical history, social history and family history with the patient and they are unchanged from previous note.  ALLERGIES:  has No Known Allergies.  MEDICATIONS:  Current Outpatient Medications  Medication Sig Dispense Refill   amoxicillin (AMOXIL) 500 MG capsule Take 1 capsule (500 mg total) by mouth 2 (two) times daily. 14 capsule 0   lidocaine-prilocaine (EMLA) cream Apply 1 application topically as needed. 30 g 0   lidocaine-prilocaine (EMLA) cream Apply to affected area once 30 g 3   LORazepam (ATIVAN) 0.5 MG tablet Take 1 tablet (0.5 mg total) by mouth at bedtime as needed for sleep. 30 tablet 0   ondansetron (ZOFRAN) 8 MG tablet Take 1 tablet (8 mg total) by mouth 2 (two) times daily as needed. Start on the  third day after chemotherapy. 30 tablet 1   prochlorperazine (COMPAZINE) 10 MG tablet Take 1 tablet (10 mg total) by mouth every 6 (six) hours as needed  (Nausea or vomiting). 30 tablet 1   No current facility-administered medications for this visit.     PHYSICAL EXAMINATION: ECOG PERFORMANCE STATUS: 1 - Symptomatic but completely ambulatory  Vitals:   04/27/19 0832  BP: 103/66  Pulse: 94  Resp: 16  Temp: 98.5 F (36.9 C)  SpO2: 100%   Filed Weights   04/27/19 0832  Weight: 111 lb (50.3 kg)    GENERAL: alert, no distress and comfortable SKIN: skin color, texture, turgor are normal, no rashes or significant lesions EYES: normal, Conjunctiva are pink and non-injected, sclera clear OROPHARYNX: no exudate, no erythema and lips, buccal mucosa, and tongue normal  NECK: supple, thyroid normal size, non-tender, without nodularity LYMPH: no palpable lymphadenopathy in the cervical, axillary or inguinal LUNGS: clear to auscultation and percussion with normal breathing effort HEART: regular rate & rhythm and no murmurs and no lower extremity edema ABDOMEN: abdomen soft, non-tender and normal bowel sounds MUSCULOSKELETAL: no cyanosis of digits and no clubbing  NEURO: alert & oriented x 3 with fluent speech, peripheral neuropathy and nail changes in the fingers EXTREMITIES: No lower extremity edema  LABORATORY DATA:  I have reviewed the data as listed CMP Latest Ref Rng & Units 04/20/2019 04/13/2019 04/06/2019  Glucose 70 - 99 mg/dL 102(H) 113(H) 111(H)  BUN 6 - 20 mg/dL 6 9 5(L)  Creatinine 0.44 - 1.00 mg/dL 0.64 0.64 0.69  Sodium 135 - 145 mmol/L 141 140 138  Potassium 3.5 - 5.1 mmol/L 3.7 3.7 3.4(L)  Chloride 98 - 111 mmol/L 108 106 104  CO2 22 - 32 mmol/L 24 25 26   Calcium 8.9 - 10.3 mg/dL 9.1 9.1 8.8(L)  Total Protein 6.5 - 8.1 g/dL 7.2 7.3 7.1  Total Bilirubin 0.3 - 1.2 mg/dL <0.2(L) 0.3 0.3  Alkaline Phos 38 - 126 U/L 120 146(H) 193(H)  AST 15 - 41 U/L 47(H) 25 34  ALT 0 - 44 U/L 41 23 30    Lab Results  Component Value Date   WBC 2.7 (L) 04/27/2019   HGB 11.3 (L) 04/27/2019   HCT 36.1 04/27/2019   MCV 91.9 04/27/2019     PLT 244 04/27/2019   NEUTROABS 1.4 (L) 04/27/2019    ASSESSMENT & PLAN:  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 Adjuvant radiation therapy because of the tumor size being large 3. Followed by adjuvant antiestrogen therapy with tamoxifen 20 mg daily x10 years ------------------------------------------------------------------------------------------------------------------- Current Treatment:Completed 4 cycles ofAdriamycin and Cytoxan, todayiscycle8Taxol ECHO feb 2020: EF 60-65% Labs reviewed  Chemo toxicities: 1.Nausea and vomiting: Resolved  2.Headache:Resolved 3.Fatigue: managing well 4. Muscle aches and pains 5. Blurred vision, likely due to steroids, reduced premed Dex dosage.  6.  Chemo-induced peripheral neuropathy: I reduced the dosage of her chemotherapy today. 7.  Decreased neutrophil count: ANC 1.4.  Okay to treat.   Return to clinicweekly for Taxol and in 2 weeks for follow-up with me for her last chemo. After that she undergoes adjuvant radiation.  No orders of the defined types were placed in this encounter.  The patient has a good understanding of the overall plan. she agrees with it. she will call with any problems that may develop before the next visit here.  Nicholas Lose, MD 04/27/2019  Julious Oka Dorshimer am acting as scribe for Dr. Nicholas Lose.  I have reviewed the above documentation for accuracy and completeness, and I agree with the above.

## 2019-04-27 ENCOUNTER — Inpatient Hospital Stay: Payer: BC Managed Care – PPO

## 2019-04-27 ENCOUNTER — Inpatient Hospital Stay (HOSPITAL_BASED_OUTPATIENT_CLINIC_OR_DEPARTMENT_OTHER): Payer: BC Managed Care – PPO | Admitting: Hematology and Oncology

## 2019-04-27 ENCOUNTER — Other Ambulatory Visit: Payer: Self-pay

## 2019-04-27 DIAGNOSIS — H538 Other visual disturbances: Secondary | ICD-10-CM

## 2019-04-27 DIAGNOSIS — C50011 Malignant neoplasm of nipple and areola, right female breast: Secondary | ICD-10-CM

## 2019-04-27 DIAGNOSIS — Z5111 Encounter for antineoplastic chemotherapy: Secondary | ICD-10-CM

## 2019-04-27 DIAGNOSIS — R05 Cough: Secondary | ICD-10-CM

## 2019-04-27 DIAGNOSIS — R200 Anesthesia of skin: Secondary | ICD-10-CM

## 2019-04-27 DIAGNOSIS — Z9011 Acquired absence of right breast and nipple: Secondary | ICD-10-CM

## 2019-04-27 DIAGNOSIS — R5383 Other fatigue: Secondary | ICD-10-CM

## 2019-04-27 DIAGNOSIS — M791 Myalgia, unspecified site: Secondary | ICD-10-CM

## 2019-04-27 DIAGNOSIS — Z95828 Presence of other vascular implants and grafts: Secondary | ICD-10-CM

## 2019-04-27 DIAGNOSIS — Z170 Estrogen receptor positive status [ER+]: Secondary | ICD-10-CM

## 2019-04-27 LAB — CBC WITH DIFFERENTIAL (CANCER CENTER ONLY)
Abs Immature Granulocytes: 0.01 10*3/uL (ref 0.00–0.07)
Basophils Absolute: 0 10*3/uL (ref 0.0–0.1)
Basophils Relative: 1 %
Eosinophils Absolute: 0.1 10*3/uL (ref 0.0–0.5)
Eosinophils Relative: 3 %
HCT: 36.1 % (ref 36.0–46.0)
Hemoglobin: 11.3 g/dL — ABNORMAL LOW (ref 12.0–15.0)
Immature Granulocytes: 0 %
Lymphocytes Relative: 32 %
Lymphs Abs: 0.8 10*3/uL (ref 0.7–4.0)
MCH: 28.8 pg (ref 26.0–34.0)
MCHC: 31.3 g/dL (ref 30.0–36.0)
MCV: 91.9 fL (ref 80.0–100.0)
Monocytes Absolute: 0.3 10*3/uL (ref 0.1–1.0)
Monocytes Relative: 10 %
Neutro Abs: 1.4 10*3/uL — ABNORMAL LOW (ref 1.7–7.7)
Neutrophils Relative %: 54 %
Platelet Count: 244 10*3/uL (ref 150–400)
RBC: 3.93 MIL/uL (ref 3.87–5.11)
RDW: 15.5 % (ref 11.5–15.5)
WBC Count: 2.7 10*3/uL — ABNORMAL LOW (ref 4.0–10.5)
nRBC: 0 % (ref 0.0–0.2)

## 2019-04-27 LAB — CMP (CANCER CENTER ONLY)
ALT: 71 U/L — ABNORMAL HIGH (ref 0–44)
AST: 68 U/L — ABNORMAL HIGH (ref 15–41)
Albumin: 3.9 g/dL (ref 3.5–5.0)
Alkaline Phosphatase: 123 U/L (ref 38–126)
Anion gap: 11 (ref 5–15)
BUN: 15 mg/dL (ref 6–20)
CO2: 23 mmol/L (ref 22–32)
Calcium: 9.2 mg/dL (ref 8.9–10.3)
Chloride: 105 mmol/L (ref 98–111)
Creatinine: 0.67 mg/dL (ref 0.44–1.00)
GFR, Est AFR Am: >60 mL/min (ref >60)
GFR, Estimated: >60 mL/min (ref >60)
Glucose, Bld: 93 mg/dL (ref 70–99)
Potassium: 3.8 mmol/L (ref 3.5–5.1)
Sodium: 139 mmol/L (ref 135–145)
Total Bilirubin: 0.3 mg/dL (ref 0.3–1.2)
Total Protein: 7.7 g/dL (ref 6.5–8.1)

## 2019-04-27 MED ORDER — SODIUM CHLORIDE 0.9 % IV SOLN
45.0000 mg/m2 | Freq: Once | INTRAVENOUS | Status: AC
  Administered 2019-04-27: 66 mg via INTRAVENOUS
  Filled 2019-04-27: qty 11

## 2019-04-27 MED ORDER — SODIUM CHLORIDE 0.9% FLUSH
10.0000 mL | INTRAVENOUS | Status: DC | PRN
  Administered 2019-04-27: 10 mL
  Filled 2019-04-27: qty 10

## 2019-04-27 MED ORDER — HEPARIN SOD (PORK) LOCK FLUSH 100 UNIT/ML IV SOLN
500.0000 [IU] | Freq: Once | INTRAVENOUS | Status: AC | PRN
  Administered 2019-04-27: 500 [IU]
  Filled 2019-04-27: qty 5

## 2019-04-27 MED ORDER — DIPHENHYDRAMINE HCL 50 MG/ML IJ SOLN
INTRAMUSCULAR | Status: AC
  Filled 2019-04-27: qty 1

## 2019-04-27 MED ORDER — FAMOTIDINE IN NACL 20-0.9 MG/50ML-% IV SOLN
20.0000 mg | Freq: Once | INTRAVENOUS | Status: AC
  Administered 2019-04-27: 20 mg via INTRAVENOUS

## 2019-04-27 MED ORDER — FAMOTIDINE IN NACL 20-0.9 MG/50ML-% IV SOLN
INTRAVENOUS | Status: AC
  Filled 2019-04-27: qty 50

## 2019-04-27 MED ORDER — DEXAMETHASONE SODIUM PHOSPHATE 10 MG/ML IJ SOLN
10.0000 mg | Freq: Once | INTRAMUSCULAR | Status: AC
  Administered 2019-04-27: 10 mg via INTRAVENOUS

## 2019-04-27 MED ORDER — DEXAMETHASONE SODIUM PHOSPHATE 10 MG/ML IJ SOLN
INTRAMUSCULAR | Status: AC
  Filled 2019-04-27: qty 1

## 2019-04-27 MED ORDER — DIPHENHYDRAMINE HCL 50 MG/ML IJ SOLN
25.0000 mg | Freq: Once | INTRAMUSCULAR | Status: AC
  Administered 2019-04-27: 25 mg via INTRAVENOUS

## 2019-04-27 MED ORDER — SODIUM CHLORIDE 0.9 % IV SOLN
Freq: Once | INTRAVENOUS | Status: AC
  Administered 2019-04-27: 10:00:00 via INTRAVENOUS
  Filled 2019-04-27: qty 250

## 2019-04-27 NOTE — Progress Notes (Signed)
VO from Dr. Lindi Adie, okay to treat patient today with ANC of 1.4

## 2019-04-27 NOTE — Patient Instructions (Signed)
[facility] Discharge Instructions for Patients Receiving Chemotherapy  Today you received the following chemotherapy agents Paclitaxel (TAXOL).  To help prevent nausea and vomiting after your treatment, we encourage you to take your nausea medication as prescribed.  If you develop nausea and vomiting that is not controlled by your nausea medication, call the clinic.   BELOW ARE SYMPTOMS THAT SHOULD BE REPORTED IMMEDIATELY:  *FEVER GREATER THAN 100.5 F  *CHILLS WITH OR WITHOUT FEVER  NAUSEA AND VOMITING THAT IS NOT CONTROLLED WITH YOUR NAUSEA MEDICATION  *UNUSUAL SHORTNESS OF BREATH  *UNUSUAL BRUISING OR BLEEDING  TENDERNESS IN MOUTH AND THROAT WITH OR WITHOUT PRESENCE OF ULCERS  *URINARY PROBLEMS  *BOWEL PROBLEMS  UNUSUAL RASH Items with * indicate a potential emergency and should be followed up as soon as possible.  Feel free to call the clinic should you have any questions or concerns. The clinic phone number is [phone].  Please show the CHEMO ALERT CARD at check-in to the Emergency Department and triage nurse.  Coronavirus (COVID-19) Are you at risk?  Are you at risk for the Coronavirus (COVID-19)?  To be considered HIGH RISK for Coronavirus (COVID-19), you have to meet the following criteria:  . Traveled to [place], [place], [place], [place] or [place]; or in the [place] to [place], [place], [place], or [place]; and have fever, cough, and shortness of breath within the last 2 weeks of travel OR . Been in close contact with a person diagnosed with COVID-19 within the last 2 weeks and have fever, cough, and shortness of breath . IF YOU DO NOT MEET THESE CRITERIA, YOU ARE CONSIDERED LOW RISK FOR COVID-19.  What to do if you are HIGH RISK for COVID-19?  . If you are having a medical emergency, call 911. . Seek medical care right away. Before you go to a doctor's office, urgent care or emergency department, call ahead and tell them about  your recent travel, contact with someone diagnosed with COVID-19, and your symptoms. You should receive instructions from your physician's office regarding next steps of care.  . When you arrive at healthcare provider, tell the healthcare staff immediately you have returned from visiting [place], [place], [place], [place] or [place]; or traveled in the [place] to [place], [place], [place], or [place]; in the last two weeks or you have been in close contact with a person diagnosed with COVID-19 in the last 2 weeks.   . Tell the health care staff about your symptoms: fever, cough and shortness of breath. . After you have been seen by a medical provider, you will be either: o Tested for (COVID-19) and discharged home on quarantine except to seek medical care if symptoms worsen, and asked to  - Stay home and avoid contact with others until you get your results (4-5 days)  - Avoid travel on public transportation if possible (such as bus, train, or airplane) or o Sent to the Emergency Department by EMS for evaluation, COVID-19 testing, and possible admission depending on your condition and test results.  What to do if you are LOW RISK for COVID-19?  Reduce your risk of any infection by using the same precautions used for avoiding the common cold or flu:  . Wash your hands often with soap and warm water for at least 20 seconds.  If soap and water are not readily available, use an alcohol-based hand sanitizer with at least 60% alcohol.  . If coughing or sneezing,   cover your mouth and nose by coughing or sneezing into the elbow areas of your shirt or coat, into a tissue or into your sleeve (not your hands). . Avoid shaking hands with others and consider head nods or verbal greetings only. . Avoid touching your eyes, nose, or mouth with unwashed hands.  . Avoid close contact with people who are sick. . Avoid places or events with large numbers of people in one location, like concerts or sporting  events. . Carefully consider travel plans you have or are making. . If you are planning any travel outside or inside the [place], visit the CDC's Travelers' Health webpage for the latest health notices. . If you have some symptoms but not all symptoms, continue to monitor at home and seek medical attention if your symptoms worsen. . If you are having a medical emergency, call 911.   ADDITIONAL HEALTHCARE OPTIONS FOR PATIENTS  [facility] [facility] / e-Visit: [email or web address]         [facility]: [phone]  [facility]: [phone]                   [facility]: [phone]     

## 2019-05-03 ENCOUNTER — Encounter: Payer: Self-pay | Admitting: *Deleted

## 2019-05-03 ENCOUNTER — Other Ambulatory Visit: Payer: Self-pay | Admitting: *Deleted

## 2019-05-03 DIAGNOSIS — Z170 Estrogen receptor positive status [ER+]: Secondary | ICD-10-CM

## 2019-05-03 DIAGNOSIS — C50011 Malignant neoplasm of nipple and areola, right female breast: Secondary | ICD-10-CM

## 2019-05-04 ENCOUNTER — Inpatient Hospital Stay: Payer: BC Managed Care – PPO

## 2019-05-04 ENCOUNTER — Other Ambulatory Visit: Payer: Self-pay

## 2019-05-04 VITALS — BP 94/77 | HR 77 | Temp 98.7°F | Resp 18 | Ht 61.0 in | Wt 112.2 lb

## 2019-05-04 DIAGNOSIS — Z95828 Presence of other vascular implants and grafts: Secondary | ICD-10-CM

## 2019-05-04 DIAGNOSIS — C50011 Malignant neoplasm of nipple and areola, right female breast: Secondary | ICD-10-CM

## 2019-05-04 DIAGNOSIS — Z170 Estrogen receptor positive status [ER+]: Secondary | ICD-10-CM

## 2019-05-04 LAB — CMP (CANCER CENTER ONLY)
ALT: 94 U/L — ABNORMAL HIGH (ref 0–44)
AST: 66 U/L — ABNORMAL HIGH (ref 15–41)
Albumin: 3.9 g/dL (ref 3.5–5.0)
Alkaline Phosphatase: 125 U/L (ref 38–126)
Anion gap: 9 (ref 5–15)
BUN: 11 mg/dL (ref 6–20)
CO2: 24 mmol/L (ref 22–32)
Calcium: 9.1 mg/dL (ref 8.9–10.3)
Chloride: 105 mmol/L (ref 98–111)
Creatinine: 0.66 mg/dL (ref 0.44–1.00)
GFR, Est AFR Am: >60 mL/min (ref >60)
GFR, Estimated: >60 mL/min (ref >60)
Glucose, Bld: 103 mg/dL — ABNORMAL HIGH (ref 70–99)
Potassium: 3.7 mmol/L (ref 3.5–5.1)
Sodium: 138 mmol/L (ref 135–145)
Total Bilirubin: 0.3 mg/dL (ref 0.3–1.2)
Total Protein: 7.2 g/dL (ref 6.5–8.1)

## 2019-05-04 LAB — CBC WITH DIFFERENTIAL (CANCER CENTER ONLY)
Abs Immature Granulocytes: 0.01 10*3/uL (ref 0.00–0.07)
Basophils Absolute: 0 10*3/uL (ref 0.0–0.1)
Basophils Relative: 1 %
Eosinophils Absolute: 0.1 10*3/uL (ref 0.0–0.5)
Eosinophils Relative: 4 %
HCT: 34.8 % — ABNORMAL LOW (ref 36.0–46.0)
Hemoglobin: 10.9 g/dL — ABNORMAL LOW (ref 12.0–15.0)
Immature Granulocytes: 0 %
Lymphocytes Relative: 37 %
Lymphs Abs: 0.8 10*3/uL (ref 0.7–4.0)
MCH: 28.2 pg (ref 26.0–34.0)
MCHC: 31.3 g/dL (ref 30.0–36.0)
MCV: 89.9 fL (ref 80.0–100.0)
Monocytes Absolute: 0.2 10*3/uL (ref 0.1–1.0)
Monocytes Relative: 10 %
Neutro Abs: 1.1 10*3/uL — ABNORMAL LOW (ref 1.7–7.7)
Neutrophils Relative %: 48 %
Platelet Count: 190 10*3/uL (ref 150–400)
RBC: 3.87 MIL/uL (ref 3.87–5.11)
RDW: 15.6 % — ABNORMAL HIGH (ref 11.5–15.5)
WBC Count: 2.2 10*3/uL — ABNORMAL LOW (ref 4.0–10.5)
nRBC: 0 % (ref 0.0–0.2)

## 2019-05-04 MED ORDER — DIPHENHYDRAMINE HCL 50 MG/ML IJ SOLN
INTRAMUSCULAR | Status: AC
  Filled 2019-05-04: qty 1

## 2019-05-04 MED ORDER — SODIUM CHLORIDE 0.9 % IV SOLN
Freq: Once | INTRAVENOUS | Status: AC
  Administered 2019-05-04: 10:00:00 via INTRAVENOUS
  Filled 2019-05-04: qty 250

## 2019-05-04 MED ORDER — FAMOTIDINE IN NACL 20-0.9 MG/50ML-% IV SOLN
INTRAVENOUS | Status: AC
  Filled 2019-05-04: qty 50

## 2019-05-04 MED ORDER — DEXAMETHASONE SODIUM PHOSPHATE 10 MG/ML IJ SOLN
10.0000 mg | Freq: Once | INTRAMUSCULAR | Status: AC
  Administered 2019-05-04: 10 mg via INTRAVENOUS

## 2019-05-04 MED ORDER — SODIUM CHLORIDE 0.9% FLUSH
10.0000 mL | INTRAVENOUS | Status: DC | PRN
  Administered 2019-05-04: 10 mL
  Filled 2019-05-04: qty 10

## 2019-05-04 MED ORDER — HEPARIN SOD (PORK) LOCK FLUSH 100 UNIT/ML IV SOLN
500.0000 [IU] | Freq: Once | INTRAVENOUS | Status: AC | PRN
  Administered 2019-05-04: 500 [IU]
  Filled 2019-05-04: qty 5

## 2019-05-04 MED ORDER — SODIUM CHLORIDE 0.9 % IV SOLN
45.0000 mg/m2 | Freq: Once | INTRAVENOUS | Status: AC
  Administered 2019-05-04: 66 mg via INTRAVENOUS
  Filled 2019-05-04: qty 11

## 2019-05-04 MED ORDER — FAMOTIDINE IN NACL 20-0.9 MG/50ML-% IV SOLN
20.0000 mg | Freq: Once | INTRAVENOUS | Status: AC
  Administered 2019-05-04: 20 mg via INTRAVENOUS

## 2019-05-04 MED ORDER — DEXAMETHASONE SODIUM PHOSPHATE 10 MG/ML IJ SOLN
INTRAMUSCULAR | Status: AC
  Filled 2019-05-04: qty 1

## 2019-05-04 MED ORDER — DIPHENHYDRAMINE HCL 50 MG/ML IJ SOLN
25.0000 mg | Freq: Once | INTRAMUSCULAR | Status: AC
  Administered 2019-05-04: 25 mg via INTRAVENOUS

## 2019-05-04 NOTE — Assessment & Plan Note (Signed)
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 Adjuvant radiation therapy because of the tumor size being large 3. Followed by adjuvant antiestrogen therapy with tamoxifen 20 mg daily x10 years ------------------------------------------------------------------------------------------------------------------- Current Treatment:Completed 4 cycles ofAdriamycin and Cytoxan, todayiscycle10Taxol ECHO feb 2020: EF 60-65% Labs reviewed  Chemo toxicities: 1.Nausea and vomiting: Resolved  2.Headache:Resolved 3.Fatigue: managing well 4. Muscle aches and pains 5. Blurred vision, likely due to steroids, reduced premed Dex dosage.  6.  Chemo-induced peripheral neuropathy: I reduced the dosage of her chemotherapy today. 7.  Decreased neutrophil count: ANC 1.4.  Okay to treat.   Return to clinicweekly for Taxol and in 2 weeks for follow-up with me for her last chemo. After that she undergoes adjuvant radiation.

## 2019-05-04 NOTE — Progress Notes (Signed)
Okay to treat with Cycle 11 of Taxol with ANC of 1.1, and AST and ALT levels per Dr. Lindi Adie

## 2019-05-04 NOTE — Patient Instructions (Signed)
[facility] Discharge Instructions for Patients Receiving Chemotherapy  Today you received the following chemotherapy agents: Paclitaxel (Taxol)  To help prevent nausea and vomiting after your treatment, we encourage you to take your nausea medication as directed.    If you develop nausea and vomiting that is not controlled by your nausea medication, call the clinic.   BELOW ARE SYMPTOMS THAT SHOULD BE REPORTED IMMEDIATELY:  *FEVER GREATER THAN 100.5 F  *CHILLS WITH OR WITHOUT FEVER  NAUSEA AND VOMITING THAT IS NOT CONTROLLED WITH YOUR NAUSEA MEDICATION  *UNUSUAL SHORTNESS OF BREATH  *UNUSUAL BRUISING OR BLEEDING  TENDERNESS IN MOUTH AND THROAT WITH OR WITHOUT PRESENCE OF ULCERS  *URINARY PROBLEMS  *BOWEL PROBLEMS  UNUSUAL RASH Items with * indicate a potential emergency and should be followed up as soon as possible.  Feel free to call the clinic should you have any questions or concerns. The clinic phone number is [phone].  Please show the CHEMO ALERT CARD at check-in to the Emergency Department and triage nurse.  Coronavirus (COVID-19) Are you at risk?  Are you at risk for the Coronavirus (COVID-19)?  To be considered HIGH RISK for Coronavirus (COVID-19), you have to meet the following criteria:  . Traveled to [place], [place], [place], [place] or [place]; or in the [place] to [place], [place], [place], or [place]; and have fever, cough, and shortness of breath within the last 2 weeks of travel OR . Been in close contact with a person diagnosed with COVID-19 within the last 2 weeks and have fever, cough, and shortness of breath . IF YOU DO NOT MEET THESE CRITERIA, YOU ARE CONSIDERED LOW RISK FOR COVID-19.  What to do if you are HIGH RISK for COVID-19?  . If you are having a medical emergency, call 911. . Seek medical care right away. Before you go to a doctor's office, urgent care or emergency department, call ahead and tell them about  your recent travel, contact with someone diagnosed with COVID-19, and your symptoms. You should receive instructions from your physician's office regarding next steps of care.  . When you arrive at healthcare provider, tell the healthcare staff immediately you have returned from visiting [place], [place], [place], [place] or [place]; or traveled in the [place] to [place], [place], [place], or [place]; in the last two weeks or you have been in close contact with a person diagnosed with COVID-19 in the last 2 weeks.   . Tell the health care staff about your symptoms: fever, cough and shortness of breath. . After you have been seen by a medical provider, you will be either: o Tested for (COVID-19) and discharged home on quarantine except to seek medical care if symptoms worsen, and asked to  - Stay home and avoid contact with others until you get your results (4-5 days)  - Avoid travel on public transportation if possible (such as bus, train, or airplane) or o Sent to the Emergency Department by EMS for evaluation, COVID-19 testing, and possible admission depending on your condition and test results.  What to do if you are LOW RISK for COVID-19?  Reduce your risk of any infection by using the same precautions used for avoiding the common cold or flu:  . Wash your hands often with soap and warm water for at least 20 seconds.  If soap and water are not readily available, use an alcohol-based hand sanitizer with at least 60% alcohol.  . If coughing   or sneezing, cover your mouth and nose by coughing or sneezing into the elbow areas of your shirt or coat, into a tissue or into your sleeve (not your hands). . Avoid shaking hands with others and consider head nods or verbal greetings only. . Avoid touching your eyes, nose, or mouth with unwashed hands.  . Avoid close contact with people who are sick. . Avoid places or events with large numbers of people in one location, like concerts or sporting  events. . Carefully consider travel plans you have or are making. . If you are planning any travel outside or inside the [place], visit the CDC's Travelers' Health webpage for the latest health notices. . If you have some symptoms but not all symptoms, continue to monitor at home and seek medical attention if your symptoms worsen. . If you are having a medical emergency, call 911.   ADDITIONAL HEALTHCARE OPTIONS FOR PATIENTS  [place] [facility] / e-Visit: [email or web address]         [facility]: [phone]   Urgent Care: [phone]                   [facility]: [phone]   

## 2019-05-07 ENCOUNTER — Telehealth: Payer: Self-pay | Admitting: Radiation Oncology

## 2019-05-07 NOTE — Telephone Encounter (Signed)
New Message:     LVM with interpreter for patient to call back to schedule from referral received.

## 2019-05-09 NOTE — Progress Notes (Signed)
Patient Care Team: Newton Pigg, MD as PCP - General (Obstetrics and Gynecology)  DIAGNOSIS:    ICD-10-CM   1. 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, estrogen receptor positive ([place])  09/30/2018 Surgery   Right mastectomy with axillary lymph node dissection in [place]: Grade 2 IDC, 5.3 cm, DCIS, margins negative, lymphovascular invasion present, 0/18 lymph nodes, ER 60%, PR 0%, HER-2 negative, Ki-67 90%, T3N0 stage II B   11/15/2018 Cancer Staging   Staging form: Breast, AJCC 8th Edition - Pathologic: Stage IIB (pT3, pN0, cM0, G2, ER+, PR-, HER2-) - Signed by Nicholas Lose, MD on 11/15/2018   11/29/2018 Oncotype testing   Oncotype DX score 28: 17% risk of distant recurrence at 9 years   12/08/2018 Genetic Testing   Negative genetic testing on the common hereditary cancer panel.  The Hereditary Gene Panel offered by [facility] includes sequencing and/or deletion duplication testing of the following 47 genes: APC, ATM, AXIN2, BARD1, BMPR1A, BRCA1, BRCA2, BRIP1, CDH1, CDK4, CDKN2A (p14ARF), CDKN2A (p16INK4a), CHEK2, CTNNA1, DICER1, EPCAM (Deletion/duplication testing only), GREM1 (promoter region deletion/duplication testing only), KIT, MEN1, MLH1, MSH2, MSH3, MSH6, MUTYH, NBN, NF1, NHTL1, PALB2, PDGFRA, PMS2, POLD1, POLE, PTEN, RAD50, RAD51C, RAD51D, SDHB, SDHC, SDHD, SMAD4, SMARCA4. STK11, TP53, TSC1, TSC2, and VHL.  The following genes were evaluated for sequence changes only: SDHA and HOXB13 c.251G>A variant only. The report date is December 08, 2018.   12/29/2018 -  Chemotherapy   Adjuvant chemotherapy with dose dense Adriamycin and Cytoxan followed by Taxol      CHIEF COMPLIANT: Cycle 12 Taxol  INTERVAL HISTORY: Ariana Luna is a 43 y.o. with above-mentioned  history of right breast cancer treated with right mastectomy in [place], and whocompleted 4 cycles ofadjuvant chemotherapy withdose denseAdriamycinand Cytoxan. Shepresents to the clinic todayfor cycle12of weekly Taxol.  REVIEW OF SYSTEMS:   Constitutional: Denies fevers, chills or abnormal weight loss Eyes: Denies blurriness of vision Ears, nose, mouth, throat, and face: Denies mucositis or sore throat Respiratory: Denies cough, dyspnea or wheezes Cardiovascular: Denies palpitation, chest discomfort Gastrointestinal: Denies nausea, heartburn or change in bowel habits Skin: Denies abnormal skin rashes Lymphatics: Denies new lymphadenopathy or easy bruising Neurological: Denies numbness, tingling or new weaknesses Behavioral/Psych: Mood is stable, no new changes  Extremities: No lower extremity edema Breast: denies any pain or lumps or nodules in either breasts All other systems were reviewed with the patient and are negative.  I have reviewed the past medical history, past surgical history, social history and family history with the patient and they are unchanged from previous note.  ALLERGIES:  has No Known Allergies.  MEDICATIONS:  Current Outpatient Medications  Medication Sig Dispense Refill   amoxicillin (AMOXIL) 500 MG capsule Take 1 capsule (500 mg total) by mouth 2 (two) times daily. 14 capsule 0   lidocaine-prilocaine (EMLA) cream Apply 1 application topically as needed. 30 g 0   lidocaine-prilocaine (EMLA) cream Apply to affected area once 30 g 3   LORazepam (ATIVAN) 0.5 MG tablet Take 1 tablet (0.5 mg total) by mouth at bedtime as needed for sleep. 30 tablet 0   ondansetron (ZOFRAN) 8 MG tablet Take 1 tablet (8 mg total) by mouth 2 (two) times daily as needed. Start on the third day after chemotherapy. 30 tablet 1  prochlorperazine (COMPAZINE) 10 MG tablet Take 1 tablet (10 mg total) by mouth every 6 (six) hours as needed (Nausea or vomiting). 30 tablet 1    No current facility-administered medications for this visit.     PHYSICAL EXAMINATION: ECOG PERFORMANCE STATUS: 1 - Symptomatic but completely ambulatory  Vitals:   05/10/19 1403  BP: 91/69  Pulse: 94  Resp: 17  Temp: 98.9 F (37.2 C)  SpO2: 100%   Filed Weights   05/10/19 1403  Weight: 112 lb 9.6 oz (51.1 kg)    GENERAL: alert, no distress and comfortable SKIN: skin color, texture, turgor are normal, no rashes or significant lesions EYES: normal, Conjunctiva are pink and non-injected, sclera clear OROPHARYNX: no exudate, no erythema and lips, buccal mucosa, and tongue normal  NECK: supple, thyroid normal size, non-tender, without nodularity LYMPH: no palpable lymphadenopathy in the cervical, axillary or inguinal LUNGS: clear to auscultation and percussion with normal breathing effort HEART: regular rate & rhythm and no murmurs and no lower extremity edema ABDOMEN: abdomen soft, non-tender and normal bowel sounds MUSCULOSKELETAL: no cyanosis of digits and no clubbing  NEURO: alert & oriented x 3 with fluent speech, no focal motor/sensory deficits EXTREMITIES: No lower extremity edema  LABORATORY DATA:  I have reviewed the data as listed CMP Latest Ref Rng & Units 05/04/2019 04/27/2019 04/20/2019  Glucose 70 - 99 mg/dL 103(H) 93 102(H)  BUN 6 - 20 mg/dL 11 15 6   Creatinine 0.44 - 1.00 mg/dL 0.66 0.67 0.64  Sodium 135 - 145 mmol/L 138 139 141  Potassium 3.5 - 5.1 mmol/L 3.7 3.8 3.7  Chloride 98 - 111 mmol/L 105 105 108  CO2 22 - 32 mmol/L 24 23 24   Calcium 8.9 - 10.3 mg/dL 9.1 9.2 9.1  Total Protein 6.5 - 8.1 g/dL 7.2 7.7 7.2  Total Bilirubin 0.3 - 1.2 mg/dL 0.3 0.3 <0.2(L)  Alkaline Phos 38 - 126 U/L 125 123 120  AST 15 - 41 U/L 66(H) 68(H) 47(H)  ALT 0 - 44 U/L 94(H) 71(H) 41    Lab Results  Component Value Date   WBC 2.2 (L) 05/04/2019   HGB 10.9 (L) 05/04/2019   HCT 34.8 (L) 05/04/2019   MCV 89.9 05/04/2019   PLT 190 05/04/2019   NEUTROABS 1.1 (L)  05/04/2019    ASSESSMENT & PLAN:  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 Adjuvant radiation therapy because of the tumor size being large 3. Followed by adjuvant antiestrogen therapy with tamoxifen 20 mg daily x10 years ------------------------------------------------------------------------------------------------------------------- Current Treatment:Completed 4 cycles ofAdriamycin and Cytoxan, todayiscycle12Taxol ECHO feb 2020: EF 60-65% Labs reviewed  Chemo toxicities: 1.Nausea and vomiting: Resolved  2.Headache:Resolved 3.Fatigue: managing well 4. Muscle aches and pains 5. Blurred vision, likely due to steroids, reduced premed Dex dosage.  6.  Chemo-induced peripheral neuropathy: I reduced the dosage of her chemotherapy today. 7.  Decreased neutrophil count: ANC 1.4.  Okay to treat.  This completes her chemotherapy. Radiation referral has been made. I sent a request interventional radiology to remove her port. I will see her back at the end of radiation to start antiestrogen therapy with tamoxifen.  No orders of the defined types were placed in this encounter.  The patient has a good understanding of the overall plan. she agrees with it. she will call with any problems that may develop before the next visit here.  Nicholas Lose, MD 05/10/2019  Julious Oka Dorshimer am acting as scribe for Dr. Nicholas Lose.  I have reviewed the above documentation for accuracy and completeness, and I agree with the above.

## 2019-05-10 ENCOUNTER — Inpatient Hospital Stay: Payer: BC Managed Care – PPO

## 2019-05-10 ENCOUNTER — Other Ambulatory Visit: Payer: Self-pay

## 2019-05-10 ENCOUNTER — Inpatient Hospital Stay (HOSPITAL_BASED_OUTPATIENT_CLINIC_OR_DEPARTMENT_OTHER): Payer: BC Managed Care – PPO | Admitting: Hematology and Oncology

## 2019-05-10 ENCOUNTER — Inpatient Hospital Stay: Payer: BC Managed Care – PPO | Attending: Hematology and Oncology

## 2019-05-10 DIAGNOSIS — Z170 Estrogen receptor positive status [ER+]: Secondary | ICD-10-CM

## 2019-05-10 DIAGNOSIS — G620 Drug-induced polyneuropathy: Secondary | ICD-10-CM

## 2019-05-10 DIAGNOSIS — Z79899 Other long term (current) drug therapy: Secondary | ICD-10-CM | POA: Insufficient documentation

## 2019-05-10 DIAGNOSIS — C50011 Malignant neoplasm of nipple and areola, right female breast: Secondary | ICD-10-CM

## 2019-05-10 DIAGNOSIS — T451X5A Adverse effect of antineoplastic and immunosuppressive drugs, initial encounter: Secondary | ICD-10-CM

## 2019-05-10 DIAGNOSIS — Z79810 Long term (current) use of selective estrogen receptor modulators (SERMs): Secondary | ICD-10-CM

## 2019-05-10 DIAGNOSIS — R5383 Other fatigue: Secondary | ICD-10-CM | POA: Diagnosis not present

## 2019-05-10 DIAGNOSIS — Z9011 Acquired absence of right breast and nipple: Secondary | ICD-10-CM

## 2019-05-10 DIAGNOSIS — Z95828 Presence of other vascular implants and grafts: Secondary | ICD-10-CM

## 2019-05-10 DIAGNOSIS — Z5111 Encounter for antineoplastic chemotherapy: Secondary | ICD-10-CM | POA: Diagnosis not present

## 2019-05-10 DIAGNOSIS — M791 Myalgia, unspecified site: Secondary | ICD-10-CM | POA: Diagnosis not present

## 2019-05-10 LAB — CBC WITH DIFFERENTIAL (CANCER CENTER ONLY)
Abs Immature Granulocytes: 0 10*3/uL (ref 0.00–0.07)
Basophils Absolute: 0 10*3/uL (ref 0.0–0.1)
Basophils Relative: 1 %
Eosinophils Absolute: 0.1 10*3/uL (ref 0.0–0.5)
Eosinophils Relative: 2 %
HCT: 36.1 % (ref 36.0–46.0)
Hemoglobin: 11.6 g/dL — ABNORMAL LOW (ref 12.0–15.0)
Immature Granulocytes: 0 %
Lymphocytes Relative: 30 %
Lymphs Abs: 0.9 10*3/uL (ref 0.7–4.0)
MCH: 28.6 pg (ref 26.0–34.0)
MCHC: 32.1 g/dL (ref 30.0–36.0)
MCV: 89.1 fL (ref 80.0–100.0)
Monocytes Absolute: 0.3 10*3/uL (ref 0.1–1.0)
Monocytes Relative: 9 %
Neutro Abs: 1.7 10*3/uL (ref 1.7–7.7)
Neutrophils Relative %: 58 %
Platelet Count: 202 10*3/uL (ref 150–400)
RBC: 4.05 MIL/uL (ref 3.87–5.11)
RDW: 14.8 % (ref 11.5–15.5)
WBC Count: 2.9 10*3/uL — ABNORMAL LOW (ref 4.0–10.5)
nRBC: 0 % (ref 0.0–0.2)

## 2019-05-10 LAB — CMP (CANCER CENTER ONLY)
ALT: 75 U/L — ABNORMAL HIGH (ref 0–44)
AST: 53 U/L — ABNORMAL HIGH (ref 15–41)
Albumin: 4 g/dL (ref 3.5–5.0)
Alkaline Phosphatase: 115 U/L (ref 38–126)
Anion gap: 10 (ref 5–15)
BUN: 14 mg/dL (ref 6–20)
CO2: 26 mmol/L (ref 22–32)
Calcium: 9.1 mg/dL (ref 8.9–10.3)
Chloride: 103 mmol/L (ref 98–111)
Creatinine: 0.64 mg/dL (ref 0.44–1.00)
GFR, Est AFR Am: >60 mL/min (ref >60)
GFR, Estimated: >60 mL/min (ref >60)
Glucose, Bld: 103 mg/dL — ABNORMAL HIGH (ref 70–99)
Potassium: 3.9 mmol/L (ref 3.5–5.1)
Sodium: 139 mmol/L (ref 135–145)
Total Bilirubin: 0.4 mg/dL (ref 0.3–1.2)
Total Protein: 7.3 g/dL (ref 6.5–8.1)

## 2019-05-10 MED ORDER — SODIUM CHLORIDE 0.9 % IV SOLN
Freq: Once | INTRAVENOUS | Status: AC
  Administered 2019-05-10: 15:00:00 via INTRAVENOUS
  Filled 2019-05-10: qty 250

## 2019-05-10 MED ORDER — DIPHENHYDRAMINE HCL 50 MG/ML IJ SOLN
25.0000 mg | Freq: Once | INTRAMUSCULAR | Status: AC
  Administered 2019-05-10: 25 mg via INTRAVENOUS

## 2019-05-10 MED ORDER — DEXAMETHASONE SODIUM PHOSPHATE 10 MG/ML IJ SOLN
10.0000 mg | Freq: Once | INTRAMUSCULAR | Status: AC
  Administered 2019-05-10: 10 mg via INTRAVENOUS

## 2019-05-10 MED ORDER — FAMOTIDINE IN NACL 20-0.9 MG/50ML-% IV SOLN
20.0000 mg | Freq: Once | INTRAVENOUS | Status: AC
  Administered 2019-05-10: 20 mg via INTRAVENOUS

## 2019-05-10 MED ORDER — SODIUM CHLORIDE 0.9% FLUSH
10.0000 mL | INTRAVENOUS | Status: DC | PRN
  Administered 2019-05-10: 10 mL
  Filled 2019-05-10: qty 10

## 2019-05-10 MED ORDER — HEPARIN SOD (PORK) LOCK FLUSH 100 UNIT/ML IV SOLN
500.0000 [IU] | Freq: Once | INTRAVENOUS | Status: AC | PRN
  Administered 2019-05-10: 500 [IU]
  Filled 2019-05-10: qty 5

## 2019-05-10 MED ORDER — DIPHENHYDRAMINE HCL 50 MG/ML IJ SOLN
INTRAMUSCULAR | Status: AC
  Filled 2019-05-10: qty 1

## 2019-05-10 MED ORDER — SODIUM CHLORIDE 0.9 % IV SOLN
45.0000 mg/m2 | Freq: Once | INTRAVENOUS | Status: AC
  Administered 2019-05-10: 16:00:00 66 mg via INTRAVENOUS
  Filled 2019-05-10: qty 11

## 2019-05-10 MED ORDER — DEXAMETHASONE SODIUM PHOSPHATE 10 MG/ML IJ SOLN
INTRAMUSCULAR | Status: AC
  Filled 2019-05-10: qty 1

## 2019-05-10 MED ORDER — FAMOTIDINE IN NACL 20-0.9 MG/50ML-% IV SOLN
INTRAVENOUS | Status: AC
  Filled 2019-05-10: qty 50

## 2019-05-15 ENCOUNTER — Telehealth: Payer: Self-pay | Admitting: Radiation Oncology

## 2019-05-15 NOTE — Progress Notes (Signed)
Location of Breast Cancer: Right Breast  Histology per Pathology Report:  09/30/2018:Right mastectomy with axillary lymph node dissection in [place]: Grade 2 IDC, 5.3 cm, DCIS, margins negative, lymphovascular invasion present, 0/18 lymph nodes, ER 60%, PR 0%, HER-2 negative, Ki-67 90%, T3N0 stage II B   Did patient present with symptoms or was this found on screening mammography?:   Past/Anticipated interventions by surgeon, if any: Right Breast mastectomy with axillary lymph node dissection in [place] on 09/30/2018.  Past/Anticipated interventions by medical oncology, if any:  05/10/19 Dr. Lindi Adie Current Treatment:Completed 4 cycles ofAdriamycin and Cytoxan, todayiscycle12Taxol ECHO feb 2020: EF 60-65% Labs reviewed  Chemo toxicities: 1.Nausea and vomiting: Resolved  2.Headache:Resolved 3.Fatigue: managing well 4. Muscle aches and pains 5. Blurred vision, likely due to steroids, reduced premed Dex dosage. 6.Chemo-induced peripheral neuropathy: I reduced the dosage of her chemotherapy today. 7.Decreased neutrophil count: ANC 1.4. Okay to treat.  This completes her chemotherapy. Radiation referral has been made. I sent a request interventional radiology to remove her port. I will see her back at the end of radiation to start antiestrogen therapy with tamoxifen.   Lymphedema issues, if any:  She denies. She is able to move her arm freely.   Pain issues, if any:  No   SAFETY ISSUES:  Prior radiation? No  Pacemaker/ICD? No  Possible current pregnancy? She denies.   Is the patient on methotrexate? No  Current Complaints / other details:        Ariana Luna, Stephani Police, RN 05/15/2019,1:26 PM

## 2019-05-16 ENCOUNTER — Encounter: Payer: Self-pay | Admitting: Radiation Oncology

## 2019-05-16 ENCOUNTER — Ambulatory Visit
Admission: RE | Admit: 2019-05-16 | Discharge: 2019-05-16 | Disposition: A | Discharge status: Home / Self Care | Payer: BC Managed Care – PPO | Source: Ambulatory Visit | Attending: Radiation Oncology | Admitting: Radiation Oncology

## 2019-05-16 ENCOUNTER — Other Ambulatory Visit: Payer: Self-pay

## 2019-05-16 DIAGNOSIS — Z9221 Personal history of antineoplastic chemotherapy: Secondary | ICD-10-CM | POA: Diagnosis not present

## 2019-05-16 DIAGNOSIS — Z170 Estrogen receptor positive status [ER+]: Secondary | ICD-10-CM | POA: Insufficient documentation

## 2019-05-16 DIAGNOSIS — Z79899 Other long term (current) drug therapy: Secondary | ICD-10-CM | POA: Insufficient documentation

## 2019-05-16 DIAGNOSIS — Z510 Encounter for antineoplastic radiation therapy: Secondary | ICD-10-CM | POA: Insufficient documentation

## 2019-05-16 DIAGNOSIS — C50011 Malignant neoplasm of nipple and areola, right female breast: Secondary | ICD-10-CM | POA: Insufficient documentation

## 2019-05-16 DIAGNOSIS — Z9011 Acquired absence of right breast and nipple: Secondary | ICD-10-CM | POA: Diagnosis not present

## 2019-05-16 NOTE — Progress Notes (Addendum)
Radiation Oncology         [phone] ________________________________  Initial outpatient Consultation  Name: Ariana Luna MRN: [id number]  Date: 05/16/2019  DOB: 06/28/1976  CH:YIFOYD, Marcello Moores, MD  Nicholas Lose, MD   REFERRING PHYSICIAN: Nicholas Lose, MD  DIAGNOSIS:    ICD-10-CM   1. 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 female who is present in clinic today with the help of a translator by speaker phone.   Her oncology history is below.  Briefly, the patient underwent surgery in November 2019 [place] for a large right breast mass that measured 5.3 cm on pathology.  According to our pathologist's assessment, invasive carcinoma was broadly present at the superior margin and focally 0.1 cm to the anterior margin.  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  18 lymph nodes were all negative.  There was lymphovascular invasion identified which was diffuse in the tumor specimen.  The tumor was grade 3 and the DCIS was low-grade.  The patient received chemotherapy under the care of Dr. Lindi Adie.  Please see details below.  She finished her chemotherapy 6 days ago.  She now is being seen in my clinic to discuss adjuvant radiotherapy.  She denies lymphedema or issues with arm mobility. Oncology History  Malignant neoplasm involving both nipple and areola of right  breast in female, estrogen receptor positive ([place])  09/30/2018 Surgery   Right mastectomy with axillary lymph node dissection in [place]: Grade 2 IDC, 5.3 cm, DCIS, margins negative, lymphovascular invasion present, 0/18 lymph nodes, ER 60%, PR 0%, HER-2 negative, Ki-67 90%, T3N0 stage II B   11/15/2018 Cancer Staging   Staging form: Breast, AJCC 8th Edition - Pathologic: Stage IIB (pT3, pN0, cM0, G2, ER+, PR-, HER2-) - Signed by Nicholas Lose, MD on 11/15/2018   11/29/2018 Oncotype testing   Oncotype DX score 28: 17% risk of distant recurrence at 9 years   12/08/2018 Genetic Testing   Negative genetic testing on the common hereditary cancer panel.  The Hereditary Gene Panel offered by [facility] includes sequencing and/or deletion duplication testing of the following 47 genes: APC, ATM, AXIN2, BARD1, BMPR1A, BRCA1, BRCA2, BRIP1, CDH1, CDK4, CDKN2A (p14ARF), CDKN2A (p16INK4a), CHEK2, CTNNA1, DICER1, EPCAM (Deletion/duplication testing only), GREM1 (promoter region deletion/duplication testing only), KIT, MEN1, MLH1, MSH2, MSH3, MSH6, MUTYH, NBN, NF1, NHTL1, PALB2, PDGFRA, PMS2, POLD1, POLE, PTEN, RAD50, RAD51C, RAD51D, SDHB, SDHC, SDHD, SMAD4, SMARCA4. STK11, TP53, TSC1, TSC2, and VHL.  The following genes were evaluated for sequence changes only: SDHA and HOXB13 c.251G>A variant only. The report date is December 08, 2018.   12/29/2018 -  Chemotherapy   Adjuvant chemotherapy with dose dense Adriamycin and Cytoxan followed by Taxol        PREVIOUS RADIATION THERAPY:  No  PAST MEDICAL HISTORY:  has a past medical history of Breast cancer ([place]) and PONV (postoperative nausea and vomiting).    PAST SURGICAL HISTORY:  Past Surgical History:  Procedure Laterality Date  . IR IMAGING GUIDED PORT INSERTION  12/28/2018  . MASTECTOMY Right     FAMILY HISTORY: family history is not on file.  SOCIAL HISTORY:  reports that she has never smoked. She has never used smokeless tobacco.  ALLERGIES: Patient has  no known allergies.  MEDICATIONS:  Current Outpatient Medications  Medication Sig Dispense Refill  . amoxicillin (AMOXIL) 500 MG capsule Take 1 capsule (500 mg total) by mouth 2 (two) times daily. 14 capsule 0  . lidocaine-prilocaine (EMLA) cream Apply 1 application topically as needed. 30 g 0  . lidocaine-prilocaine (EMLA) cream Apply to affected area once 30 g 3  . LORazepam (ATIVAN) 0.5 MG tablet Take 1 tablet (0.5 mg total) by mouth at bedtime as needed for sleep. 30 tablet 0  . ondansetron (ZOFRAN) 8 MG tablet Take 1 tablet (8 mg total) by mouth 2 (two) times daily as needed. Start on the third day after chemotherapy. 30 tablet 1  . prochlorperazine (COMPAZINE) 10 MG tablet Take 1 tablet (10 mg total) by mouth every 6 (six) hours as needed (Nausea or vomiting). 30 tablet 1   No current facility-administered medications for this encounter.     REVIEW OF SYSTEMS: As above  PHYSICAL EXAM:  weight is 114 lb (51.7 kg).   General: Alert and oriented, in no acute distress Neck: No supraclavicular adenopathy on the right  Extremities: No edema. Lymphatics: see Neck Exam and axillary below Skin: No concerning lesions. Musculoskeletal: symmetric strength and muscle tone throughout. Neurologic: Cranial nerves II through XII are grossly intact. No obvious focalities. Speech is fluent. Coordination is intact. Psychiatric: Judgment and insight are intact. Affect is appropriate. Breasts: Status post right mastectomy with no visible or palpable tumor to palpation along the right chest wall or the right axilla.    ECOG = 0  0 - Asymptomatic (Fully active, able to carry on all predisease activities without restriction)  1 - Symptomatic but completely ambulatory (Restricted in physically strenuous activity but ambulatory and able to carry out work of a light or sedentary nature. For example, light housework, office work)  2 - Symptomatic, <50% in bed during the day (Ambulatory and capable of all  self care but unable to carry out any work activities. Up and about more than 50% of waking hours)  3 - Symptomatic, >50% in bed, but not bedbound (Capable of only limited self-care, confined to bed or chair 50% or more of waking hours)  4 - Bedbound (Completely disabled. Cannot carry on any self-care. Totally confined to bed or chair)  5 - Death   Eustace Pen MM, Creech RH, Tormey DC, et al. [phone]). "Toxicity and response criteria of the [facility]". [place] Oncol. 5 (6): 649-55   LABORATORY DATA:  Lab Results  Component Value Date   WBC 2.9 (L) 05/10/2019   HGB 11.6 (L) 05/10/2019   HCT 36.1 05/10/2019   MCV 89.1 05/10/2019   PLT 202 05/10/2019   CMP     Component Value Date/Time   NA 139 05/10/2019 1337   K 3.9 05/10/2019 1337   CL 103 05/10/2019 1337   CO2 26 05/10/2019 1337   GLUCOSE 103 (H) 05/10/2019 1337   BUN 14 05/10/2019 1337   CREATININE 0.64 05/10/2019 1337   CALCIUM 9.1 05/10/2019 1337   PROT 7.3 05/10/2019 1337   ALBUMIN 4.0 05/10/2019 1337   AST 53 (H) 05/10/2019 1337   ALT 75 (H) 05/10/2019 1337  ALKPHOS 115 05/10/2019 1337   BILITOT 0.4 05/10/2019 1337   GFRNONAA >60 05/10/2019 1337   GFRAA >60 05/10/2019 1337         RADIOGRAPHY: No results found.    IMPRESSION/PLAN: Locally advanced right breast cancer status post mastectomy  I recommend radiotherapy for locoregional control based on the tumor size, the broadly present invasive carcinoma at the superior margin and additional close margins, the diffuse lymphovascular invasion, and the patient's young age.  It was a pleasure meeting the patient today. We discussed the risks, benefits, and side effects of radiotherapy. I recommend radiotherapy to the the right chest wall and regional nodes to reduce her risk of locoregional recurrence by 2/3.  We discussed that radiation would take approximately 6 weeks to complete and that I would give the patient a few weeks to heal following  surgery before starting treatment planning. We spoke about acute effects including skin irritation/breakdown and fatigue as well as much less common late effects including lymphedema of the arm, internal organ injury. We spoke about the latest technology that is used to minimize the risk of late effects for patients undergoing radiotherapy to the breast or chest wall. No guarantees of treatment were given. The patient is enthusiastic about proceeding with treatment. I look forward to participating in the patient's care.  I will order simulation to take place next week.  Consent was signed today   I spent 30 minutes  face to face with the patient and more than 50% of that time was spent in counseling and/or coordination of care.   __________________________________________   Eppie Gibson, MD

## 2019-05-21 ENCOUNTER — Encounter: Payer: Self-pay | Admitting: *Deleted

## 2019-05-22 ENCOUNTER — Ambulatory Visit
Admission: RE | Admit: 2019-05-22 | Discharge: 2019-05-22 | Disposition: A | Discharge status: Home / Self Care | Payer: BC Managed Care – PPO | Source: Ambulatory Visit | Attending: Radiation Oncology | Admitting: Radiation Oncology

## 2019-05-22 ENCOUNTER — Other Ambulatory Visit: Payer: Self-pay

## 2019-05-22 DIAGNOSIS — C50011 Malignant neoplasm of nipple and areola, right female breast: Secondary | ICD-10-CM

## 2019-05-22 DIAGNOSIS — Z170 Estrogen receptor positive status [ER+]: Secondary | ICD-10-CM | POA: Diagnosis not present

## 2019-05-22 DIAGNOSIS — Z510 Encounter for antineoplastic radiation therapy: Secondary | ICD-10-CM | POA: Diagnosis not present

## 2019-05-22 NOTE — Progress Notes (Signed)
  Radiation Oncology         [phone] ________________________________  Name: Ariana Luna MRN: [id number]  Date: 05/22/2019  DOB: 28-Jan-1976  SIMULATION AND TREATMENT PLANNING NOTE    Outpatient  DIAGNOSIS:     ICD-10-CM   1. 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  Identity was confirmed.  All relevant records and images related to the planned course of therapy were reviewed.  The patient freely provided informed written consent to proceed with treatment after reviewing the details related to the planned course of therapy. The consent form was witnessed and verified by the simulation staff.    Then, the patient was set-up in a stable reproducible supine position for radiation therapy with her ipsilateral arm over her head, and her upper body secured in a custom-made Vac-lok device.  CT images were obtained.  Surface markings were placed.  The CT images were loaded into the planning software.    TREATMENT PLANNING NOTE: Treatment planning then occurred.  The radiation prescription was entered and confirmed.     A total of 5 medically necessary complex treatment devices were fabricated and supervised by me: 4 fields with MLCs for custom blocks to protect heart, and lungs;  and, a Vac-lok. MORE COMPLEX DEVICES MAY BE MADE IN DOSIMETRY FOR FIELD IN FIELD BEAMS FOR DOSE HOMOGENEITY.  I have requested : 3D Simulation which is medically necessary to give adequate dose to at risk tissues while sparing lungs and heart.  I have requested a DVH of the following structures: lungs, heart, cord, esophagus.    The patient will receive 50 Gy in 25 fractions to the right chest wall and regional nodes with 4 fields. This will be followed by a boost.  Optical Surface Tracking Plan:  Since intensity modulated radiotherapy (IMRT) and  3D conformal radiation treatment methods are predicated on accurate and precise positioning for treatment, intrafraction motion monitoring is medically necessary to ensure accurate and safe treatment delivery. The ability to quantify intrafraction motion without excessive ionizing radiation dose can only be performed with optical surface tracking. Accordingly, surface imaging offers the opportunity to obtain 3D measurements of patient position throughout IMRT and 3D treatments without excessive radiation exposure. I am ordering optical surface tracking for this patient's upcoming course of radiotherapy.  ________________________________   Reference:  Ursula Alert, J, et al. Surface imaging-based analysis of intrafraction motion for breast radiotherapy patients.Journal of [place], n. 6, nov. 2014. ISSN [id number].  Available at: <http://www.jacmp.org/index.php/jacmp/article/view/4957>.    -----------------------------------  Eppie Gibson, MD

## 2019-05-25 DIAGNOSIS — C50011 Malignant neoplasm of nipple and areola, right female breast: Secondary | ICD-10-CM | POA: Diagnosis not present

## 2019-05-28 DIAGNOSIS — C50011 Malignant neoplasm of nipple and areola, right female breast: Secondary | ICD-10-CM | POA: Diagnosis not present

## 2019-05-30 ENCOUNTER — Other Ambulatory Visit: Payer: Self-pay

## 2019-05-30 ENCOUNTER — Ambulatory Visit
Admission: RE | Admit: 2019-05-30 | Discharge: 2019-05-30 | Disposition: A | Discharge status: Home / Self Care | Payer: BC Managed Care – PPO | Source: Ambulatory Visit | Attending: Radiation Oncology | Admitting: Radiation Oncology

## 2019-05-30 DIAGNOSIS — C50011 Malignant neoplasm of nipple and areola, right female breast: Secondary | ICD-10-CM | POA: Diagnosis not present

## 2019-05-31 ENCOUNTER — Ambulatory Visit
Admission: RE | Admit: 2019-05-31 | Discharge: 2019-05-31 | Disposition: A | Discharge status: Home / Self Care | Payer: BC Managed Care – PPO | Source: Ambulatory Visit | Attending: Radiation Oncology | Admitting: Radiation Oncology

## 2019-05-31 ENCOUNTER — Other Ambulatory Visit: Payer: Self-pay

## 2019-05-31 DIAGNOSIS — C50011 Malignant neoplasm of nipple and areola, right female breast: Secondary | ICD-10-CM | POA: Diagnosis not present

## 2019-06-01 ENCOUNTER — Other Ambulatory Visit: Payer: Self-pay

## 2019-06-01 ENCOUNTER — Ambulatory Visit
Admission: RE | Admit: 2019-06-01 | Discharge: 2019-06-01 | Disposition: A | Discharge status: Home / Self Care | Payer: BC Managed Care – PPO | Source: Ambulatory Visit | Attending: Radiation Oncology | Admitting: Radiation Oncology

## 2019-06-01 DIAGNOSIS — C50011 Malignant neoplasm of nipple and areola, right female breast: Secondary | ICD-10-CM

## 2019-06-01 MED ORDER — ALRA NON-METALLIC DEODORANT (RAD-ONC)
1.0000 "application " | Freq: Once | TOPICAL | Status: AC
  Administered 2019-06-01: 1 via TOPICAL

## 2019-06-01 MED ORDER — RADIAPLEXRX EX GEL
Freq: Once | CUTANEOUS | Status: AC
  Administered 2019-06-01: 15:00:00 via TOPICAL

## 2019-06-01 NOTE — Progress Notes (Signed)
Pt here for patient teaching.  Pt given Radiation and You booklet, skin care instructions, Alra deodorant and Radiaplex gel.  Reviewed areas of pertinence such as fatigue, skin changes, breast tenderness and breast swelling . Pt able to give teach back of to pat skin and drink plenty of water,apply Radiaplex bid, avoid applying anything to skin within 4 hours of treatment, avoid wearing an under wire bra and to use an electric razor if they must shave. Pt verbalizes understanding of information given and will contact nursing with any questions or concerns.     Http://[email or web address]/treatmentinformation/whattoexpect/index

## 2019-06-04 ENCOUNTER — Other Ambulatory Visit: Payer: Self-pay

## 2019-06-04 ENCOUNTER — Ambulatory Visit
Admission: RE | Admit: 2019-06-04 | Discharge: 2019-06-04 | Disposition: A | Discharge status: Home / Self Care | Payer: BC Managed Care – PPO | Source: Ambulatory Visit | Attending: Radiation Oncology | Admitting: Radiation Oncology

## 2019-06-04 ENCOUNTER — Other Ambulatory Visit: Payer: Self-pay | Admitting: Radiology

## 2019-06-04 ENCOUNTER — Other Ambulatory Visit: Payer: Self-pay | Admitting: Radiation Oncology

## 2019-06-04 DIAGNOSIS — Z170 Estrogen receptor positive status [ER+]: Secondary | ICD-10-CM

## 2019-06-04 DIAGNOSIS — C50011 Malignant neoplasm of nipple and areola, right female breast: Secondary | ICD-10-CM

## 2019-06-04 MED ORDER — OMEPRAZOLE 20 MG PO CPDR: DELAYED_RELEASE_CAPSULE | ORAL | 0 refills | Status: DC

## 2019-06-05 ENCOUNTER — Ambulatory Visit (HOSPITAL_COMMUNITY)
Admission: RE | Admit: 2019-06-05 | Discharge: 2019-06-05 | Disposition: A | Discharge status: Home / Self Care | Payer: BC Managed Care – PPO | Source: Ambulatory Visit | Attending: Hematology and Oncology | Admitting: Hematology and Oncology

## 2019-06-05 ENCOUNTER — Encounter (HOSPITAL_COMMUNITY): Payer: Self-pay

## 2019-06-05 ENCOUNTER — Ambulatory Visit
Admission: RE | Admit: 2019-06-05 | Discharge: 2019-06-05 | Disposition: A | Discharge status: Home / Self Care | Payer: BC Managed Care – PPO | Source: Ambulatory Visit | Attending: Radiation Oncology | Admitting: Radiation Oncology

## 2019-06-05 ENCOUNTER — Other Ambulatory Visit: Payer: Self-pay

## 2019-06-05 DIAGNOSIS — C50011 Malignant neoplasm of nipple and areola, right female breast: Secondary | ICD-10-CM | POA: Diagnosis not present

## 2019-06-05 DIAGNOSIS — Z170 Estrogen receptor positive status [ER+]: Secondary | ICD-10-CM | POA: Diagnosis not present

## 2019-06-05 DIAGNOSIS — Z452 Encounter for adjustment and management of vascular access device: Secondary | ICD-10-CM | POA: Diagnosis present

## 2019-06-05 HISTORY — PX: IR REMOVAL TUN ACCESS W/ PORT W/O FL MOD SED: IMG2290

## 2019-06-05 LAB — CBC WITH DIFFERENTIAL/PLATELET
Abs Immature Granulocytes: 0 10*3/uL (ref 0.00–0.07)
Basophils Absolute: 0 10*3/uL (ref 0.0–0.1)
Basophils Relative: 1 %
Eosinophils Absolute: 0.5 10*3/uL (ref 0.0–0.5)
Eosinophils Relative: 12 %
HCT: 41.8 % (ref 36.0–46.0)
Hemoglobin: 12.9 g/dL (ref 12.0–15.0)
Immature Granulocytes: 0 %
Lymphocytes Relative: 24 %
Lymphs Abs: 0.9 10*3/uL (ref 0.7–4.0)
MCH: 27.3 pg (ref 26.0–34.0)
MCHC: 30.9 g/dL (ref 30.0–36.0)
MCV: 88.6 fL (ref 80.0–100.0)
Monocytes Absolute: 0.3 10*3/uL (ref 0.1–1.0)
Monocytes Relative: 8 %
Neutro Abs: 2.1 10*3/uL (ref 1.7–7.7)
Neutrophils Relative %: 55 %
Platelets: 195 10*3/uL (ref 150–400)
RBC: 4.72 MIL/uL (ref 3.87–5.11)
RDW: 14.2 % (ref 11.5–15.5)
WBC: 3.9 10*3/uL — ABNORMAL LOW (ref 4.0–10.5)
nRBC: 0 % (ref 0.0–0.2)

## 2019-06-05 LAB — PROTIME-INR
INR: 1 (ref 0.8–1.2)
Prothrombin Time: 13.2 seconds (ref 11.4–15.2)

## 2019-06-05 MED ORDER — SODIUM CHLORIDE 0.9 % IV SOLN
INTRAVENOUS | Status: DC
  Administered 2019-06-05: 09:00:00 via INTRAVENOUS

## 2019-06-05 MED ORDER — LIDOCAINE-EPINEPHRINE (PF) 2 %-1:200000 IJ SOLN
INTRAMUSCULAR | Status: DC | PRN
  Administered 2019-06-05: 10 mL via INTRADERMAL

## 2019-06-05 MED ORDER — LIDOCAINE-EPINEPHRINE 1 %-1:100000 IJ SOLN
INTRAMUSCULAR | Status: AC
  Filled 2019-06-05: qty 1

## 2019-06-05 MED ORDER — CEFAZOLIN SODIUM-DEXTROSE 2-4 GM/100ML-% IV SOLN
INTRAVENOUS | Status: AC
  Filled 2019-06-05: qty 100

## 2019-06-05 MED ORDER — CEFAZOLIN SODIUM-DEXTROSE 2-4 GM/100ML-% IV SOLN
2.0000 g | INTRAVENOUS | Status: AC
  Administered 2019-06-05: 2 g via INTRAVENOUS

## 2019-06-05 NOTE — Progress Notes (Signed)
Patient ID: Ariana Luna, female   DOB: Jan 31, 1976, 43 y.o.   MRN: [id number] Patient presents to IR department today for Port-A-Cath removal.  Port-A-Cath was placed on 12/28/2018.  She has a history of right breast cancer, status post surgery and chemoradiation.  She has completed treatment.  Details/risks of Port-A-Cath removal, including but not limited to, internal bleeding, infection, injury to adjacent structures discussed with patient via interpreter with her understanding and consent.  Patient does not wish to receive IV conscious sedation for procedure.

## 2019-06-05 NOTE — Procedures (Signed)
Breast ca, completed chemo  S/p left port removal  No comp Stable ebl min Full report in pacs

## 2019-06-05 NOTE — Discharge Instructions (Signed)
Implanted Port Removal, Care After °This sheet gives you information about how to care for yourself after your procedure. Your health care provider may also give you more specific instructions. If you have problems or questions, contact your health care provider. °What can I expect after the procedure? °After the procedure, it is common to have: °· Soreness or pain near your incision. °· Some swelling or bruising near your incision. °Follow these instructions at home: °Medicines °· Take over-the-counter and prescription medicines only as told by your health care provider. °· If you were prescribed an antibiotic medicine, take it as told by your health care provider. Do not stop taking the antibiotic even if you start to feel better. °Bathing °· Do not take baths, swim, or use a hot tub until your health care provider approves. Ask your health care provider if you can take showers. You may only be allowed to take sponge baths. °Incision care ° °· Follow instructions from your health care provider about how to take care of your incision. Make sure you: °? Wash your hands with soap and water before you change your bandage (dressing). If soap and water are not available, use hand sanitizer. °? Change your dressing as told by your health care provider. °? Keep your dressing dry. °? Leave stitches (sutures), skin glue, or adhesive strips in place. These skin closures may need to stay in place for 2 weeks or longer. If adhesive strip edges start to loosen and curl up, you may trim the loose edges. Do not remove adhesive strips completely unless your health care provider tells you to do that. °· Check your incision area every day for signs of infection. Check for: °? More redness, swelling, or pain. °? More fluid or blood. °? Warmth. °? Pus or a bad smell. °Driving ° °· Do not drive for 24 hours if you were given a medicine to help you relax (sedative) during your procedure. °· If you did not receive a sedative, ask your  health care provider when it is safe to drive. °Activity °· Return to your normal activities as told by your health care provider. Ask your health care provider what activities are safe for you. °· Do not lift anything that is heavier than 10 lb (4.5 kg), or the limit that you are told, until your health care provider says that it is safe. °· Do not do activities that involve lifting your arms over your head. °General instructions °· Do not use any products that contain nicotine or tobacco, such as cigarettes and e-cigarettes. These can delay healing. If you need help quitting, ask your health care provider. °· Keep all follow-up visits as told by your health care provider. This is important. °Contact a health care provider if: °· You have more redness, swelling, or pain around your incision. °· You have more fluid or blood coming from your incision. °· Your incision feels warm to the touch. °· You have pus or a bad smell coming from your incision. °· You have pain that is not relieved by your pain medicine. °Get help right away if you have: °· A fever or chills. °· Chest pain. °· Difficulty breathing. °Summary °· After the procedure, it is common to have pain, soreness, swelling, or bruising near your incision. °· If you were prescribed an antibiotic medicine, take it as told by your health care provider. Do not stop taking the antibiotic even if you start to feel better. °· Do not drive for 24 hours   if you were given a sedative during your procedure. °· Return to your normal activities as told by your health care provider. Ask your health care provider what activities are safe for you. °This information is not intended to replace advice given to you by your health care provider. Make sure you discuss any questions you have with your health care provider. °Document Released: 10/06/2015 Document Revised: 12/08/2017 Document Reviewed: 12/08/2017 °Elsevier Patient Education © 2020 [employer] ° °

## 2019-06-05 NOTE — Progress Notes (Signed)
Patient checked in for port removal with [place] interpreter [place].

## 2019-06-06 ENCOUNTER — Ambulatory Visit
Admission: RE | Admit: 2019-06-06 | Discharge: 2019-06-06 | Disposition: A | Discharge status: Home / Self Care | Payer: BC Managed Care – PPO | Source: Ambulatory Visit | Attending: Radiation Oncology | Admitting: Radiation Oncology

## 2019-06-06 ENCOUNTER — Other Ambulatory Visit: Payer: Self-pay

## 2019-06-06 DIAGNOSIS — C50011 Malignant neoplasm of nipple and areola, right female breast: Secondary | ICD-10-CM | POA: Diagnosis not present

## 2019-06-07 ENCOUNTER — Other Ambulatory Visit: Payer: Self-pay

## 2019-06-07 ENCOUNTER — Ambulatory Visit
Admission: RE | Admit: 2019-06-07 | Discharge: 2019-06-07 | Disposition: A | Discharge status: Home / Self Care | Payer: BC Managed Care – PPO | Source: Ambulatory Visit | Attending: Radiation Oncology | Admitting: Radiation Oncology

## 2019-06-07 DIAGNOSIS — C50011 Malignant neoplasm of nipple and areola, right female breast: Secondary | ICD-10-CM | POA: Diagnosis not present

## 2019-06-08 ENCOUNTER — Other Ambulatory Visit: Payer: Self-pay

## 2019-06-08 ENCOUNTER — Ambulatory Visit
Admission: RE | Admit: 2019-06-08 | Discharge: 2019-06-08 | Disposition: A | Discharge status: Home / Self Care | Payer: BC Managed Care – PPO | Source: Ambulatory Visit | Attending: Radiation Oncology | Admitting: Radiation Oncology

## 2019-06-08 DIAGNOSIS — C50011 Malignant neoplasm of nipple and areola, right female breast: Secondary | ICD-10-CM | POA: Diagnosis not present

## 2019-06-11 ENCOUNTER — Other Ambulatory Visit: Payer: Self-pay

## 2019-06-11 ENCOUNTER — Ambulatory Visit
Admission: RE | Admit: 2019-06-11 | Discharge: 2019-06-11 | Disposition: A | Discharge status: Home / Self Care | Payer: BC Managed Care – PPO | Source: Ambulatory Visit | Attending: Radiation Oncology | Admitting: Radiation Oncology

## 2019-06-11 DIAGNOSIS — Z170 Estrogen receptor positive status [ER+]: Secondary | ICD-10-CM | POA: Insufficient documentation

## 2019-06-11 DIAGNOSIS — C50011 Malignant neoplasm of nipple and areola, right female breast: Secondary | ICD-10-CM | POA: Diagnosis present

## 2019-06-11 DIAGNOSIS — Z510 Encounter for antineoplastic radiation therapy: Secondary | ICD-10-CM | POA: Diagnosis not present

## 2019-06-12 ENCOUNTER — Other Ambulatory Visit: Payer: Self-pay

## 2019-06-12 ENCOUNTER — Ambulatory Visit
Admission: RE | Admit: 2019-06-12 | Discharge: 2019-06-12 | Disposition: A | Discharge status: Home / Self Care | Payer: BC Managed Care – PPO | Source: Ambulatory Visit | Attending: Radiation Oncology | Admitting: Radiation Oncology

## 2019-06-12 DIAGNOSIS — C50011 Malignant neoplasm of nipple and areola, right female breast: Secondary | ICD-10-CM | POA: Diagnosis not present

## 2019-06-13 ENCOUNTER — Ambulatory Visit
Admission: RE | Admit: 2019-06-13 | Discharge: 2019-06-13 | Disposition: A | Discharge status: Home / Self Care | Payer: BC Managed Care – PPO | Source: Ambulatory Visit | Attending: Radiation Oncology | Admitting: Radiation Oncology

## 2019-06-13 ENCOUNTER — Other Ambulatory Visit: Payer: Self-pay

## 2019-06-13 DIAGNOSIS — C50011 Malignant neoplasm of nipple and areola, right female breast: Secondary | ICD-10-CM | POA: Diagnosis not present

## 2019-06-14 ENCOUNTER — Ambulatory Visit
Admission: RE | Admit: 2019-06-14 | Discharge: 2019-06-14 | Disposition: A | Discharge status: Home / Self Care | Payer: BC Managed Care – PPO | Source: Ambulatory Visit | Attending: Radiation Oncology | Admitting: Radiation Oncology

## 2019-06-14 ENCOUNTER — Other Ambulatory Visit: Payer: Self-pay

## 2019-06-14 DIAGNOSIS — C50011 Malignant neoplasm of nipple and areola, right female breast: Secondary | ICD-10-CM | POA: Diagnosis not present

## 2019-06-15 ENCOUNTER — Ambulatory Visit
Admission: RE | Admit: 2019-06-15 | Discharge: 2019-06-15 | Disposition: A | Discharge status: Home / Self Care | Payer: BC Managed Care – PPO | Source: Ambulatory Visit | Attending: Radiation Oncology | Admitting: Radiation Oncology

## 2019-06-15 ENCOUNTER — Other Ambulatory Visit: Payer: Self-pay

## 2019-06-15 DIAGNOSIS — C50011 Malignant neoplasm of nipple and areola, right female breast: Secondary | ICD-10-CM | POA: Diagnosis not present

## 2019-06-18 ENCOUNTER — Ambulatory Visit
Admission: RE | Admit: 2019-06-18 | Discharge: 2019-06-18 | Disposition: A | Discharge status: Home / Self Care | Payer: BC Managed Care – PPO | Source: Ambulatory Visit | Attending: Radiation Oncology | Admitting: Radiation Oncology

## 2019-06-18 ENCOUNTER — Other Ambulatory Visit: Payer: Self-pay

## 2019-06-18 DIAGNOSIS — C50011 Malignant neoplasm of nipple and areola, right female breast: Secondary | ICD-10-CM | POA: Diagnosis not present

## 2019-06-19 ENCOUNTER — Other Ambulatory Visit: Payer: Self-pay

## 2019-06-19 ENCOUNTER — Ambulatory Visit
Admission: RE | Admit: 2019-06-19 | Discharge: 2019-06-19 | Disposition: A | Discharge status: Home / Self Care | Payer: BC Managed Care – PPO | Source: Ambulatory Visit | Attending: Radiation Oncology | Admitting: Radiation Oncology

## 2019-06-19 DIAGNOSIS — C50011 Malignant neoplasm of nipple and areola, right female breast: Secondary | ICD-10-CM | POA: Diagnosis not present

## 2019-06-20 ENCOUNTER — Other Ambulatory Visit: Payer: Self-pay

## 2019-06-20 ENCOUNTER — Ambulatory Visit
Admission: RE | Admit: 2019-06-20 | Discharge: 2019-06-20 | Disposition: A | Discharge status: Home / Self Care | Payer: BC Managed Care – PPO | Source: Ambulatory Visit | Attending: Radiation Oncology | Admitting: Radiation Oncology

## 2019-06-20 DIAGNOSIS — C50011 Malignant neoplasm of nipple and areola, right female breast: Secondary | ICD-10-CM | POA: Diagnosis not present

## 2019-06-21 ENCOUNTER — Other Ambulatory Visit: Payer: Self-pay

## 2019-06-21 ENCOUNTER — Ambulatory Visit
Admission: RE | Admit: 2019-06-21 | Discharge: 2019-06-21 | Disposition: A | Discharge status: Home / Self Care | Payer: BC Managed Care – PPO | Source: Ambulatory Visit | Attending: Radiation Oncology | Admitting: Radiation Oncology

## 2019-06-21 DIAGNOSIS — C50011 Malignant neoplasm of nipple and areola, right female breast: Secondary | ICD-10-CM | POA: Diagnosis not present

## 2019-06-22 ENCOUNTER — Other Ambulatory Visit: Payer: Self-pay

## 2019-06-22 ENCOUNTER — Ambulatory Visit
Admission: RE | Admit: 2019-06-22 | Discharge: 2019-06-22 | Disposition: A | Discharge status: Home / Self Care | Payer: BC Managed Care – PPO | Source: Ambulatory Visit | Attending: Radiation Oncology | Admitting: Radiation Oncology

## 2019-06-22 DIAGNOSIS — C50011 Malignant neoplasm of nipple and areola, right female breast: Secondary | ICD-10-CM | POA: Diagnosis not present

## 2019-06-25 ENCOUNTER — Other Ambulatory Visit: Payer: Self-pay

## 2019-06-25 ENCOUNTER — Ambulatory Visit
Admission: RE | Admit: 2019-06-25 | Discharge: 2019-06-25 | Disposition: A | Discharge status: Home / Self Care | Payer: BC Managed Care – PPO | Source: Ambulatory Visit | Attending: Radiation Oncology | Admitting: Radiation Oncology

## 2019-06-25 DIAGNOSIS — C50011 Malignant neoplasm of nipple and areola, right female breast: Secondary | ICD-10-CM | POA: Diagnosis not present

## 2019-06-26 ENCOUNTER — Ambulatory Visit
Admission: RE | Admit: 2019-06-26 | Discharge: 2019-06-26 | Disposition: A | Discharge status: Home / Self Care | Payer: BC Managed Care – PPO | Source: Ambulatory Visit | Attending: Radiation Oncology | Admitting: Radiation Oncology

## 2019-06-26 ENCOUNTER — Other Ambulatory Visit: Payer: Self-pay

## 2019-06-26 DIAGNOSIS — C50011 Malignant neoplasm of nipple and areola, right female breast: Secondary | ICD-10-CM | POA: Diagnosis not present

## 2019-06-27 ENCOUNTER — Other Ambulatory Visit: Payer: Self-pay

## 2019-06-27 ENCOUNTER — Ambulatory Visit
Admission: RE | Admit: 2019-06-27 | Discharge: 2019-06-27 | Disposition: A | Discharge status: Home / Self Care | Payer: BC Managed Care – PPO | Source: Ambulatory Visit | Attending: Radiation Oncology | Admitting: Radiation Oncology

## 2019-06-27 DIAGNOSIS — C50011 Malignant neoplasm of nipple and areola, right female breast: Secondary | ICD-10-CM | POA: Diagnosis not present

## 2019-06-28 ENCOUNTER — Other Ambulatory Visit: Payer: Self-pay

## 2019-06-28 ENCOUNTER — Ambulatory Visit
Admission: RE | Admit: 2019-06-28 | Discharge: 2019-06-28 | Disposition: A | Discharge status: Home / Self Care | Payer: BC Managed Care – PPO | Source: Ambulatory Visit | Attending: Radiation Oncology | Admitting: Radiation Oncology

## 2019-06-28 DIAGNOSIS — C50011 Malignant neoplasm of nipple and areola, right female breast: Secondary | ICD-10-CM | POA: Diagnosis not present

## 2019-06-29 ENCOUNTER — Other Ambulatory Visit: Payer: Self-pay

## 2019-06-29 ENCOUNTER — Ambulatory Visit
Admission: RE | Admit: 2019-06-29 | Discharge: 2019-06-29 | Disposition: A | Discharge status: Home / Self Care | Payer: BC Managed Care – PPO | Source: Ambulatory Visit | Attending: Radiation Oncology | Admitting: Radiation Oncology

## 2019-06-29 DIAGNOSIS — C50011 Malignant neoplasm of nipple and areola, right female breast: Secondary | ICD-10-CM | POA: Diagnosis not present

## 2019-07-02 ENCOUNTER — Ambulatory Visit
Admission: RE | Admit: 2019-07-02 | Discharge: 2019-07-02 | Disposition: A | Discharge status: Home / Self Care | Payer: BC Managed Care – PPO | Source: Ambulatory Visit | Attending: Radiation Oncology | Admitting: Radiation Oncology

## 2019-07-02 ENCOUNTER — Other Ambulatory Visit: Payer: Self-pay

## 2019-07-02 DIAGNOSIS — C50011 Malignant neoplasm of nipple and areola, right female breast: Secondary | ICD-10-CM | POA: Diagnosis not present

## 2019-07-02 NOTE — Assessment & Plan Note (Signed)
09/30/2018:Right mastectomy with axillary lymph node dissection in [place]: Grade 2 IDC, 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 Adjuvant radiation therapy because of the tumor size being large: Started 05/31/2019 3. Followed by adjuvant antiestrogen therapy with tamoxifen 20 mg daily x10 years ------------------------------------------------------------------------------------------------------------------- I recommended antiestrogen therapy with tamoxifen 20 mg daily x10 years.  Return to clinic in 3 months for survivorship care plan visit

## 2019-07-03 ENCOUNTER — Ambulatory Visit
Admission: RE | Admit: 2019-07-03 | Discharge: 2019-07-03 | Disposition: A | Discharge status: Home / Self Care | Payer: BC Managed Care – PPO | Source: Ambulatory Visit | Attending: Radiation Oncology | Admitting: Radiation Oncology

## 2019-07-03 DIAGNOSIS — C50011 Malignant neoplasm of nipple and areola, right female breast: Secondary | ICD-10-CM | POA: Diagnosis not present

## 2019-07-04 ENCOUNTER — Ambulatory Visit
Admission: RE | Admit: 2019-07-04 | Discharge: 2019-07-04 | Disposition: A | Discharge status: Home / Self Care | Payer: BC Managed Care – PPO | Source: Ambulatory Visit | Attending: Radiation Oncology | Admitting: Radiation Oncology

## 2019-07-04 DIAGNOSIS — C50011 Malignant neoplasm of nipple and areola, right female breast: Secondary | ICD-10-CM | POA: Diagnosis not present

## 2019-07-05 ENCOUNTER — Other Ambulatory Visit: Payer: Self-pay

## 2019-07-05 ENCOUNTER — Ambulatory Visit
Admission: RE | Admit: 2019-07-05 | Discharge: 2019-07-05 | Disposition: A | Discharge status: Home / Self Care | Payer: BC Managed Care – PPO | Source: Ambulatory Visit | Attending: Radiation Oncology | Admitting: Radiation Oncology

## 2019-07-05 DIAGNOSIS — C50011 Malignant neoplasm of nipple and areola, right female breast: Secondary | ICD-10-CM | POA: Diagnosis not present

## 2019-07-06 ENCOUNTER — Other Ambulatory Visit: Payer: Self-pay

## 2019-07-06 ENCOUNTER — Ambulatory Visit
Admission: RE | Admit: 2019-07-06 | Discharge: 2019-07-06 | Disposition: A | Discharge status: Home / Self Care | Payer: BC Managed Care – PPO | Source: Ambulatory Visit | Attending: Radiation Oncology | Admitting: Radiation Oncology

## 2019-07-06 DIAGNOSIS — C50011 Malignant neoplasm of nipple and areola, right female breast: Secondary | ICD-10-CM | POA: Diagnosis not present

## 2019-07-08 NOTE — Progress Notes (Signed)
Patient Care Team: Newton Pigg, MD as PCP - General (Obstetrics and Gynecology)  DIAGNOSIS:    ICD-10-CM   1. 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, estrogen receptor positive ([place])  09/30/2018 Surgery   Right mastectomy with axillary lymph node dissection in [place]: Grade 2 IDC, 5.3 cm, DCIS, margins negative, lymphovascular invasion present, 0/18 lymph nodes, ER 60%, PR 0%, HER-2 negative, Ki-67 90%, T3N0 stage II B   11/15/2018 Cancer Staging   Staging form: Breast, AJCC 8th Edition - Pathologic: Stage IIB (pT3, pN0, cM0, G2, ER+, PR-, HER2-) - Signed by Nicholas Lose, MD on 11/15/2018   11/29/2018 Oncotype testing   Oncotype DX score 28: 17% risk of distant recurrence at 9 years   12/08/2018 Genetic Testing   Negative genetic testing on the common hereditary cancer panel.  The Hereditary Gene Panel offered by [facility] includes sequencing and/or deletion duplication testing of the following 47 genes: APC, ATM, AXIN2, BARD1, BMPR1A, BRCA1, BRCA2, BRIP1, CDH1, CDK4, CDKN2A (p14ARF), CDKN2A (p16INK4a), CHEK2, CTNNA1, DICER1, EPCAM (Deletion/duplication testing only), GREM1 (promoter region deletion/duplication testing only), KIT, MEN1, MLH1, MSH2, MSH3, MSH6, MUTYH, NBN, NF1, NHTL1, PALB2, PDGFRA, PMS2, POLD1, POLE, PTEN, RAD50, RAD51C, RAD51D, SDHB, SDHC, SDHD, SMAD4, SMARCA4. STK11, TP53, TSC1, TSC2, and VHL.  The following genes were evaluated for sequence changes only: SDHA and HOXB13 c.251G>A variant only. The report date is December 08, 2018.   12/29/2018 -  Chemotherapy   Adjuvant chemotherapy with dose dense Adriamycin and Cytoxan followed by Taxol    05/31/2019 -  Radiation Therapy   Adjuvant XRT     CHIEF COMPLIANT: Follow-up to discuss anti-estrogen  therapy  INTERVAL HISTORY: Ariana Luna is a 43 y.o. with above-mentioned history of right breast cancer treated with right mastectomy in [place] and whocompletedadjuvant chemotherapy. She is currently undergoing radiation therapy. She presents to the clinic today to discuss anti-estrogen therapy.  She will complete radiation on Wednesday.  She does have radiation dermatitis.  REVIEW OF SYSTEMS:   Constitutional: Denies fevers, chills or abnormal weight loss Eyes: Denies blurriness of vision Ears, nose, mouth, throat, and face: Denies mucositis or sore throat Respiratory: Denies cough, dyspnea or wheezes Cardiovascular: Denies palpitation, chest discomfort Gastrointestinal: Denies nausea, heartburn or change in bowel habits Skin: Denies abnormal skin rashes Lymphatics: Denies new lymphadenopathy or easy bruising Neurological: Denies numbness, tingling or new weaknesses Behavioral/Psych: Mood is stable, no new changes  Extremities: No lower extremity edema Breast: Mild radiation dermatitis All other systems were reviewed with the patient and are negative.  I have reviewed the past medical history, past surgical history, social history and family history with the patient and they are unchanged from previous note.  ALLERGIES:  has No Known Allergies.  MEDICATIONS:  Current Outpatient Medications  Medication Sig Dispense Refill  . amoxicillin (AMOXIL) 500 MG capsule Take 1 capsule (500 mg total) by mouth 2 (two) times daily. 14 capsule 0  . lidocaine-prilocaine (EMLA) cream Apply 1 application topically as needed. 30 g 0  . lidocaine-prilocaine (EMLA) cream Apply to affected area once 30 g 3  . LORazepam (ATIVAN) 0.5 MG tablet Take 1 tablet (0.5 mg total) by mouth at bedtime as needed for sleep. 30 tablet 0  . omeprazole (PRILOSEC) 20 MG capsule Take 1 tablet daily.  30 capsule 0  . ondansetron (ZOFRAN) 8 MG tablet Take 1 tablet (8 mg total) by mouth 2 (two) times daily as needed.  Start on the third day after chemotherapy. 30 tablet 1  . prochlorperazine (COMPAZINE) 10 MG tablet Take 1 tablet (10 mg total) by mouth every 6 (six) hours as needed (Nausea or vomiting). 30 tablet 1   No current facility-administered medications for this visit.     PHYSICAL EXAMINATION: ECOG PERFORMANCE STATUS: 1 - Symptomatic but completely ambulatory  Vitals:   07/09/19 1544  BP: 108/73  Pulse: 84  Resp: 18  Temp: 98.7 F (37.1 C)  SpO2: 100%   Filed Weights   07/09/19 1544  Weight: 113 lb 1.6 oz (51.3 kg)    GENERAL: alert, no distress and comfortable SKIN: skin color, texture, turgor are normal, no rashes or significant lesions EYES: normal, Conjunctiva are pink and non-injected, sclera clear OROPHARYNX: no exudate, no erythema and lips, buccal mucosa, and tongue normal  NECK: supple, thyroid normal size, non-tender, without nodularity LYMPH: no palpable lymphadenopathy in the cervical, axillary or inguinal LUNGS: clear to auscultation and percussion with normal breathing effort HEART: regular rate & rhythm and no murmurs and no lower extremity edema ABDOMEN: abdomen soft, non-tender and normal bowel sounds MUSCULOSKELETAL: no cyanosis of digits and no clubbing  NEURO: alert & oriented x 3 with fluent speech, no focal motor/sensory deficits EXTREMITIES: No lower extremity edema  LABORATORY DATA:  I have reviewed the data as listed CMP Latest Ref Rng & Units 05/10/2019 05/04/2019 04/27/2019  Glucose 70 - 99 mg/dL 103(H) 103(H) 93  BUN 6 - 20 mg/dL 14 11 15   Creatinine 0.44 - 1.00 mg/dL 0.64 0.66 0.67  Sodium 135 - 145 mmol/L 139 138 139  Potassium 3.5 - 5.1 mmol/L 3.9 3.7 3.8  Chloride 98 - 111 mmol/L 103 105 105  CO2 22 - 32 mmol/L 26 24 23   Calcium 8.9 - 10.3 mg/dL 9.1 9.1 9.2  Total Protein 6.5 - 8.1 g/dL 7.3 7.2 7.7  Total Bilirubin 0.3 - 1.2 mg/dL 0.4 0.3 0.3  Alkaline Phos 38 - 126 U/L 115 125 123  AST 15 - 41 U/L 53(H) 66(H) 68(H)  ALT 0 - 44 U/L 75(H) 94(H)  71(H)    Lab Results  Component Value Date   WBC 3.9 (L) 06/05/2019   HGB 12.9 06/05/2019   HCT 41.8 06/05/2019   MCV 88.6 06/05/2019   PLT 195 06/05/2019   NEUTROABS 2.1 06/05/2019    ASSESSMENT & PLAN:  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 Adjuvant radiation therapy because of the tumor size being large: Started 05/31/2019 3. Followed by adjuvant antiestrogen therapy with tamoxifen 20 mg daily x10 years ------------------------------------------------------------------------------------------------------------------- I recommended antiestrogen therapy with tamoxifen 20 mg daily x10 years. We discussed the risks and benefits of tamoxifen. These include but not limited to insomnia, hot flashes, mood changes, vaginal dryness, and weight gain. Although rare, serious side effects including endometrial cancer, risk of blood clots were also discussed. We strongly believe that the benefits far outweigh the risks. Patient understands these risks and consented to  starting treatment. Planned treatment duration is 10 years.  Patient brought in large quantities of supplements and I instructed her that she does not need all of them.  But I suspect that she will take them anyway.  Return to clinic in 3 months for survivorship care plan visit   No orders of the defined types were placed in this encounter.  The patient has a good understanding of the overall plan. she agrees with it. she will call with any problems that may develop  before the next visit here.  Nicholas Lose, MD 07/09/2019  Julious Oka Dorshimer am acting as scribe for Dr. Nicholas Lose.  I have reviewed the above documentation for accuracy and completeness, and I agree with the above.

## 2019-07-09 ENCOUNTER — Ambulatory Visit
Admission: RE | Admit: 2019-07-09 | Discharge: 2019-07-09 | Disposition: A | Discharge status: Home / Self Care | Payer: BC Managed Care – PPO | Source: Ambulatory Visit | Attending: Radiation Oncology | Admitting: Radiation Oncology

## 2019-07-09 ENCOUNTER — Other Ambulatory Visit: Payer: Self-pay

## 2019-07-09 ENCOUNTER — Inpatient Hospital Stay: Payer: BC Managed Care – PPO | Attending: Hematology and Oncology | Admitting: Hematology and Oncology

## 2019-07-09 DIAGNOSIS — Z9011 Acquired absence of right breast and nipple: Secondary | ICD-10-CM | POA: Insufficient documentation

## 2019-07-09 DIAGNOSIS — Z923 Personal history of irradiation: Secondary | ICD-10-CM | POA: Insufficient documentation

## 2019-07-09 DIAGNOSIS — Z79810 Long term (current) use of selective estrogen receptor modulators (SERMs): Secondary | ICD-10-CM | POA: Insufficient documentation

## 2019-07-09 DIAGNOSIS — Z79899 Other long term (current) drug therapy: Secondary | ICD-10-CM | POA: Diagnosis not present

## 2019-07-09 DIAGNOSIS — Z9221 Personal history of antineoplastic chemotherapy: Secondary | ICD-10-CM | POA: Insufficient documentation

## 2019-07-09 DIAGNOSIS — L598 Other specified disorders of the skin and subcutaneous tissue related to radiation: Secondary | ICD-10-CM | POA: Diagnosis not present

## 2019-07-09 DIAGNOSIS — C50011 Malignant neoplasm of nipple and areola, right female breast: Secondary | ICD-10-CM | POA: Insufficient documentation

## 2019-07-09 DIAGNOSIS — Z170 Estrogen receptor positive status [ER+]: Secondary | ICD-10-CM | POA: Diagnosis not present

## 2019-07-09 MED ORDER — TAMOXIFEN CITRATE 20 MG PO TABS: 20.0000 mg | ORAL_TABLET | Freq: Every day | ORAL | 3 refills | Status: DC

## 2019-07-10 ENCOUNTER — Ambulatory Visit
Admission: RE | Admit: 2019-07-10 | Discharge: 2019-07-10 | Disposition: A | Discharge status: Home / Self Care | Payer: BC Managed Care – PPO | Source: Ambulatory Visit | Attending: Radiation Oncology | Admitting: Radiation Oncology

## 2019-07-10 ENCOUNTER — Telehealth: Payer: Self-pay | Admitting: Hematology and Oncology

## 2019-07-10 DIAGNOSIS — Z170 Estrogen receptor positive status [ER+]: Secondary | ICD-10-CM | POA: Diagnosis not present

## 2019-07-10 DIAGNOSIS — C50011 Malignant neoplasm of nipple and areola, right female breast: Secondary | ICD-10-CM | POA: Diagnosis not present

## 2019-07-10 DIAGNOSIS — Z510 Encounter for antineoplastic radiation therapy: Secondary | ICD-10-CM | POA: Diagnosis not present

## 2019-07-10 NOTE — Telephone Encounter (Signed)
I talk with patient regarding schedule  

## 2019-07-11 ENCOUNTER — Ambulatory Visit
Admission: RE | Admit: 2019-07-11 | Discharge: 2019-07-11 | Disposition: A | Discharge status: Home / Self Care | Payer: BC Managed Care – PPO | Source: Ambulatory Visit | Attending: Radiation Oncology | Admitting: Radiation Oncology

## 2019-07-11 ENCOUNTER — Encounter: Payer: Self-pay | Admitting: Radiation Oncology

## 2019-07-11 ENCOUNTER — Other Ambulatory Visit: Payer: Self-pay

## 2019-07-11 ENCOUNTER — Encounter: Payer: Self-pay | Admitting: *Deleted

## 2019-07-11 DIAGNOSIS — C50011 Malignant neoplasm of nipple and areola, right female breast: Secondary | ICD-10-CM | POA: Diagnosis not present

## 2019-08-06 ENCOUNTER — Telehealth: Payer: Self-pay

## 2019-08-06 NOTE — Telephone Encounter (Signed)
RN spoke with patient's family, with patient's permission regarding recent symptoms.   Pt reporting through family that she is having severe wrist pain, joint pain, hot flashes, and shortness of breath.   RN inquired if patient is able to walk without exacerbation.  Family reports pt is barely able to walk, "feels likes she is going to pass out", and very weak at baseline.    RN recommend patient to report immediately to ED with given symptoms and being on tamoxifen.  Family verbalized understanding.

## 2019-08-10 ENCOUNTER — Encounter: Payer: Self-pay | Admitting: Radiation Oncology

## 2019-08-10 ENCOUNTER — Ambulatory Visit
Admission: RE | Admit: 2019-08-10 | Discharge: 2019-08-10 | Disposition: A | Discharge status: Home / Self Care | Payer: BC Managed Care – PPO | Source: Ambulatory Visit | Attending: Radiation Oncology | Admitting: Radiation Oncology

## 2019-08-10 ENCOUNTER — Telehealth: Payer: Self-pay

## 2019-08-10 ENCOUNTER — Other Ambulatory Visit: Payer: Self-pay

## 2019-08-10 VITALS — BP 94/69 | HR 95 | Temp 98.9°F | Wt 109.0 lb

## 2019-08-10 DIAGNOSIS — Z79810 Long term (current) use of selective estrogen receptor modulators (SERMs): Secondary | ICD-10-CM | POA: Insufficient documentation

## 2019-08-10 DIAGNOSIS — Z170 Estrogen receptor positive status [ER+]: Secondary | ICD-10-CM | POA: Diagnosis not present

## 2019-08-10 DIAGNOSIS — Z923 Personal history of irradiation: Secondary | ICD-10-CM | POA: Insufficient documentation

## 2019-08-10 DIAGNOSIS — C50011 Malignant neoplasm of nipple and areola, right female breast: Secondary | ICD-10-CM | POA: Diagnosis present

## 2019-08-10 DIAGNOSIS — Z79899 Other long term (current) drug therapy: Secondary | ICD-10-CM | POA: Diagnosis not present

## 2019-08-10 NOTE — Progress Notes (Signed)
Radiation Oncology         [phone]) [phone] ________________________________  Name: Ariana Luna MRN: [id number]  Date: 08/10/2019  DOB: [birth year]  Follow-Up Visit Note  Outpatient  CC: Newton Pigg, MD  Newton Pigg, MD  Diagnosis and Prior Radiotherapy:    ICD-10-CM   1. 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  She reports wrist pain related to taking tamoxifen.  She denies current shortness of breath.  Her skin is healing well.  She denies any swelling or pain in her calves or upper arms.  Her husband reports that she had been told to come in to get her lungs scanned to rule out a pulmonary embolism because she had been having some shortness of breath initially after taking tamoxifen but she decided not to do that.  She does not have follow-up with medical oncology until December                              ALLERGIES:  has No Known Allergies.  Meds: Current Outpatient Medications  Medication Sig Dispense Refill  . tamoxifen (NOLVADEX) 20 MG tablet Take 1 tablet (20 mg total) by mouth daily. 90 tablet 3  . amoxicillin (AMOXIL) 500 MG capsule Take 1 capsule (500 mg total) by mouth 2 (two) times daily. (Patient not taking: Reported on 08/10/2019) 14 capsule 0  . lidocaine-prilocaine (EMLA) cream Apply 1 application topically as needed. (Patient not taking: Reported on 08/10/2019) 30 g 0  . lidocaine-prilocaine (EMLA) cream Apply to affected area once (Patient not taking: Reported on 08/10/2019) 30 g 3  . LORazepam (ATIVAN) 0.5 MG tablet Take 1 tablet (0.5 mg total) by mouth at bedtime as needed for sleep. (Patient not taking: Reported on 08/10/2019) 30 tablet 0  . omeprazole (PRILOSEC) 20 MG capsule Take 1 tablet daily. 30 capsule 0  . ondansetron (ZOFRAN)  8 MG tablet Take 1 tablet (8 mg total) by mouth 2 (two) times daily as needed. Start on the third day after chemotherapy. (Patient not taking: Reported on 08/10/2019) 30 tablet 1  . prochlorperazine (COMPAZINE) 10 MG tablet Take 1 tablet (10 mg total) by mouth every 6 (six) hours as needed (Nausea or vomiting). (Patient not taking: Reported on 08/10/2019) 30 tablet 1   No current facility-administered medications for this encounter.     Physical Findings: The patient is in no acute distress. Patient is alert and oriented.  weight is 109 lb (49.4 kg). Her temperature is 98.9 F (37.2 C). Her blood pressure is 94/69 and her pulse is 95. Her oxygen saturation is 99%. .    Satisfactory skin healing in radiotherapy fields-still somewhat hyperpigmented and dry.  No respiratory distress.   Lab Findings: Lab Results  Component Value Date   WBC 3.9 (L) 06/05/2019   HGB 12.9 06/05/2019   HCT 41.8 06/05/2019   MCV 88.6 06/05/2019   PLT 195 06/05/2019    Radiographic Findings: No results found.  Impression/Plan: Healing well from radiotherapy.  Continue skin care with topical Vitamin E Oil and / or lotion for at least 2 more months for further healing.  I encouraged her to continue with yearly mammography as appropriate (for intact breast tissue) and followup with medical oncology.  My nurse is calling medical oncology to see if we can get her appointment moved up to next week due to severe pain in her wrists which she perceives to be related to tamoxifen.  I will see her back on an as-needed basis. I have encouraged her to call if she has any issues or concerns in the future. I wished her the very best.  An interpreter was present for this call.  The patient's significant other also spoke with me by speaker phone for part of the visit.  All questions were asked to their satisfaction.   I spent 15 minutes face to face with the patient and more than 50% of that time was spent in counseling and/or  coordination of care. _____________________________________   Eppie Gibson, MD

## 2019-08-10 NOTE — Telephone Encounter (Signed)
RN attempted contact to schedule patient for follow up with MD per Dr. Isidore Moos recommendation.  No answer, left voicemail for patient to return call.

## 2019-08-10 NOTE — Progress Notes (Signed)
Ariana Luna presents for follow up of radiation to her right chest. She reports doing well. The skin to her radiation site is slightly pigmented. She is using radiaplex as directed. She is aware to begin using vitamin E lotion when the radiaplex is complete. She is taking Tamoxifen daily. She reports pain to her lower arms, wrists, and knees.   BP 94/69 (BP Location: Left Arm)   Pulse 95   Temp 98.9 F (37.2 C)   Wt 109 lb (49.4 kg)   SpO2 99%   BMI 20.60 kg/m

## 2019-08-13 ENCOUNTER — Telehealth: Payer: Self-pay | Admitting: Hematology and Oncology

## 2019-08-13 NOTE — Telephone Encounter (Signed)
Scheduled appt per 10/5 and 10/2 sch message - pt emergency contact aware of appt

## 2019-08-14 NOTE — Progress Notes (Signed)
  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, estrogen receptor positive ([place]) Staging form: Breast, AJCC 8th Edition - Clinical stage from 11/15/2018: Stage IIIA (cT3, cN0, cM0, G3, ER+, PR-, HER2-) - Unsigned - Pathologic: Stage IIB (pT3, pN0, cM0, G2, ER+, PR-, HER2-) - Signed by Nicholas Lose, MD on 11/15/2018  Intent: Curative  Radiation Treatment Dates: 05/31/2019 through 07/11/2019 Site Technique Total Dose Dose per Fx Completed Fx Beam Energies  Breast: CW_Rt 3D 50/50 2 25/25 6X, 10X  Breast: CW_Rt_scv_pab 3D 45/45 1.8 25/25 6X  Breast: CW_Rt_Bst Electron 10/10 2 5/5 6E   Narrative: The patient tolerated radiation therapy relatively well.   Plan: The patient will follow-up with radiation oncology in 1 mo  -----------------------------------  Eppie Gibson, MD

## 2019-08-15 NOTE — Progress Notes (Signed)
Patient Care Team: Newton Pigg, MD as PCP - General (Obstetrics and Gynecology)  DIAGNOSIS:    ICD-10-CM   1. 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, estrogen receptor positive ([place])  09/30/2018 Surgery   Right mastectomy with axillary lymph node dissection in [place]: Grade 2 IDC, 5.3 cm, DCIS, margins negative, lymphovascular invasion present, 0/18 lymph nodes, ER 60%, PR 0%, HER-2 negative, Ki-67 90%, T3N0 stage II B   11/15/2018 Cancer Staging   Staging form: Breast, AJCC 8th Edition - Pathologic: Stage IIB (pT3, pN0, cM0, G2, ER+, PR-, HER2-) - Signed by Nicholas Lose, MD on 11/15/2018   11/29/2018 Oncotype testing   Oncotype DX score 28: 17% risk of distant recurrence at 9 years   12/08/2018 Genetic Testing   Negative genetic testing on the common hereditary cancer panel.  The Hereditary Gene Panel offered by [facility] includes sequencing and/or deletion duplication testing of the following 47 genes: APC, ATM, AXIN2, BARD1, BMPR1A, BRCA1, BRCA2, BRIP1, CDH1, CDK4, CDKN2A (p14ARF), CDKN2A (p16INK4a), CHEK2, CTNNA1, DICER1, EPCAM (Deletion/duplication testing only), GREM1 (promoter region deletion/duplication testing only), KIT, MEN1, MLH1, MSH2, MSH3, MSH6, MUTYH, NBN, NF1, NHTL1, PALB2, PDGFRA, PMS2, POLD1, POLE, PTEN, RAD50, RAD51C, RAD51D, SDHB, SDHC, SDHD, SMAD4, SMARCA4. STK11, TP53, TSC1, TSC2, and VHL.  The following genes were evaluated for sequence changes only: SDHA and HOXB13 c.251G>A variant only. The report date is December 08, 2018.   12/29/2018 -  Chemotherapy   Adjuvant chemotherapy with dose dense Adriamycin and Cytoxan followed by Taxol    05/31/2019 - 07/11/2019 Radiation Therapy   Adjuvant XRT   07/2019 -  Anti-estrogen oral therapy   Tamoxifen  75m daily     CHIEF COMPLIANT: Follow-up of right breast cancer on tamoxifen  INTERVAL HISTORY: Ariana Luna a 43y.o. with above-mentioned history of right breast cancer [place] radiation and is currently on anti-estrogen therapy with tamoxifen. She presents to the clinic today for follow-up.   REVIEW OF SYSTEMS:   Constitutional: Denies fevers, chills or abnormal weight loss Eyes: Denies blurriness of vision Ears, nose, mouth, throat, and face: Denies mucositis or sore throat Respiratory: Denies cough, dyspnea or wheezes Cardiovascular: Denies palpitation, chest discomfort Gastrointestinal: Denies nausea, heartburn or change in bowel habits Skin: Denies abnormal skin rashes Lymphatics: Denies new lymphadenopathy or easy bruising Neurological: Denies numbness, tingling or new weaknesses Behavioral/Psych: Mood is stable, no new changes  Extremities: No lower extremity edema Breast: denies any pain or lumps or nodules in either breasts All other systems were reviewed with the patient and are negative.  I have reviewed the past medical history, past surgical history, social history and family history with the patient and they are unchanged from previous note.  ALLERGIES:  has No Known Allergies.  MEDICATIONS:  Current Outpatient Medications  Medication Sig Dispense Refill  . tamoxifen (NOLVADEX) 20 MG tablet Take 1 tablet (20 mg total) by mouth daily. 90 tablet 3   No current facility-administered medications for this visit.     PHYSICAL EXAMINATION: ECOG PERFORMANCE STATUS: 1 - Symptomatic but completely ambulatory  Vitals:   08/16/19 1150  BP: 109/76  Pulse: 92  Resp: 20  Temp: 98.9 F (37.2 C)  SpO2: 100%   Filed Weights  08/16/19 1150  Weight: 108 lb 14.4 oz (49.4 kg)    GENERAL: alert, no distress and comfortable SKIN: skin color, texture, turgor are normal, no rashes or  significant lesions EYES: normal, Conjunctiva are pink and non-injected, sclera clear OROPHARYNX: no exudate, no erythema and lips, buccal mucosa, and tongue normal  NECK: supple, thyroid normal size, non-tender, without nodularity LYMPH: no palpable lymphadenopathy in the cervical, axillary or inguinal LUNGS: clear to auscultation and percussion with normal breathing effort HEART: regular rate & rhythm and no murmurs and no lower extremity edema ABDOMEN: abdomen soft, non-tender and normal bowel sounds MUSCULOSKELETAL: no cyanosis of digits and no clubbing  NEURO: alert & oriented x 3 with fluent speech, no focal motor/sensory deficits EXTREMITIES: No lower extremity edema  LABORATORY DATA:  I have reviewed the data as listed CMP Latest Ref Rng & Units 05/10/2019 05/04/2019 04/27/2019  Glucose 70 - 99 mg/dL 103(H) 103(H) 93  BUN 6 - 20 mg/dL 14 11 15   Creatinine 0.44 - 1.00 mg/dL 0.64 0.66 0.67  Sodium 135 - 145 mmol/L 139 138 139  Potassium 3.5 - 5.1 mmol/L 3.9 3.7 3.8  Chloride 98 - 111 mmol/L 103 105 105  CO2 22 - 32 mmol/L 26 24 23   Calcium 8.9 - 10.3 mg/dL 9.1 9.1 9.2  Total Protein 6.5 - 8.1 g/dL 7.3 7.2 7.7  Total Bilirubin 0.3 - 1.2 mg/dL 0.4 0.3 0.3  Alkaline Phos 38 - 126 U/L 115 125 123  AST 15 - 41 U/L 53(H) 66(H) 68(H)  ALT 0 - 44 U/L 75(H) 94(H) 71(H)    Lab Results  Component Value Date   WBC 3.9 (L) 06/05/2019   HGB 12.9 06/05/2019   HCT 41.8 06/05/2019   MCV 88.6 06/05/2019   PLT 195 06/05/2019   NEUTROABS 2.1 06/05/2019    ASSESSMENT & PLAN:  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 Adjuvant radiation therapy because of the tumor size being large: Started 05/31/2019 3. Followed by adjuvant antiestrogen therapy with tamoxifen 20 mg daily x10 years started 07/10/2019 ------------------------------------------------------------------------------------------------------------------- Tamoxifen toxicities: 1.  Muscle aches and pains especially in the wrists and hands: I instructed her to decrease the dosage of tamoxifen to 10 mg daily. 2.  Hot flashes  Return to clinic in 3 months for survivorship care plan visit and at that visit we can set her up for her mammograms.    No orders of the defined types were placed in this encounter.  The patient has a good understanding of the overall plan. she agrees with it. she will call with any problems that may develop before the next visit here.  Nicholas Lose, MD 08/16/2019  Julious Oka Dorshimer am acting as scribe for Dr. Nicholas Lose.  I have reviewed the above documentation for accuracy and completeness, and I agree with the above.

## 2019-08-16 ENCOUNTER — Inpatient Hospital Stay: Payer: BC Managed Care – PPO | Attending: Hematology and Oncology | Admitting: Hematology and Oncology

## 2019-08-16 ENCOUNTER — Other Ambulatory Visit: Payer: Self-pay

## 2019-08-16 DIAGNOSIS — Z79810 Long term (current) use of selective estrogen receptor modulators (SERMs): Secondary | ICD-10-CM | POA: Diagnosis not present

## 2019-08-16 DIAGNOSIS — Z170 Estrogen receptor positive status [ER+]: Secondary | ICD-10-CM | POA: Insufficient documentation

## 2019-08-16 DIAGNOSIS — Z923 Personal history of irradiation: Secondary | ICD-10-CM | POA: Diagnosis not present

## 2019-08-16 DIAGNOSIS — Z9221 Personal history of antineoplastic chemotherapy: Secondary | ICD-10-CM | POA: Diagnosis not present

## 2019-08-16 DIAGNOSIS — C50011 Malignant neoplasm of nipple and areola, right female breast: Secondary | ICD-10-CM | POA: Insufficient documentation

## 2019-08-16 DIAGNOSIS — Z9011 Acquired absence of right breast and nipple: Secondary | ICD-10-CM | POA: Diagnosis not present

## 2019-08-16 NOTE — Assessment & Plan Note (Addendum)
09/30/2018:Right mastectomy with axillary lymph node dissection in [place]: Grade 2 IDC, 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 Adjuvant radiation therapy because of the tumor size being large: Started 05/31/2019 3. Followed by adjuvant antiestrogen therapy with tamoxifen 20 mg daily x10 years started 07/10/2019 ------------------------------------------------------------------------------------------------------------------- Tamoxifen toxicities: 1.  Muscle aches and pains especially in the wrists and hands: I instructed her to decrease the dosage of tamoxifen to 10 mg daily. 2.  Hot flashes  Return to clinic in 3 months for survivorship care plan visit and at that visit we can set her up for her mammograms.

## 2019-10-10 ENCOUNTER — Telehealth: Payer: Self-pay | Admitting: Adult Health

## 2019-10-10 NOTE — Telephone Encounter (Signed)
I called interpreters regarding video visit and left a message

## 2019-10-11 ENCOUNTER — Encounter: Payer: Self-pay | Admitting: Adult Health

## 2019-10-19 ENCOUNTER — Encounter: Payer: BC Managed Care – PPO | Admitting: Adult Health

## 2019-11-01 ENCOUNTER — Encounter: Payer: Self-pay | Admitting: Adult Health

## 2019-11-01 ENCOUNTER — Other Ambulatory Visit: Payer: Self-pay

## 2019-11-01 ENCOUNTER — Telehealth: Payer: BC Managed Care – PPO | Admitting: Adult Health

## 2019-11-01 ENCOUNTER — Inpatient Hospital Stay: Payer: BC Managed Care – PPO | Attending: Hematology and Oncology | Admitting: Adult Health

## 2019-11-01 DIAGNOSIS — Z170 Estrogen receptor positive status [ER+]: Secondary | ICD-10-CM | POA: Diagnosis not present

## 2019-11-01 DIAGNOSIS — C50011 Malignant neoplasm of nipple and areola, right female breast: Secondary | ICD-10-CM | POA: Insufficient documentation

## 2019-11-01 NOTE — Progress Notes (Signed)
CLINIC:  Survivorship   REASON FOR VISIT:  Routine follow-up post-treatment for a recent history of breast cancer.  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, estrogen receptor positive ([place])  09/30/2018 Surgery   Right mastectomy with axillary lymph node dissection in [place]: Grade 2 IDC, 5.3 cm, DCIS, margins negative, lymphovascular invasion present, 0/18 lymph nodes, ER 60%, PR 0%, HER-2 negative, Ki-67 90%, T3N0 stage II B   11/15/2018 Cancer Staging   Staging form: Breast, AJCC 8th Edition - Pathologic: Stage IIB (pT3, pN0, cM0, G2, ER+, PR-, HER2-) - Signed by Nicholas Lose, MD on 11/15/2018   11/29/2018 Oncotype testing   Oncotype DX score 28: 17% risk of distant recurrence at 9 years   12/08/2018 Genetic Testing   Negative genetic testing on the common hereditary cancer panel.  The Hereditary Gene Panel offered by [facility] includes sequencing and/or deletion duplication testing of the following 47 genes: APC, ATM, AXIN2, BARD1, BMPR1A, BRCA1, BRCA2, BRIP1, CDH1, CDK4, CDKN2A (p14ARF), CDKN2A (p16INK4a), CHEK2, CTNNA1, DICER1, EPCAM (Deletion/duplication testing only), GREM1 (promoter region deletion/duplication testing only), KIT, MEN1, MLH1, MSH2, MSH3, MSH6, MUTYH, NBN, NF1, NHTL1, PALB2, PDGFRA, PMS2, POLD1, POLE, PTEN, RAD50, RAD51C, RAD51D, SDHB, SDHC, SDHD, SMAD4, SMARCA4. STK11, TP53, TSC1, TSC2, and VHL.  The following genes were evaluated for sequence changes only: SDHA and HOXB13 c.251G>A variant only. The report date is December 08, 2018.   12/29/2018 -  Chemotherapy   Adjuvant chemotherapy with dose dense Adriamycin and Cytoxan followed by Taxol.     05/31/2019 - 07/11/2019 Radiation Therapy   The patient initially received a dose of 50 Gy in 25 fractions to the breast  and 45 Gy in 25 fractions to the scv using whole-breast tangent fields. This was delivered using a 3-D conformal technique. The  pt received a boost delivering an additional 10 Gy in 5 fractions using a electron boost with 70mV electrons. The total dose was 105 Gy.    07/2019 - 07/2029 Anti-estrogen oral therapy   Tamoxifen 276mdaily     INTERVAL HISTORY:  [facility] to review her survivorship care plan detailing her treatment course for breast cancer, as well as monitoring long-term side effects of that treatment, education regarding health maintenance, screening, and overall wellness and health promotion.   We used [employer] [employer] [place]pre[employer] from [employer].    Overall, Ariana Luna feeling quite well.  She is taking one half of a pill daily.  She was having arthralgias, and was unable to tolerate full dose.      REVIEW OF SYSTEMS:  Review of Systems  Constitutional: Negative for appetite change, chills, fatigue, fever and unexpected weight change.  HENT:   Negative for hearing loss, lump/mass, sore throat and trouble swallowing.   Eyes: Negative for eye problems and icterus.  Respiratory: Negative for chest tightness, cough and shortness of breath.   Cardiovascular: Negative for chest pain, leg swelling and palpitations.  Gastrointestinal: Negative for abdominal distention, abdominal pain, constipation, diarrhea, nausea and vomiting.  Endocrine: Negative for hot flashes.  Musculoskeletal: Negative for arthralgias.  Skin: Negative for itching and rash.  Neurological: Negative for dizziness, extremity weakness, headaches and numbness.  Hematological: Negative for adenopathy. Does not bruise/bleed easily.  Psychiatric/Behavioral: Negative for depression. The patient is not nervous/anxious.   Breast: Denies any new nodularity, masses, tenderness, nipple changes, or nipple discharge.    ONCOLOGY  TREATMENT TEAM:  1. Surgeon: Surgery in [place] 2. Medical Oncologist: Dr. Lindi Adie  3. Radiation Oncologist: Dr. Isidore Moos    PAST MEDICAL/SURGICAL  HISTORY:  Past Medical History:  Diagnosis Date  . Breast cancer ([place])   . PONV (postoperative nausea and vomiting)    Past Surgical History:  Procedure Laterality Date  . IR IMAGING GUIDED PORT INSERTION  12/28/2018  . IR REMOVAL TUN ACCESS W/ PORT W/O FL MOD SED  06/05/2019  . MASTECTOMY Right      ALLERGIES:  No Known Allergies   CURRENT MEDICATIONS:  Outpatient Encounter Medications as of 11/01/2019  Medication Sig  . tamoxifen (NOLVADEX) 20 MG tablet Take 1 tablet (20 mg total) by mouth daily.   No facility-administered encounter medications on file as of 11/01/2019.     ONCOLOGIC FAMILY HISTORY:  Non contributory   GENETIC COUNSELING/TESTING: See above  SOCIAL HISTORY:  Social History   Socioeconomic History  . Marital status: Married    Spouse name: Not on file  . Number of children: Not on file  . Years of education: Not on file  . Highest education level: Not on file  Occupational History  . Not on file  Tobacco Use  . Smoking status: Never Smoker  . Smokeless tobacco: Never Used  Substance and Sexual Activity  . Alcohol use: Not Currently  . Drug use: Never  . Sexual activity: Not on file  Other Topics Concern  . Not on file  Social History Narrative  . Not on file   Social Determinants of Health   Financial Resource Strain:   . Difficulty of Paying Living Expenses: Not on file  Food Insecurity:   . Worried About Charity fundraiser in the Last Year: Not on file  . Ran Out of Food in the Last Year: Not on file  Transportation Needs: No Transportation Needs  . Lack of Transportation (Medical): No  . Lack of Transportation (Non-Medical): No  Physical Activity:   . Days of Exercise per Week: Not on file  . Minutes of Exercise per Session: Not on file  Stress:   . Feeling of Stress : Not on file  Social Connections:   . Frequency of Communication with Friends and Family: Not on file  . Frequency of Social Gatherings with Friends and  Family: Not on file  . Attends Religious Services: Not on file  . Active Member of Clubs or Organizations: Not on file  . Attends Archivist Meetings: Not on file  . Marital Status: Not on file  Intimate Partner Violence: Not At Risk  . Fear of Current or Ex-Partner: No  . Emotionally Abused: No  . Physically Abused: No  . Sexually Abused: No     PHYSICAL EXAMINATION:  Vital Signs:  Patient came in person instead of virtual  Tech was unable to place vitals in [facility].  These are vitals from today: Weight 107.9 lbs, BP 103/84, HR 74, RR 18, T 98.9, Oxygen saturation 100% General: Well-nourished, well-appearing female in no acute distress.  She is unaccompanied today.   HEENT: Head is normocephalic.  Pupils equal and reactive to light. Conjunctivae clear without exudate.  Sclerae anicteric. Oral mucosa is pink, moist.  Oropharynx is pink without lesions or erythema.  Lymph: No cervical, supraclavicular, or infraclavicular lymphadenopathy noted on palpation.  Cardiovascular: Regular rate and rhythm.Marland Kitchen Respiratory: Clear to auscultation bilaterally. Chest expansion symmetric; breathing non-labored.  Breast: right breast s/p mastectomy and radiation, no sign of local recurrence, left  breast benign GI: Abdomen soft and round; non-tender, non-distended. Bowel sounds normoactive.  GU: Deferred.  Neuro: No focal deficits. Steady gait.  Psych: Mood and affect normal and appropriate for situation.  Extremities: No edema. MSK: No focal spinal tenderness to palpation.  Full range of motion in bilateral upper extremities Skin: Warm and dry.  LABORATORY DATA:  None for this visit.  DIAGNOSTIC IMAGING:  None for this visit.      ASSESSMENT AND PLAN:  Ms.. Luna is a pleasant 43 y.o. female with Stage IIB right breast invasive ductal carcinoma, ER+/PR-/HER2-, diagnosed in 09/2018, treated with mastectomy, adjuvant chemotherapy, adjuvant radiation therapy, and anti-estrogen therapy  with Tamoixfen beginning in 07/2019.  She presents to the Survivorship Clinic for our initial meeting and routine follow-up post-completion of treatment for breast cancer.    1. Stage IIB right breast cancer:  Ariana Luna is continuing to recover from definitive treatment for breast cancer. She will follow-up with her medical oncologist, Dr. Lindi Adie with history and physical exam per surveillance protocol.  She will continue her anti-estrogen therapy with Tamoxifen. Thus far, she is tolerating the Tamoxifen well at half dose due to arthralgias and will continue this.  She is past due for left breast diagnostic mammogram and ultrasound.  This was recommended to be completed in 05/2019.  I placed orders for this to be completed as soon as possible. Today, a comprehensive survivorship care plan and treatment summary was reviewed with the patient today detailing her breast cancer diagnosis, treatment course, potential late/long-term effects of treatment, appropriate follow-up care with recommendations for the future, and patient education resources.  A copy of this summary, along with a letter will be sent to the patient's primary care provider via mail/fax/In Basket message after today's visit.    2. Bone health: She was given education on specific activities to promote bone health.  3. Cancer screening:  Due to Ariana Luna's history and her age, she should receive screening for skin cancers, colon cancer, and gynecologic cancers.  The information and recommendations are listed on the patient's comprehensive care plan/treatment summary and were reviewed in detail with the patient.    4. Health maintenance and wellness promotion: Ariana Luna was encouraged to consume 5-7 servings of fruits and vegetables per day. We reviewed the "Nutrition Rainbow" handout, as well as the handout "Take Control of Your Health and Reduce Your Cancer Risk" from the [place].  She was also encouraged to engage in  moderate to vigorous exercise for 30 minutes per day most days of the week.  She was instructed to limit her alcohol consumption and continue to abstain from tobacco use.     5. Support services/counseling: It is not uncommon for this period of the patient's cancer care trajectory to be one of many emotions and stressors. This can be compounded with COVID 19. I reviewed our virtual platform for support.    Dispo:   -Return to cancer center in 6 months for f/u with Dr. Lindi Adie  -Mammogram due -She is welcome to return back to the Survivorship Clinic at any time; no additional follow-up needed at this time.  -Consider referral back to survivorship as a long-term survivor for continued surveillance  A total of (25) minutes of face-to-face time was spent with this patient with greater than 50% of that time in counseling and care-coordination.   Gardenia Phlegm, NP Survivorship Program [place] [phone]   Note: PRIMARY CARE PROVIDER Newton Pigg, [place] [phone]

## 2019-11-15 ENCOUNTER — Telehealth: Payer: Self-pay

## 2019-11-15 NOTE — Telephone Encounter (Signed)
RN returned call, voicemail left for return call.  

## 2020-02-06 ENCOUNTER — Other Ambulatory Visit: Payer: Self-pay | Admitting: Adult Health

## 2020-02-06 DIAGNOSIS — Z170 Estrogen receptor positive status [ER+]: Secondary | ICD-10-CM

## 2020-02-06 DIAGNOSIS — C50011 Malignant neoplasm of nipple and areola, right female breast: Secondary | ICD-10-CM

## 2020-02-25 ENCOUNTER — Other Ambulatory Visit: Payer: BC Managed Care – PPO

## 2020-02-26 ENCOUNTER — Other Ambulatory Visit: Payer: Self-pay | Admitting: Adult Health

## 2020-02-26 ENCOUNTER — Other Ambulatory Visit: Payer: Self-pay

## 2020-02-26 ENCOUNTER — Ambulatory Visit
Admission: RE | Admit: 2020-02-26 | Discharge: 2020-02-26 | Disposition: A | Discharge status: Home / Self Care | Payer: BC Managed Care – PPO | Source: Ambulatory Visit | Attending: Adult Health | Admitting: Adult Health

## 2020-02-26 DIAGNOSIS — C50011 Malignant neoplasm of nipple and areola, right female breast: Secondary | ICD-10-CM

## 2020-02-26 DIAGNOSIS — Z170 Estrogen receptor positive status [ER+]: Secondary | ICD-10-CM

## 2020-02-26 DIAGNOSIS — R921 Mammographic calcification found on diagnostic imaging of breast: Secondary | ICD-10-CM

## 2020-02-26 IMAGING — MG MM DIGITAL DIAGNOSTIC UNILAT*L* W/ TOMO W/ CAD
6 of 9 series · 6 of 21 positions shown · non-contrast
Comparison: Previous exam(s).

CLINICAL DATA: 43-year-old female presenting for annual exam as
well as follow-up of probably benign left breast masses. Patient had
a benign breast biopsy in [DATE] demonstrating fibroadenoma.
History of right mastectomy in [DATE].

EXAM:
DIGITAL DIAGNOSTIC LEFT MAMMOGRAM WITH CAD
ULTRASOUND LEFT BREAST

[L ML (1 of 2)]
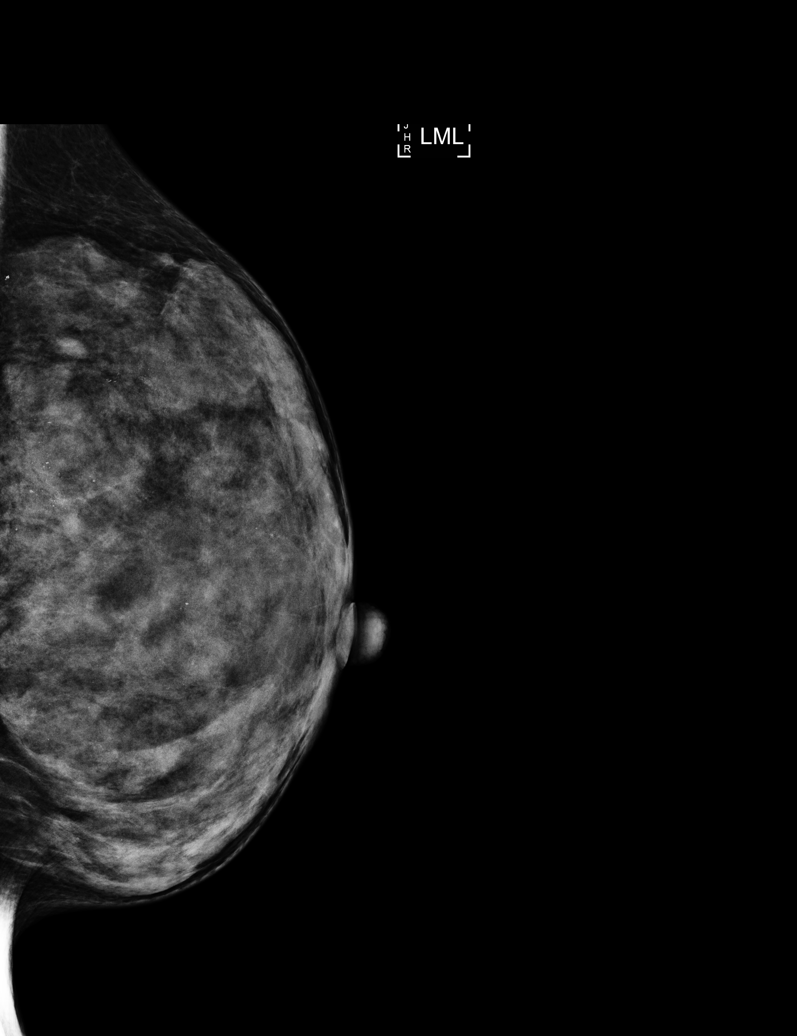

[L ML (2 of 2)]
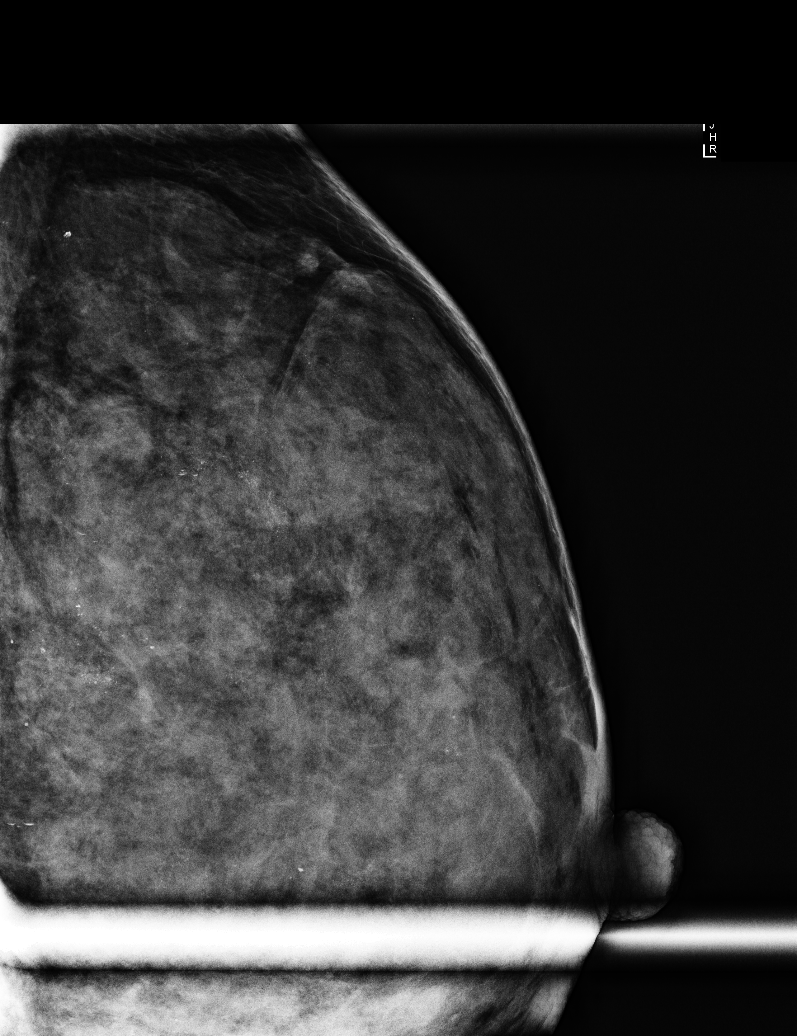

[L CC]
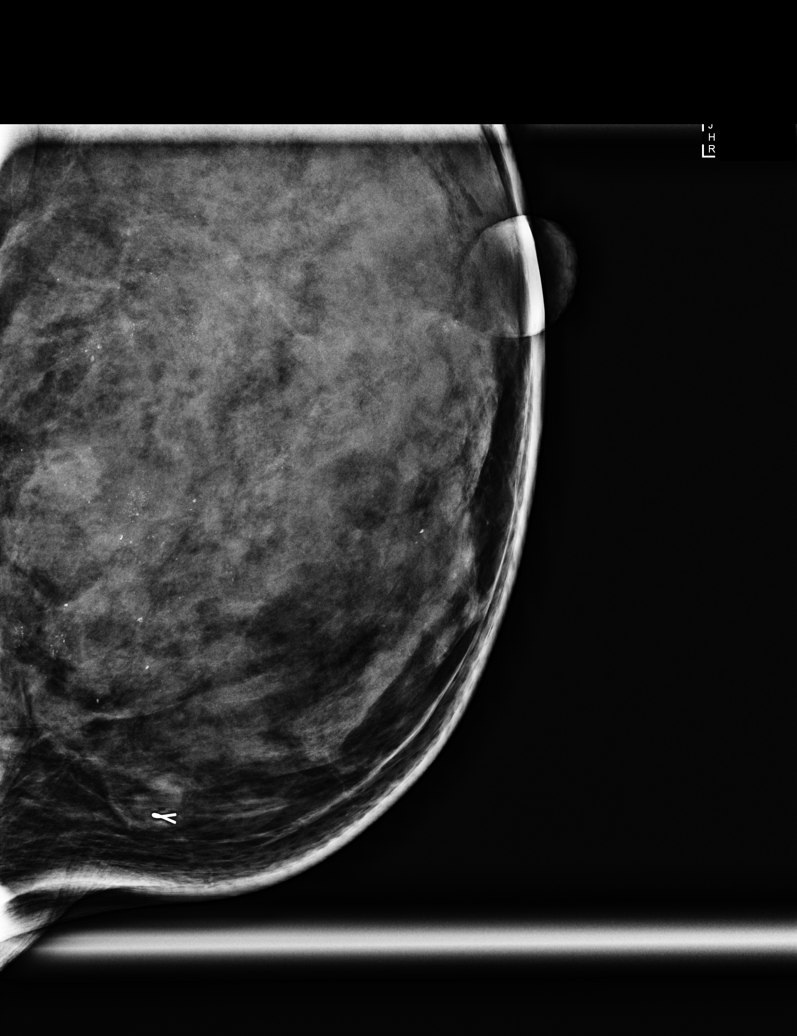

[L CC synth-2D (1 of 2)]
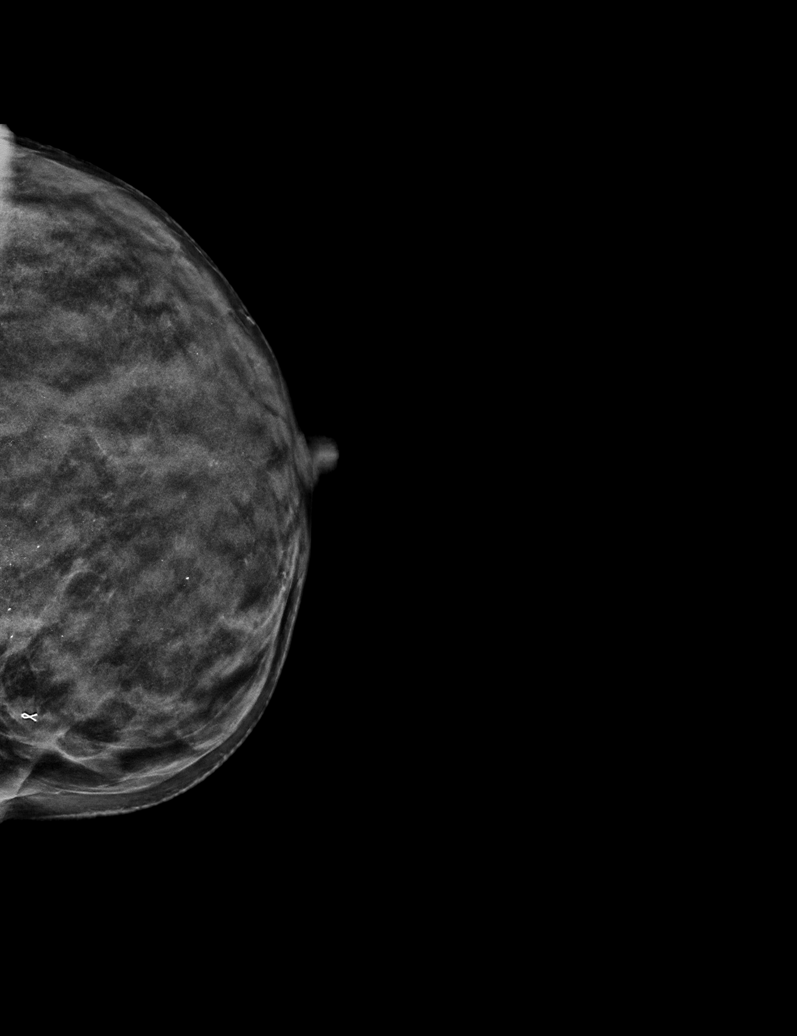

[L CC synth-2D (2 of 2)]
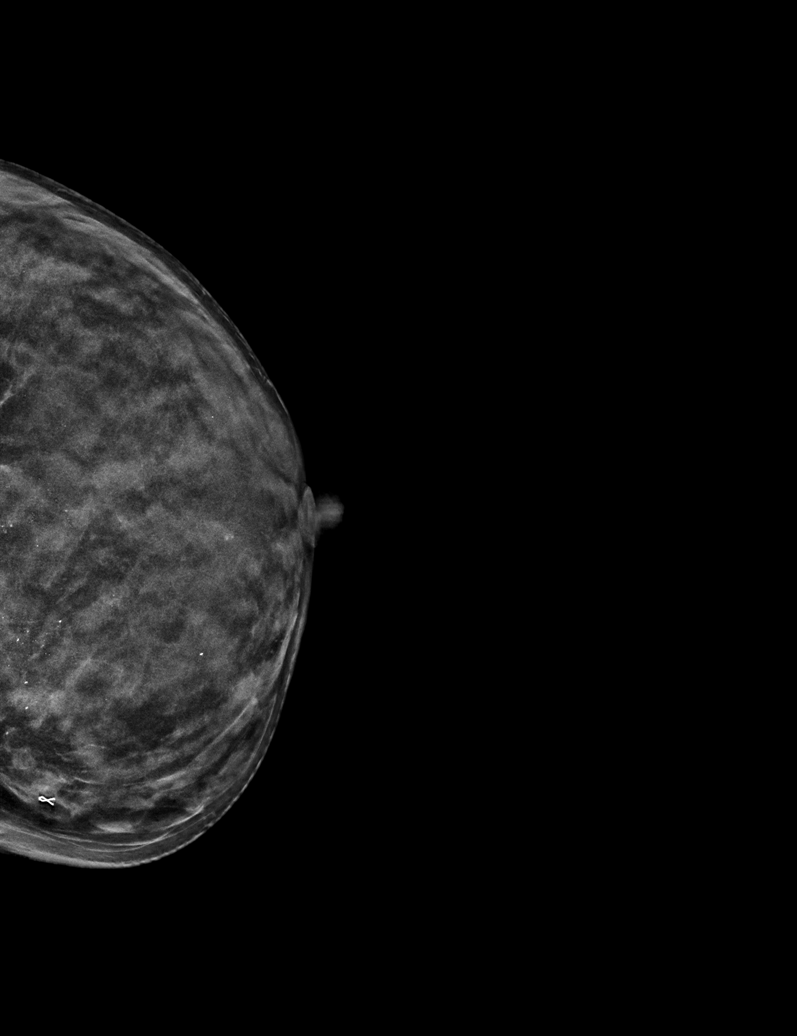

[L MLO synth-2D]
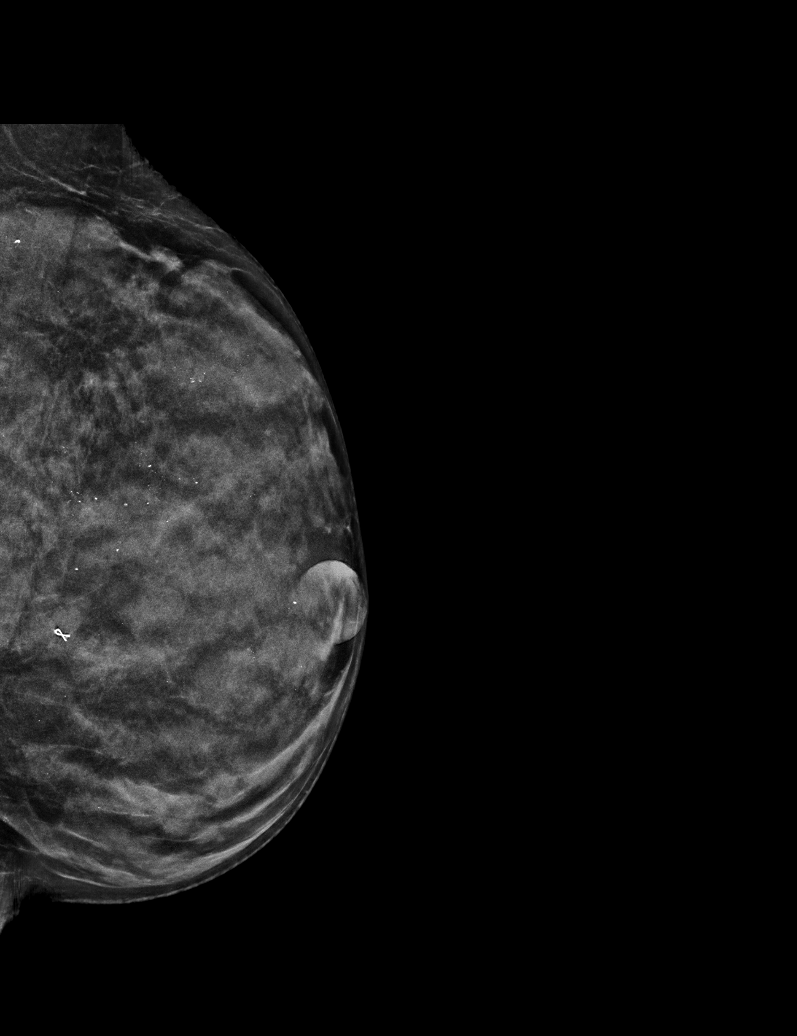

[6 of 21 positions shown; findings below may reference images not displayed]

ACR Breast Density Category d: The breast tissue is extremely dense,
which lowers the sensitivity of mammography.
FINDINGS: Mammogram:

Left breast: There is a biopsy marking clip in the medial left
breast at the site of prior biopsy. There are scattered and
occasionally grouped round and amorphous calcifications throughout
the upper inner left breast mid to posterior depth. Several of these
demonstrate layering on lateral view consistent with benign milk of
calcium. The calcifications overall span up to 6 cm. There are no
additional new findings in the left breast.

Mammographic images were processed with CAD.

Ultrasound:

Targeted ultrasound is performed in the left breast at 10 o'clock 7
cm from the nipple demonstrating an oval circumscribed hypoechoic
mass measuring 0.5 x 0.3 x 0.6 cm. This represents the biopsy-proven
fibroadenoma.

Targeted ultrasound of the left breast at 3 o'clock 6 cm from the
nipple demonstrates an oval circumscribed hypoechoic mass measuring
1.0 x 0.2 x 0.9 cm, previously measuring 1.1 x 0.3 x 0.8 cm.

Targeted ultrasound of the left breast at 10 o'clock 2 cm from the
nipple demonstrates resolution of the previously seen cystic mass in
this location thought to represent a cluster of cysts.
IMPRESSION: 1. Left breast scattered and occasionally grouped calcifications
throughout the upper inner quadrant spanning up to 6 cm are
indeterminate. Some groups demonstrate layering on the lateral view
consistent with benign milk of calcium.

2. Stable appearance of a probably benign mass in the left breast at
3 o'clock 6 cm from the nipple likely representing a fibroadenoma.

3. Resolution of the previously seen probably benign mass in the
left breast at 10 o'clock, which was likely a cluster of cysts.

RECOMMENDATION:
1. Stereotactic core needle biopsy of 1 of the larger groups of
calcifications in the left breast. If the biopsy returns benign,
recommend six-month follow-up for the remaining groups of
calcifications in the left breast. If the biopsy returns as atypia
or malignancy, recommend stereotactic guided core needle biopsy of
an additional group of calcification to determine extent.

2. Left breast ultrasound in [DATE] to ensure two year
stability of the probably benign left breast mass at 3 o'clock.
However if the biopsy calcification return as atypia or malignancy,
recommend ultrasound-guided core needle biopsy of this mass.

3. Consider breast MRI given personal history of breast cancer and
extremely dense breasts.

I have discussed the findings and recommendations with the patient.
If applicable, a reminder letter will be sent to the patient
regarding the next appointment.

BI-RADS CATEGORY  4: Suspicious.

## 2020-02-29 ENCOUNTER — Other Ambulatory Visit: Payer: Self-pay | Admitting: Adult Health

## 2020-02-29 DIAGNOSIS — C50011 Malignant neoplasm of nipple and areola, right female breast: Secondary | ICD-10-CM

## 2020-02-29 DIAGNOSIS — Z170 Estrogen receptor positive status [ER+]: Secondary | ICD-10-CM

## 2020-02-29 NOTE — Progress Notes (Signed)
MRI breast ordered for patient breast density D per radiology recommendations.  Will alternate 6 months after mammogram, however if breast concern arises, will get sooner.   Wilber Bihari, NP

## 2020-03-17 ENCOUNTER — Other Ambulatory Visit: Payer: Self-pay

## 2020-03-17 ENCOUNTER — Ambulatory Visit
Admission: RE | Admit: 2020-03-17 | Discharge: 2020-03-17 | Disposition: A | Discharge status: Home / Self Care | Payer: BC Managed Care – PPO | Source: Ambulatory Visit | Attending: Adult Health | Admitting: Adult Health

## 2020-03-17 DIAGNOSIS — R921 Mammographic calcification found on diagnostic imaging of breast: Secondary | ICD-10-CM

## 2020-03-17 IMAGING — MG MM BREAST LOCALIZATION CLIP
4 series · 4 of 12 positions shown · non-contrast
Comparison: Previous exam(s).

CLINICAL DATA: 43-year-old female presenting for biopsy of left
breast calcifications.

EXAM:
DIAGNOSTIC LEFT MAMMOGRAM POST STEREOTACTIC BIOPSY

[L CC synth-2D]
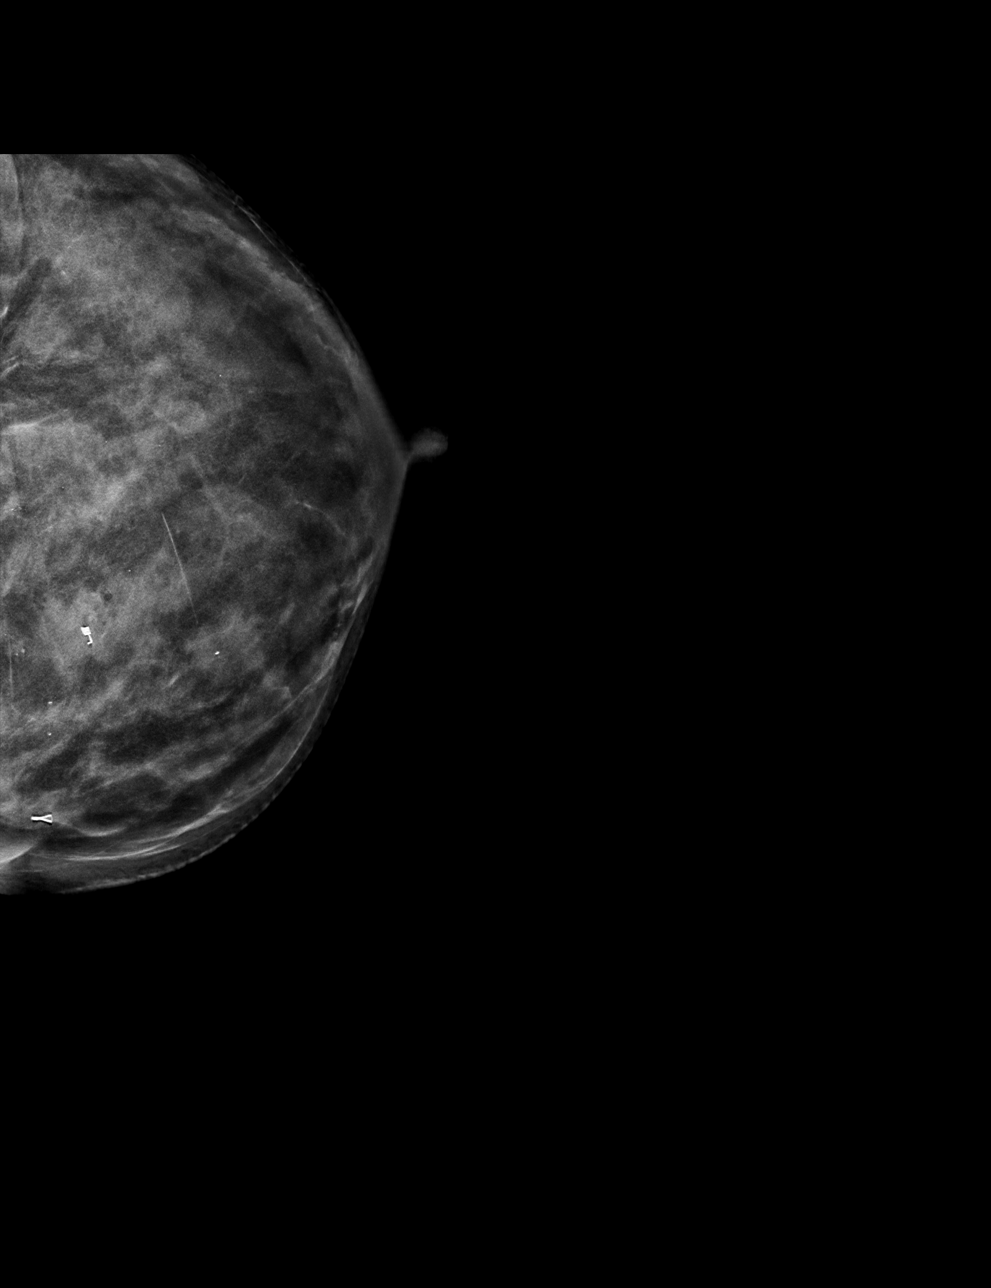

[L ML synth-2D]
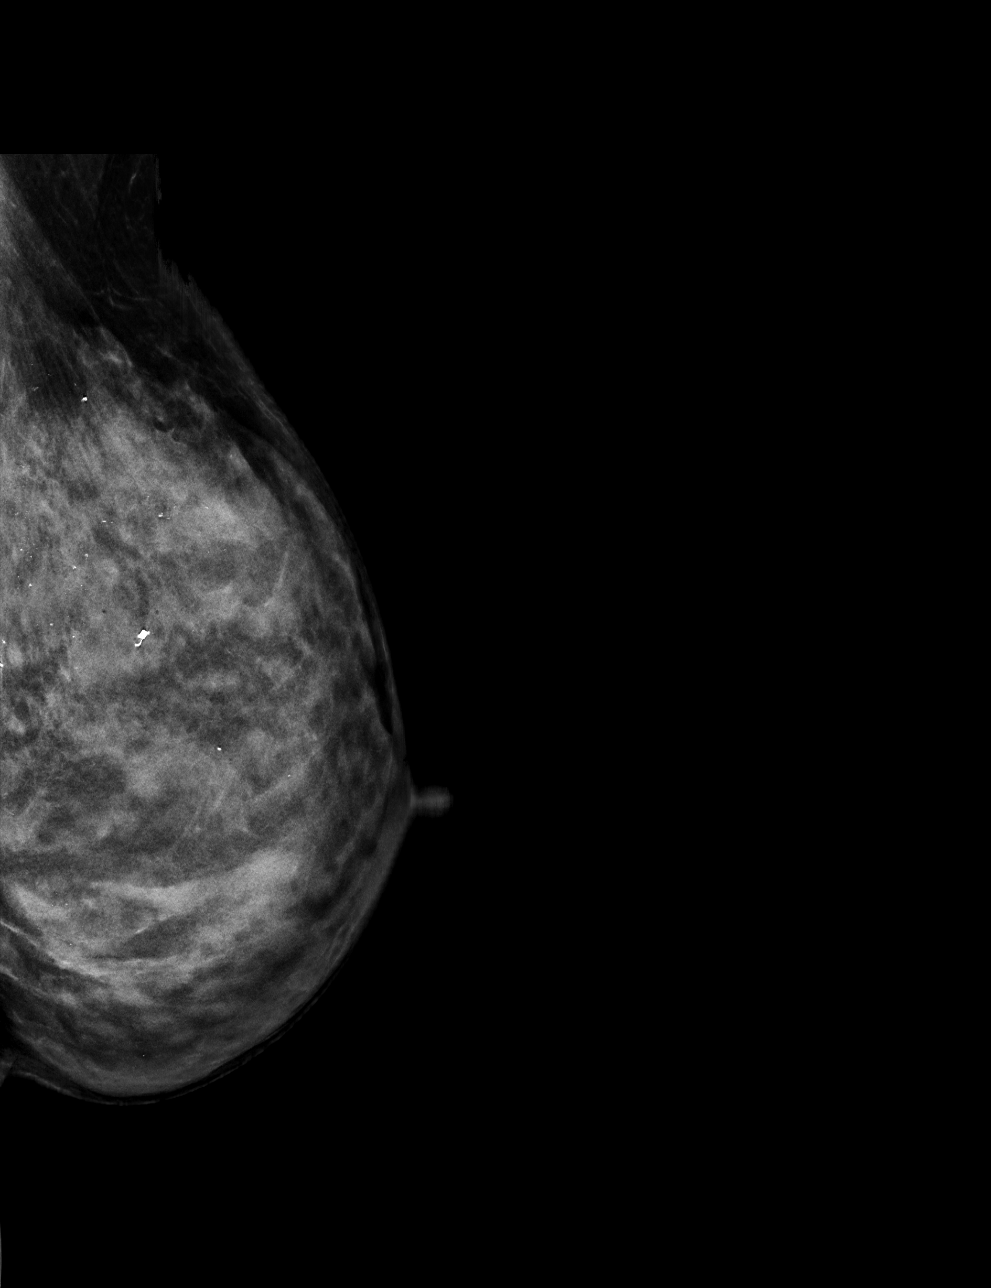

[L CC tomo · tomo slice 29/56.0]
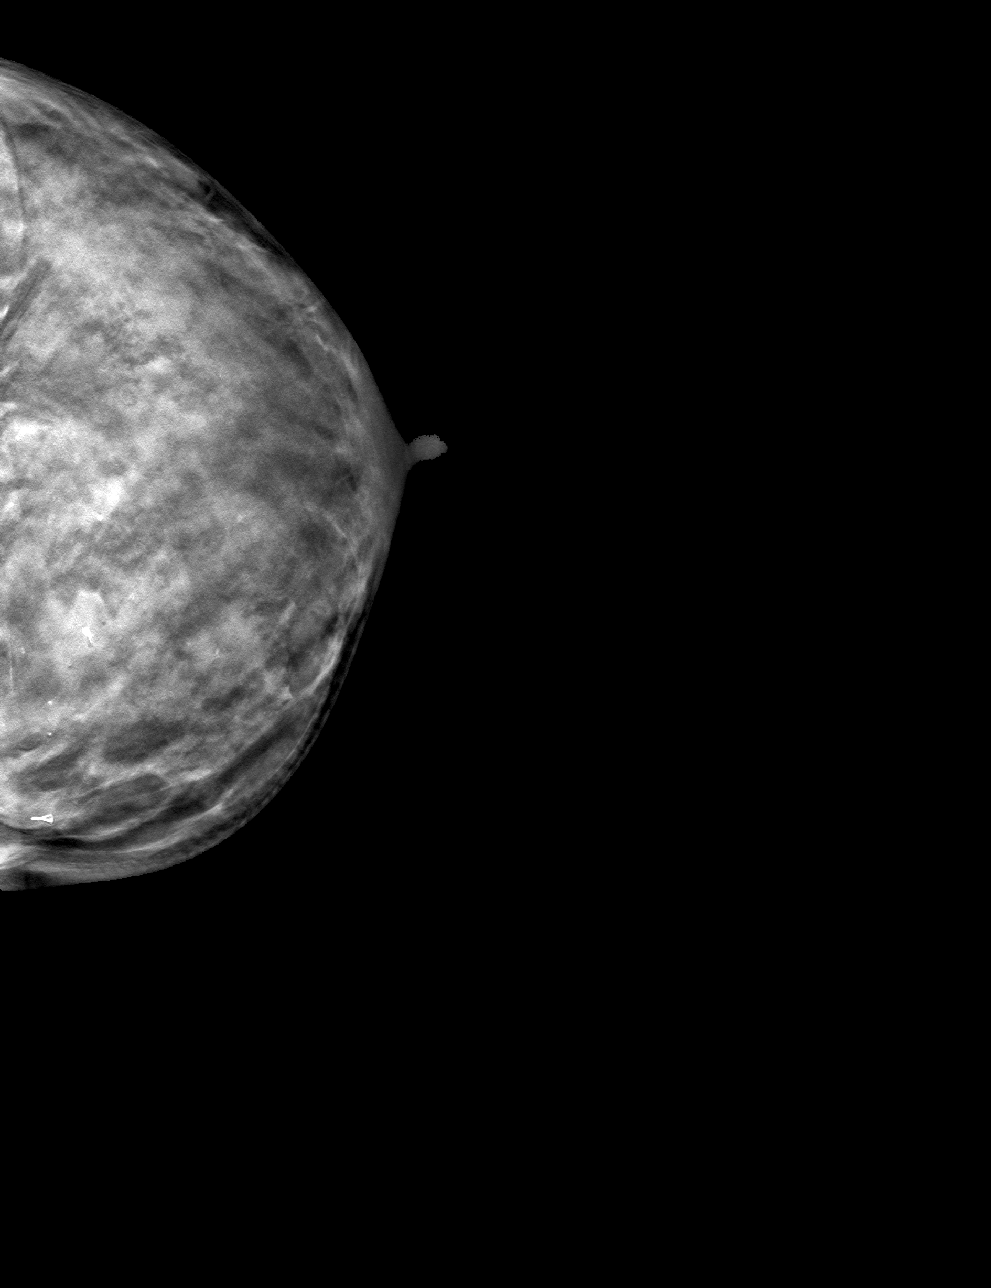

[L ML tomo · tomo slice 29/56.0]
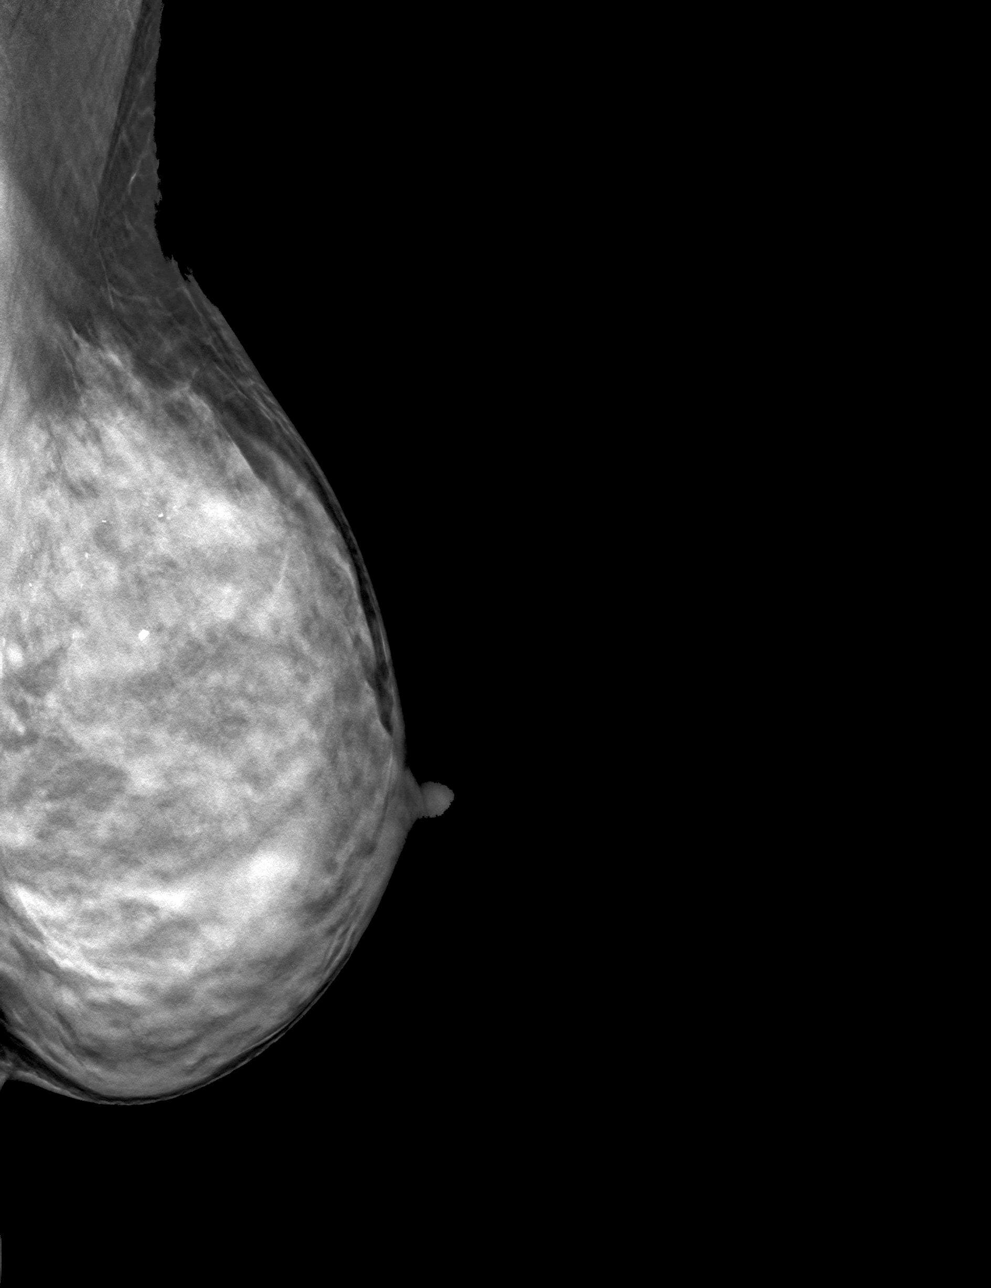

[4 of 12 positions shown; findings below may reference images not displayed]

FINDINGS: Mammographic images were obtained following stereotactic guided
biopsy of calcifications in the upper inner quadrant. The biopsy
marking clip is in expected position at the site of biopsy.
IMPRESSION: Appropriate positioning of the coil shaped biopsy marking clip at
the site of biopsy in the upper inner quadrant of the left breast.

Final Assessment: Post Procedure Mammograms for Marker Placement

## 2020-03-17 IMAGING — MG MM BREAST BX W LOC DEV 1ST LESION IMAGE BX SPEC STEREO GUIDE*L*
8 of 18 series · 8 of 40 positions shown · non-contrast
Comparison: Previous exams.
COMPARISON: Previous exams.

Addendum:
CLINICAL DATA: 43-year-old female presenting for biopsy of
calcifications in the left breast.

EXAM:
LEFT BREAST STEREOTACTIC CORE NEEDLE BIOPSY

[L (1 of 8)]
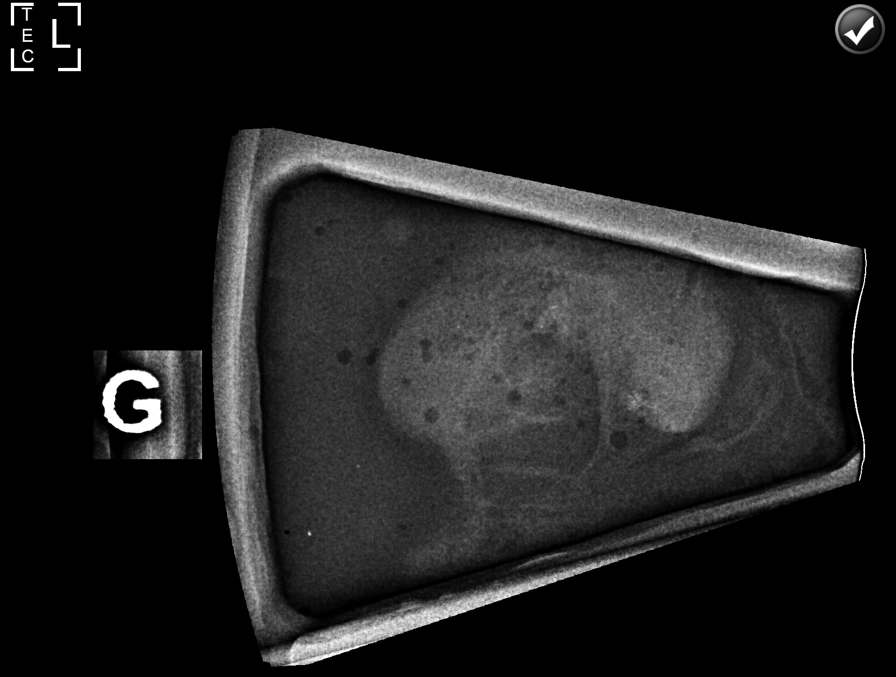

[L (2 of 8)]
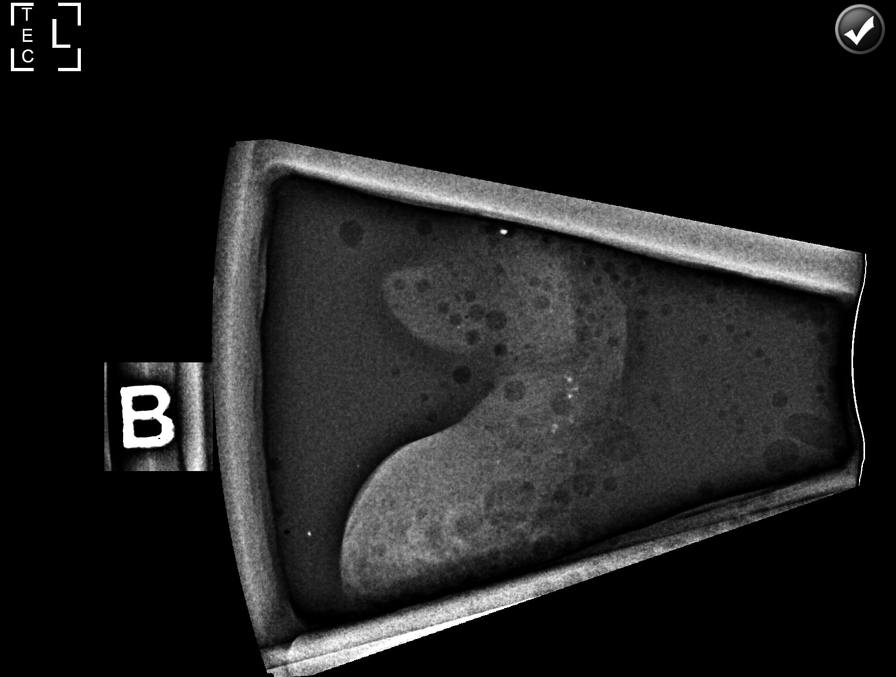

[L (3 of 8)]
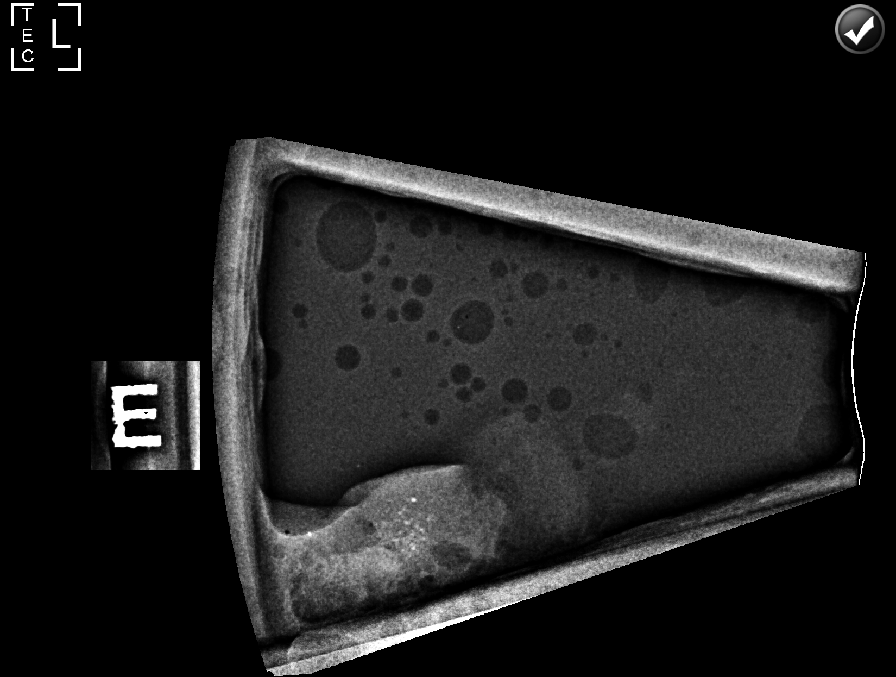

[L (4 of 8)]
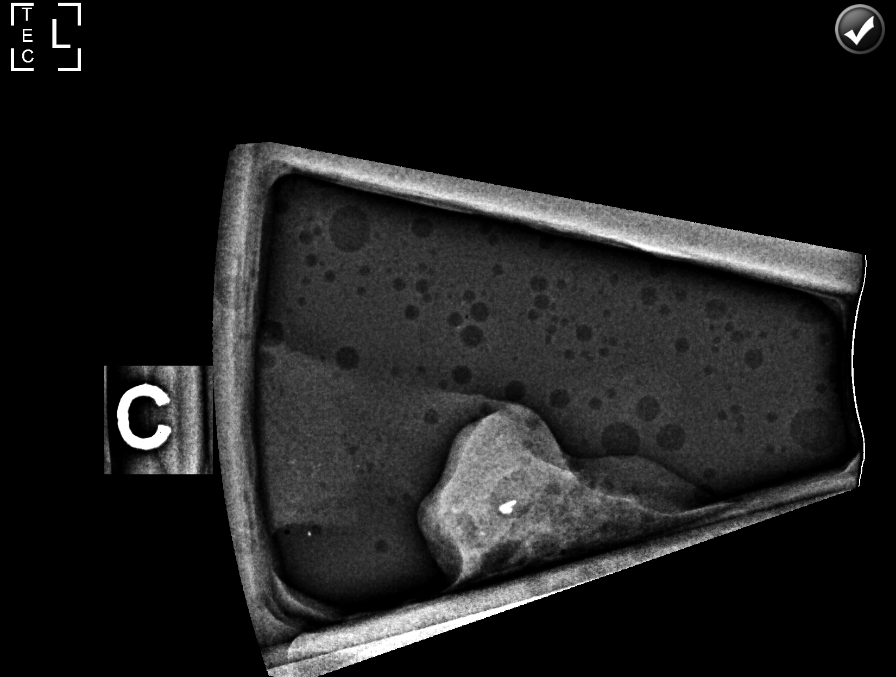

[L (5 of 8)]
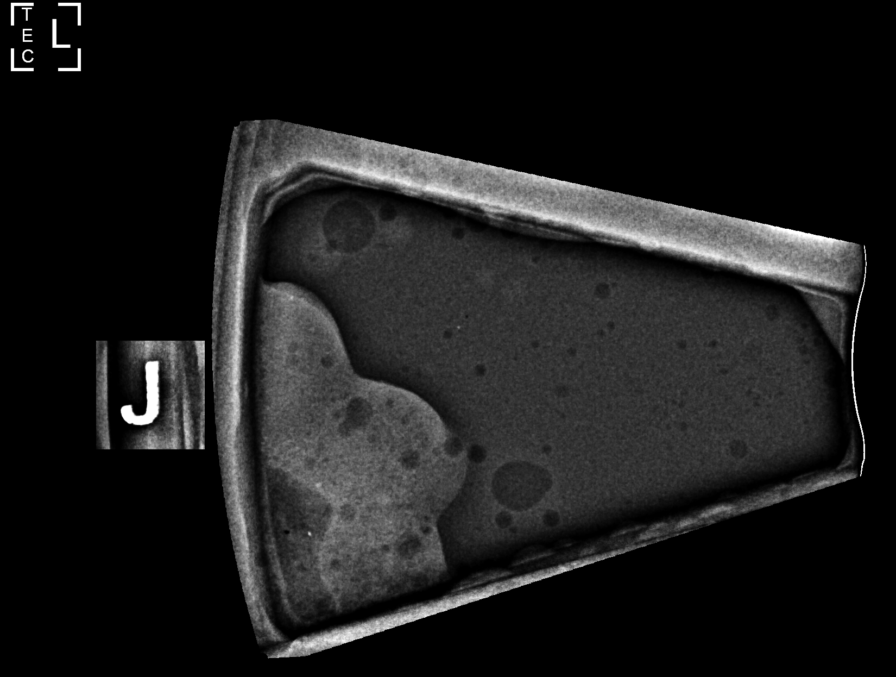

[L (6 of 8)]
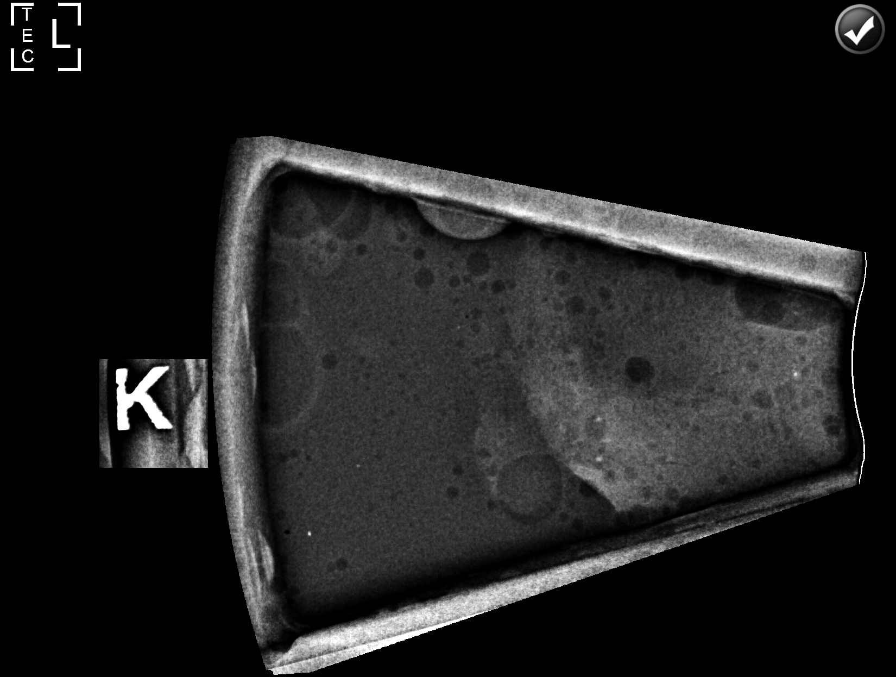

[L (7 of 8)]
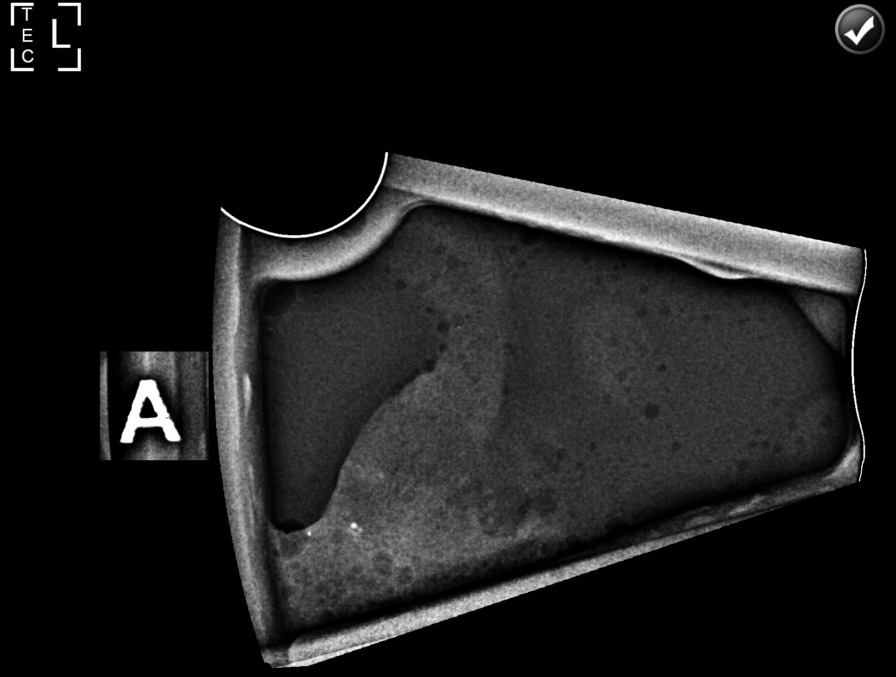

[L (8 of 8)]
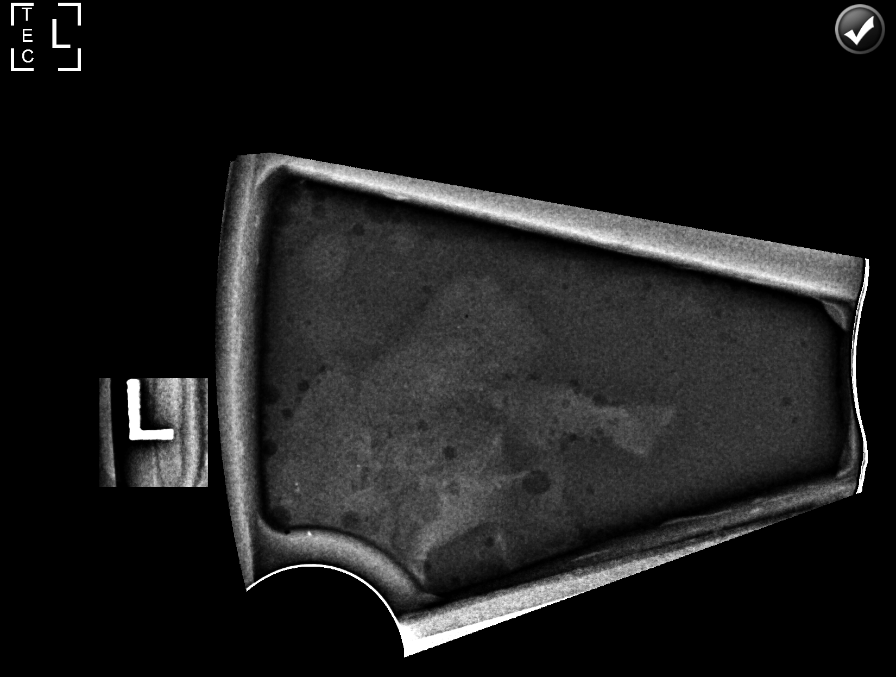

[8 of 40 positions shown; findings below may reference images not displayed]



Using sterile technique and 1% Lidocaine as local anesthetic, under
stereotactic guidance, a 9 gauge vacuum assisted device was used to
perform core needle biopsy of calcifications in the upper inner
quadrant of the left breast using a superior approach. Specimen
radiograph was performed showing at least 6 specimens with
calcifications. Specimens with calcifications are identified for
pathology.

Lesion quadrant: Upper inner quadrant

At the conclusion of the procedure, a coil tissue marker clip was
deployed into the biopsy cavity. Follow-up 2-view mammogram was
performed and dictated separately.
IMPRESSION: Stereotactic-guided biopsy of calcifications in the upper inner
quadrant the left breast. No apparent complications.

ADDENDUM:
Pathology revealed FIBROCYSTIC CHANGE AND SCLEROSING ADENOSIS WITH
CALCIFICATIONS of the LEFT breast, upper inner. This was found to be
concordant by Dr. ROSMA.

Pathology results were discussed with ROSMA by telephone,
per patient request. Ms. ROSMA reported the patient did well after
the biopsy with tenderness at the site. Post biopsy instructions and
care were reviewed and questions were answered. Ms. ROSMA was
encouraged to call The [REDACTED] for any
additional concerns.

The patient was instructed to return for LEFT diagnostic mammography
in 6 months for the remaining groups of calcifications, and LEFT
breast ultrasound in [DATE] to ensure two year stability of
the probably benign left breast mass at 3 o'clock.

Consider breast MRI given personal history of pre menopausal RIGHT
breast cancer and extremely dense breast tissue.

Pathology results reported by ROSMA, RN on [DATE].



Using sterile technique and 1% Lidocaine as local anesthetic, under
stereotactic guidance, a 9 gauge vacuum assisted device was used to
perform core needle biopsy of calcifications in the upper inner
quadrant of the left breast using a superior approach. Specimen
radiograph was performed showing at least 6 specimens with
calcifications. Specimens with calcifications are identified for
pathology.

Lesion quadrant: Upper inner quadrant

At the conclusion of the procedure, a coil tissue marker clip was
deployed into the biopsy cavity. Follow-up 2-view mammogram was
performed and dictated separately.
IMPRESSION: Stereotactic-guided biopsy of calcifications in the upper inner
quadrant the left breast. No apparent complications.

## 2020-04-24 ENCOUNTER — Telehealth: Payer: Self-pay | Admitting: Hematology and Oncology

## 2020-04-24 NOTE — Telephone Encounter (Signed)
Called pt per 6/17 sch message - unable to reach pt - left message for patient to call back   Interpreter ID #:  [phone]

## 2020-05-01 ENCOUNTER — Inpatient Hospital Stay: Payer: BC Managed Care – PPO | Admitting: Hematology and Oncology

## 2020-05-26 NOTE — Progress Notes (Signed)
Patient Care Team: Newton Pigg, MD as PCP - General (Obstetrics and Gynecology) Nicholas Lose, MD as Consulting Physician (Hematology and Oncology) Eppie Gibson, MD as Attending Physician (Radiation Oncology)  DIAGNOSIS:    ICD-10-CM   1. 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, estrogen receptor positive ([place])  09/30/2018 Surgery   Right mastectomy with axillary lymph node dissection in [place]: Grade 2 IDC, 5.3 cm, DCIS, margins negative, lymphovascular invasion present, 0/18 lymph nodes, ER 60%, PR 0%, HER-2 negative, Ki-67 90%, T3N0 stage II B   11/15/2018 Cancer Staging   Staging form: Breast, AJCC 8th Edition - Pathologic: Stage IIB (pT3, pN0, cM0, G2, ER+, PR-, HER2-) - Signed by Nicholas Lose, MD on 11/15/2018   11/29/2018 Oncotype testing   Oncotype DX score 28: 17% risk of distant recurrence at 9 years   12/08/2018 Genetic Testing   Negative genetic testing on the common hereditary cancer panel.  The Hereditary Gene Panel offered by [facility] includes sequencing and/or deletion duplication testing of the following 47 genes: APC, ATM, AXIN2, BARD1, BMPR1A, BRCA1, BRCA2, BRIP1, CDH1, CDK4, CDKN2A (p14ARF), CDKN2A (p16INK4a), CHEK2, CTNNA1, DICER1, EPCAM (Deletion/duplication testing only), GREM1 (promoter region deletion/duplication testing only), KIT, MEN1, MLH1, MSH2, MSH3, MSH6, MUTYH, NBN, NF1, NHTL1, PALB2, PDGFRA, PMS2, POLD1, POLE, PTEN, RAD50, RAD51C, RAD51D, SDHB, SDHC, SDHD, SMAD4, SMARCA4. STK11, TP53, TSC1, TSC2, and VHL.  The following genes were evaluated for sequence changes only: SDHA and HOXB13 c.251G>A variant only. The report date is December 08, 2018.   12/29/2018 -  Chemotherapy   Adjuvant chemotherapy with dose dense Adriamycin and Cytoxan  followed by Taxol.     05/31/2019 - 07/11/2019 Radiation Therapy   The patient initially received a dose of 50 Gy in 25 fractions to the breast  and 45 Gy in 25 fractions to the scv using whole-breast tangent fields. This was delivered using a 3-D conformal technique. The pt received a boost delivering an additional 10 Gy in 5 fractions using a electron boost with 52mV electrons. The total dose was 105 Gy.    07/2019 - 07/2029 Anti-estrogen oral therapy   Tamoxifen 270mdaily     CHIEF COMPLIANT: Follow-up of right breast cancer on tamoxifen  INTERVAL HISTORY: Ariana Luna is a [age].o. with above-mentioned history of right breast cancer [place], radiation, and is currently on anti-estrogen therapy with tamoxifen. Mammogram on 02/26/20 showed scattered calcifications in the upper inner left breast, a stable probably benign mass at the 3 o'clock position in the left breast, and resolution of the previously seen cyst at the 10 o'clock position. Biopsy on 03/17/20 showed fibrocystic changes and sclerosing adenosis with calcifications and no evidence of malignancy. She presents to the clinic today for follow-up.  ALLERGIES:  has No Known Allergies.  MEDICATIONS:  Current Outpatient Medications  Medication Sig Dispense Refill  . tamoxifen (NOLVADEX) 20 MG tablet Take 1 tablet (20 mg total) by mouth daily. 90 tablet 3   No current facility-administered medications for this visit.    PHYSICAL EXAMINATION: ECOG PERFORMANCE STATUS: 1 - Symptomatic but completely ambulatory  There were no vitals filed for this visit. There were no vitals filed for this visit.  BREAST: No palpable masses or nodules in either right or  left breasts. No palpable axillary supraclavicular or infraclavicular adenopathy no breast tenderness or nipple discharge. (exam performed in the presence of a chaperone)  LABORATORY DATA:  I have reviewed  the data as listed CMP Latest Ref Rng & Units 05/10/2019 05/04/2019 04/27/2019  Glucose 70 - 99 mg/dL 103(H) 103(H) 93  BUN 6 - 20 mg/dL _0 Creatinine 0.44 - 1.00 mg/dL 0.64 0.66 0.67  Sodium 135 - 145 mmol/L 139 138 139  Potassium 3.5 - 5.1 mmol/L 3.9 3.7 3.8  Chloride 98 - 111 mmol/L 103 105 105  CO2 22 - 32 mmol/L _1 Calcium 8.9 - 10.3 mg/dL 9.1 9.1 9.2  Total Protein 6.5 - 8.1 g/dL 7.3 7.2 7.7  Total Bilirubin 0.3 - 1.2 mg/dL 0.4 0.3 0.3  Alkaline Phos 38 - 126 U/L 115 125 123  AST 15 - 41 U/L 53(H) 66(H) 68(H)  ALT 0 - 44 U/L 75(H) 94(H) 71(H)    Lab Results  Component Value Date   WBC 3.9 (L) 06/05/2019   HGB 12.9 06/05/2019   HCT 41.8 06/05/2019   MCV 88.6 06/05/2019   PLT 195 06/05/2019   NEUTROABS 2.1 06/05/2019    ASSESSMENT & PLAN:  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 Adjuvant radiation therapy because of the tumor size being large: Started 05/31/2019 completed 07/11/2019 3. Followed by adjuvant antiestrogen therapy with tamoxifen 20 mg daily x10 years started 07/10/2019 discontinued December 2020, started anastrozole half a tablet daily 05/27/2020 ------------------------------------------------------------------------------------------------------------------- Antiestrogen therapy counseling: Patient does not want to take tamoxifen anymore.  She states that she feels better since she has been off of it.  She could not even tolerate half a tablet daily.  Because she had prior nephrectomy we  recommended starting her on anastrozole half a tablet daily.  I stressed the importance of taking antiestrogen therapy because of her type of breast cancer. She is willing to try half a tablet daily.  Severe fatigue: I will obtain CBC CMP and iron studies and call her with results.  Breast cancer surveillance: left breast mammogram 02/26/2020: Scattered grouped calcifications left breast spanning 6 cm indeterminate: Biopsy fibrocystic change and sclerosing adenosis with calcifications  Return to clinic in 3 months for MyChart virtual visit to assess tolerability to anastrozole therapy.  No orders of the defined types were placed in this encounter.  The patient has a good understanding of the overall plan. she agrees with it. she will call with any problems that may develop before the next visit here.  Total time spent: 20 mins including face to face time and time spent for planning, charting and coordination of care  Nicholas Lose, MD 05/27/2020  I, Ariana Luna, am acting as scribe for Dr. Nicholas Lose.  I have reviewed the above documentation for accuracy and completeness, and I agree with the above.

## 2020-05-27 ENCOUNTER — Inpatient Hospital Stay: Payer: BC Managed Care – PPO | Attending: Hematology and Oncology | Admitting: Hematology and Oncology

## 2020-05-27 ENCOUNTER — Other Ambulatory Visit: Payer: Self-pay

## 2020-05-27 ENCOUNTER — Inpatient Hospital Stay: Payer: BC Managed Care – PPO

## 2020-05-27 DIAGNOSIS — R5383 Other fatigue: Secondary | ICD-10-CM | POA: Diagnosis not present

## 2020-05-27 DIAGNOSIS — Z79810 Long term (current) use of selective estrogen receptor modulators (SERMs): Secondary | ICD-10-CM | POA: Insufficient documentation

## 2020-05-27 DIAGNOSIS — Z9221 Personal history of antineoplastic chemotherapy: Secondary | ICD-10-CM | POA: Insufficient documentation

## 2020-05-27 DIAGNOSIS — Z9011 Acquired absence of right breast and nipple: Secondary | ICD-10-CM | POA: Diagnosis not present

## 2020-05-27 DIAGNOSIS — C50011 Malignant neoplasm of nipple and areola, right female breast: Secondary | ICD-10-CM | POA: Diagnosis not present

## 2020-05-27 DIAGNOSIS — Z170 Estrogen receptor positive status [ER+]: Secondary | ICD-10-CM

## 2020-05-27 DIAGNOSIS — Z923 Personal history of irradiation: Secondary | ICD-10-CM | POA: Diagnosis not present

## 2020-05-27 LAB — CMP (CANCER CENTER ONLY)
ALT: 216 U/L — ABNORMAL HIGH (ref 0–44)
AST: 131 U/L — ABNORMAL HIGH (ref 15–41)
Albumin: 4.1 g/dL (ref 3.5–5.0)
Alkaline Phosphatase: 174 U/L — ABNORMAL HIGH (ref 38–126)
Anion gap: 9 (ref 5–15)
BUN: 13 mg/dL (ref 6–20)
CO2: 28 mmol/L (ref 22–32)
Calcium: 9.5 mg/dL (ref 8.9–10.3)
Chloride: 103 mmol/L (ref 98–111)
Creatinine: 1.25 mg/dL — ABNORMAL HIGH (ref 0.44–1.00)
GFR, Est AFR Am: >60 mL/min (ref >60)
GFR, Estimated: 52 mL/min — ABNORMAL LOW (ref >60)
Glucose, Bld: 114 mg/dL — ABNORMAL HIGH (ref 70–99)
Potassium: 3.9 mmol/L (ref 3.5–5.1)
Sodium: 140 mmol/L (ref 135–145)
Total Bilirubin: 0.5 mg/dL (ref 0.3–1.2)
Total Protein: 7.6 g/dL (ref 6.5–8.1)

## 2020-05-27 LAB — CBC WITH DIFFERENTIAL (CANCER CENTER ONLY)
Abs Immature Granulocytes: 0.01 10*3/uL (ref 0.00–0.07)
Basophils Absolute: 0 10*3/uL (ref 0.0–0.1)
Basophils Relative: 1 %
Eosinophils Absolute: 0.1 10*3/uL (ref 0.0–0.5)
Eosinophils Relative: 2 %
HCT: 41.2 % (ref 36.0–46.0)
Hemoglobin: 13.4 g/dL (ref 12.0–15.0)
Immature Granulocytes: 0 %
Lymphocytes Relative: 40 %
Lymphs Abs: 1.7 10*3/uL (ref 0.7–4.0)
MCH: 29 pg (ref 26.0–34.0)
MCHC: 32.5 g/dL (ref 30.0–36.0)
MCV: 89.2 fL (ref 80.0–100.0)
Monocytes Absolute: 0.2 10*3/uL (ref 0.1–1.0)
Monocytes Relative: 6 %
Neutro Abs: 2.2 10*3/uL (ref 1.7–7.7)
Neutrophils Relative %: 51 %
Platelet Count: 149 10*3/uL — ABNORMAL LOW (ref 150–400)
RBC: 4.62 MIL/uL (ref 3.87–5.11)
RDW: 13.2 % (ref 11.5–15.5)
WBC Count: 4.3 10*3/uL (ref 4.0–10.5)
nRBC: 0 % (ref 0.0–0.2)

## 2020-05-27 MED ORDER — ANASTROZOLE 1 MG PO TABS
1.0000 mg | ORAL_TABLET | Freq: Every day | ORAL | 3 refills | Status: DC
Start: 2020-05-27 — End: 2021-08-27

## 2020-05-27 NOTE — Assessment & Plan Note (Signed)
09/30/2018:Right mastectomy with axillary lymph node dissection in [place]: Grade 2 IDC, 5.3 cm, DCIS, margins negative, lymphovascular invasion present, 0/18 lymph nodes, ER 60%, PR 0%, Ariana Luna-2 negative, Ki-67 90%, T3N0 stage II B Oncotype DX: 28: High risk  Treatment plan: 1.Adjuvant chemotherapy withdose dense Adriamycin andCytoxan x4 followed by Taxol weekly x12completed 05/10/2019 2. Adjuvant radiation therapy because of the tumor size being large: Started 05/31/2019 completed 07/11/2019 3. Followed by adjuvant antiestrogen therapy with tamoxifen 20 mg daily x10 years started 07/10/2019 ------------------------------------------------------------------------------------------------------------------- Tamoxifen toxicities: 1.  Muscle aches and pains especially in the wrists and hands: I instructed Ariana Luna to decrease the dosage of tamoxifen to 10 mg daily. 2.  Hot flashes  Breast cancer surveillance: 1.  Breast exam 05/27/2020: Benign 2. left breast mammogram 02/26/2020: Scattered grouped calcifications left breast spanning 6 cm indeterminate: Biopsy fibrocystic change and sclerosing adenosis with calcifications  Return to clinic in 1 year for follow-up Return to clinic in 1 year for follow-up

## 2020-05-28 LAB — IRON AND TIBC
Iron: 139 ug/dL (ref 41–142)
Saturation Ratios: 45 % (ref 21–57)
TIBC: 306 ug/dL (ref 236–444)
UIBC: 167 ug/dL (ref 120–384)

## 2020-05-28 LAB — FERRITIN: Ferritin: 275 ng/mL (ref 11–307)

## 2020-08-27 NOTE — Progress Notes (Signed)
HEMATOLOGY-ONCOLOGY TELEPHONE VISIT PROGRESS NOTE  I connected with Ariana Luna on 08/28/2020 at  3:30 PM EDT by telephone and verified that I am speaking with the correct person using two identifiers.  I discussed the limitations, risks, security and privacy concerns of performing an evaluation and management service by telephone and the availability of in person appointments.  I also discussed with the patient that there may be a patient responsible charge related to this service. The patient expressed understanding and agreed to proceed.   History of Present Illness: Ariana Luna is a 44 y.o. female with above-mentioned history of right breast cancer treated with right mastectomy in [place] and whocompletedadjuvant chemotherapy,radiation, and is currently on anti-estrogen therapy with anastrozole. She presents over the phone todayfor follow-up.  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, estrogen receptor positive ([place])  09/30/2018 Surgery   Right mastectomy with axillary lymph node dissection in [place]: Grade 2 IDC, 5.3 cm, DCIS, margins negative, lymphovascular invasion present, 0/18 lymph nodes, ER 60%, PR 0%, HER-2 negative, Ki-67 90%, T3N0 stage II B   11/15/2018 Cancer Staging   Staging form: Breast, AJCC 8th Edition - Pathologic: Stage IIB (pT3, pN0, cM0, G2, ER+, PR-, HER2-) - Signed by Nicholas Lose, MD on 11/15/2018   11/29/2018 Oncotype testing   Oncotype DX score 28: 17% risk of distant recurrence at 9 years   12/08/2018 Genetic Testing   Negative genetic testing on the common hereditary cancer panel.  The Hereditary Gene Panel offered by [facility] includes sequencing and/or deletion duplication testing of the following 47 genes: APC, ATM, AXIN2, BARD1, BMPR1A, BRCA1, BRCA2, BRIP1, CDH1, CDK4, CDKN2A (p14ARF), CDKN2A (p16INK4a), CHEK2, CTNNA1, DICER1, EPCAM (Deletion/duplication testing only), GREM1 (promoter  region deletion/duplication testing only), KIT, MEN1, MLH1, MSH2, MSH3, MSH6, MUTYH, NBN, NF1, NHTL1, PALB2, PDGFRA, PMS2, POLD1, POLE, PTEN, RAD50, RAD51C, RAD51D, SDHB, SDHC, SDHD, SMAD4, SMARCA4. STK11, TP53, TSC1, TSC2, and VHL.  The following genes were evaluated for sequence changes only: SDHA and HOXB13 c.251G>A variant only. The report date is December 08, 2018.   12/29/2018 -  Chemotherapy   Adjuvant chemotherapy with dose dense Adriamycin and Cytoxan followed by Taxol.     05/31/2019 - 07/11/2019 Radiation Therapy   The patient initially received a dose of 50 Gy in 25 fractions to the breast  and 45 Gy in 25 fractions to the scv using whole-breast tangent fields. This was delivered using a 3-D conformal technique. The pt received a boost delivering an additional 10 Gy in 5 fractions using a electron boost with 44mV electrons. The total dose was 105 Gy.    07/2019 - 07/2029 Anti-estrogen oral therapy   Tamoxifen 210mdaily x10 years started 07/10/2019 discontinued December 2020, started anastrozole half a tablet daily 05/27/2020     Observations/Objective:     Assessment Plan:  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, estrogen [place] positive [place] 5.[place] cm, DCIS, margins negative, lymphovascular invasion present, 0/18 lymph nodes, ER 60%, PR 0%, HER-2 negative, Ki-67 90%, T3N0 stage II B Oncotype DX: 28: High risk  Treatment plan: 1.Adjuvant chemotherapy withdose dense Adriamycin andCytoxan x4 followed by Taxol weekly x12completed 05/10/2019 2. Adjuvant radiation therapy because of the tumor size being large: Started 05/31/2019 completed 07/11/2019 3. Followed by adjuvant antiestrogen therapy with tamoxifen 20 mg daily x10 yearsstarted 07/10/2019 discontinued December 2020, started anastrozole half a tablet daily  05/27/2020 ------------------------------------------------------------------------------------------------------------------- Current treatment: Could not tolerate tamoxifen even half a tablet.  Anastrozole 1 mg daily Anastrozole toxicities:tolerating it well except for fatigue  Breast cancer surveillance: left breast mammogram 02/26/2020: Scattered grouped calcifications left breast spanning 6 cm indeterminate: Biopsy fibrocystic change and sclerosing adenosis with calcifications   Breast MRI scheduled for Nov 2021  Return to clinic in 1 year for follow-up   I discussed the assessment and treatment plan with the patient. The patient was provided an opportunity to ask questions and all were answered. The patient agreed with the plan and demonstrated an understanding of the instructions. The patient was advised to call back or seek an in-person evaluation if the symptoms worsen or if the condition fails to improve as anticipated.   I provided 12 minutes of non-face-to-face time during this encounter.   Rulon Eisenmenger, MD 08/28/2020    I, Molly Dorshimer, am acting as scribe for Nicholas Lose, MD.  I have reviewed the above documentation for accuracy and completeness, and I agree with the above.

## 2020-08-28 ENCOUNTER — Inpatient Hospital Stay: Payer: BC Managed Care – PPO | Attending: Hematology and Oncology | Admitting: Hematology and Oncology

## 2020-08-28 DIAGNOSIS — C50011 Malignant neoplasm of nipple and areola, right female breast: Secondary | ICD-10-CM

## 2020-08-28 DIAGNOSIS — Z170 Estrogen receptor positive status [ER+]: Secondary | ICD-10-CM | POA: Diagnosis not present

## 2020-08-28 NOTE — Assessment & Plan Note (Signed)
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 Adjuvant radiation therapy because of the tumor size being large: Started 05/31/2019 completed 07/11/2019 3. Followed by adjuvant antiestrogen therapy with tamoxifen 20 mg daily x10 yearsstarted 07/10/2019 discontinued December 2020, started anastrozole half a tablet daily 05/27/2020 ------------------------------------------------------------------------------------------------------------------- Current treatment: Could not tolerate tamoxifen even half a tablet.  Anastrozole 0.5 mg daily Anastrozole toxicities:  Breast cancer surveillance: left breast mammogram 02/26/2020: Scattered grouped calcifications left breast spanning 6 cm indeterminate: Biopsy fibrocystic change and sclerosing adenosis with calcifications  Return to clinic in 1 year for follow-up

## 2020-09-01 ENCOUNTER — Telehealth: Payer: Self-pay | Admitting: Hematology and Oncology

## 2020-09-01 NOTE — Telephone Encounter (Signed)
Scheduled pe 10/21 los.Called pt unable to leave a msg. Mailing appt letter and calendar

## 2020-09-08 ENCOUNTER — Ambulatory Visit
Admission: RE | Admit: 2020-09-08 | Discharge: 2020-09-08 | Disposition: A | Discharge status: Home / Self Care | Payer: BC Managed Care – PPO | Source: Ambulatory Visit | Attending: Adult Health | Admitting: Adult Health

## 2020-09-08 ENCOUNTER — Other Ambulatory Visit: Payer: Self-pay

## 2020-09-08 DIAGNOSIS — C50011 Malignant neoplasm of nipple and areola, right female breast: Secondary | ICD-10-CM

## 2020-09-08 DIAGNOSIS — Z170 Estrogen receptor positive status [ER+]: Secondary | ICD-10-CM

## 2020-09-08 IMAGING — MR MR BREAST BILAT WO/W CM
8 of 12 series · 33 of 48 positions shown · IV contrast (gadavist)
Comparison: None.

CLINICAL DATA: 44-year-old female with history of right breast
cancer status post mastectomy in [UN]. No current problems. Benign
biopsy in the left breast in [DATE]

LABS:  None.
EXAM:
BILATERAL BREAST MRI WITH AND WITHOUT CONTRAST
TECHNIQUE: Multiplanar, multisequence MR images of both breasts were obtained
prior to and following the intravenous administration of 5 ml of
Gadavist

[Series 2: t2_tirm_tra ipat (a-p) · axial · 3.0mm · 0.70mm/px · 1 of 55 slices shown]
[im 1/55]
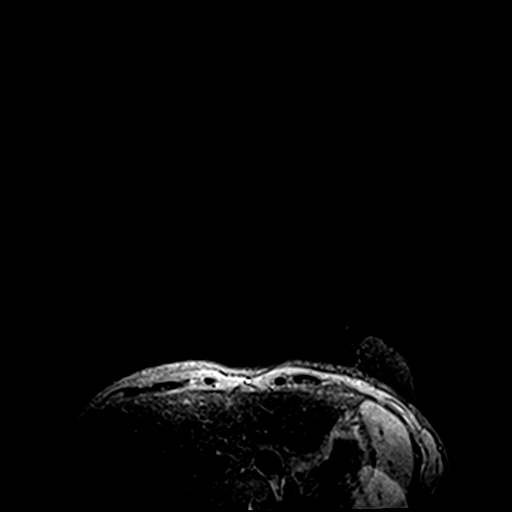

[Series 3: fl3d pre-cm no · axial · non-contrast · 1.2mm · 0.94mm/px · z∈[-72,+99]mm · 5 of 144 slices shown]
[im 1/144]
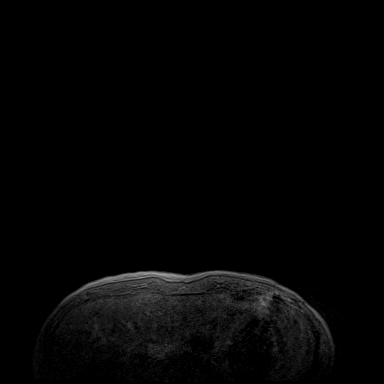
[im 36/144]
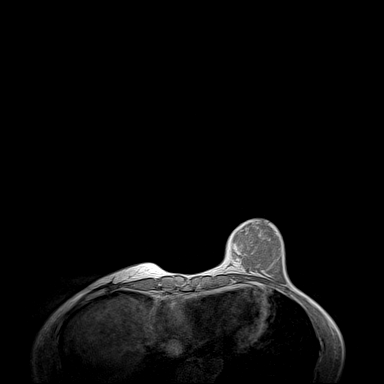
[im 72/144]
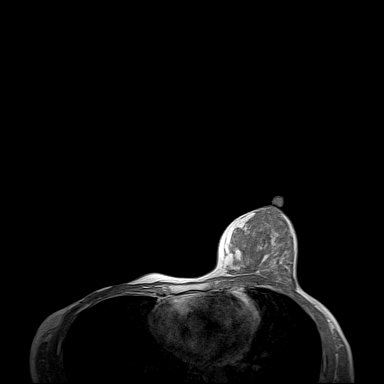
[im 108/144]
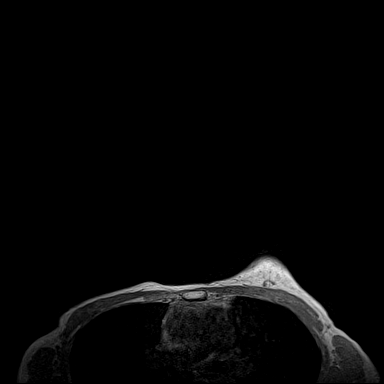
[im 144/144]
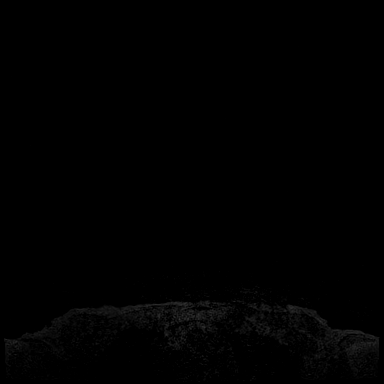

[Series 4: fl3d pre-cm · axial · non-contrast · 1.2mm · 0.94mm/px · z∈[-72,+99]mm · 5 of 144 slices shown]
[im 1/144]
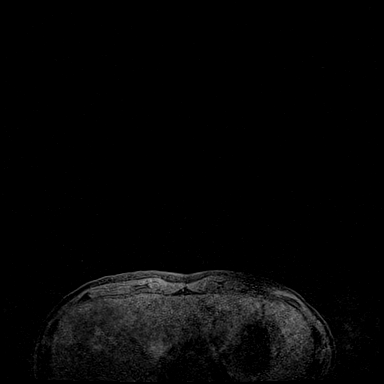
[im 36/144]
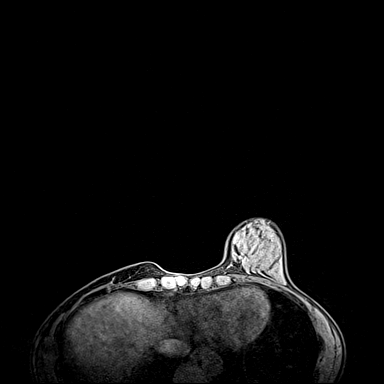
[im 72/144]
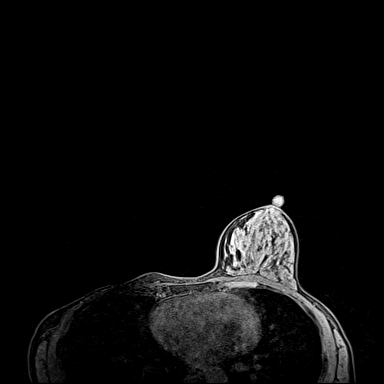
[im 108/144]
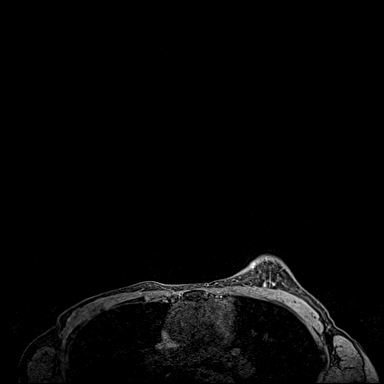
[im 144/144]
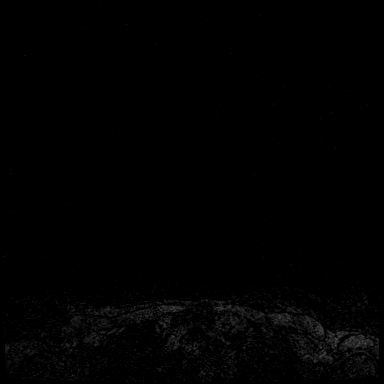

[Series 5: fl3d post immediate · axial · 1.2mm · 0.94mm/px · z∈[-72,+99]mm · 5 of 144 slices shown (1 of 3)]
[im 1/144]
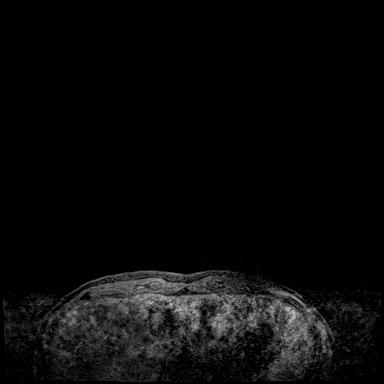
[im 36/144]
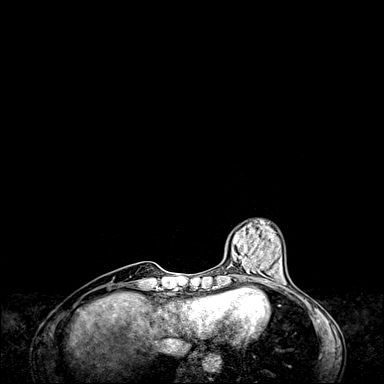
[im 72/144]
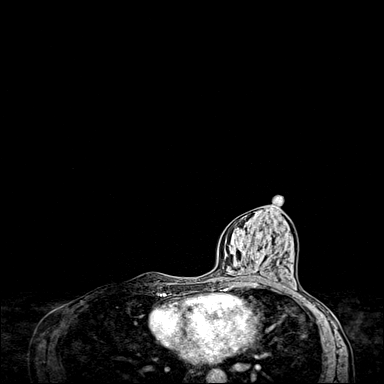
[im 108/144]
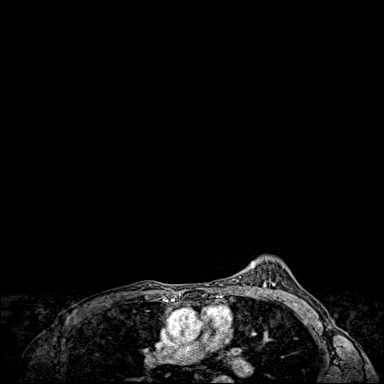
[im 144/144]
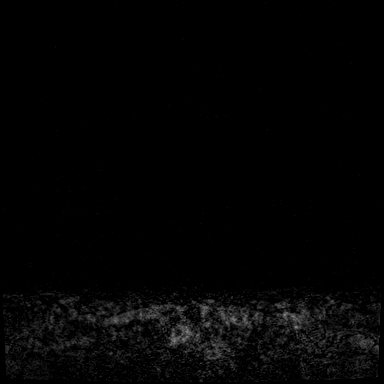

[Series 6: fl3d post immediate · axial · 1.2mm · 0.94mm/px · z∈[-72,+99]mm · 5 of 144 slices shown (2 of 3)]
[im 1/144]
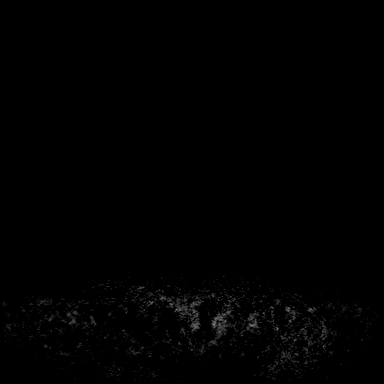
[im 36/144]
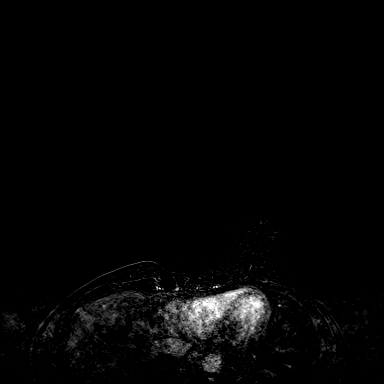
[im 72/144]
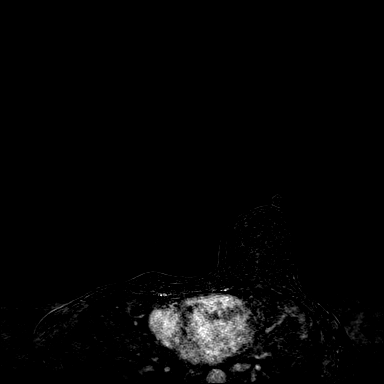
[im 108/144]
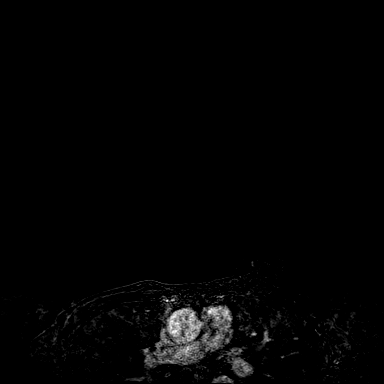
[im 144/144]
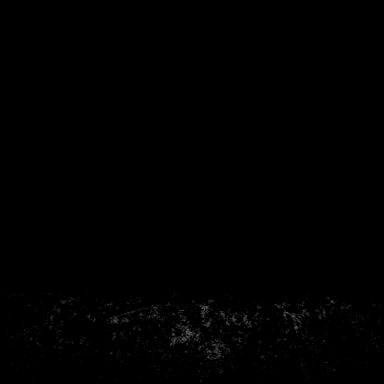

[Series 7: fl3d post immediate · axial · 172.8mm · 0.94mm/px · 1 of 1 slices shown (3 of 3)]
[im 1/1]
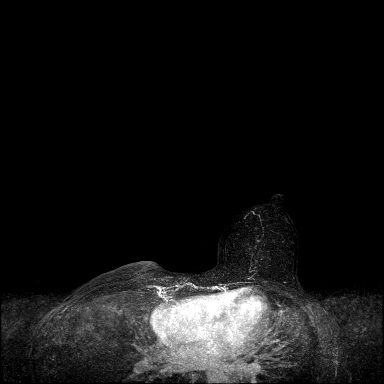

[Series 8: fl3d post 3min · axial · 1.2mm · 0.94mm/px · z∈[-72,+99]mm · 6 of 144 slices shown]
[im 1/144]
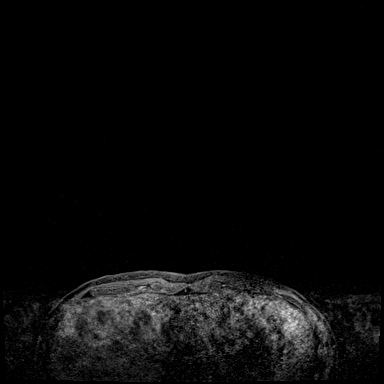
[im 29/144]
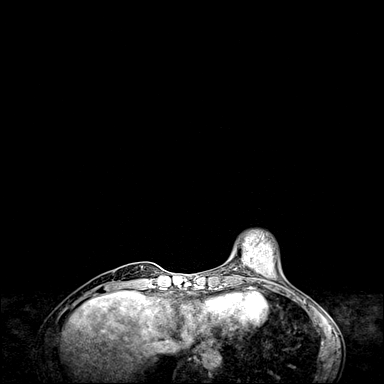
[im 58/144]
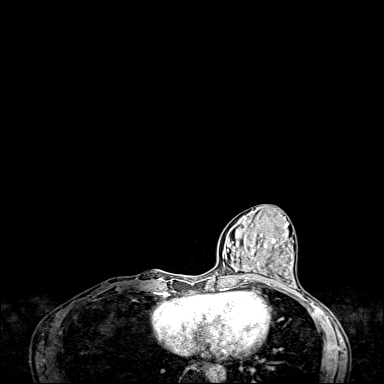
[im 86/144]
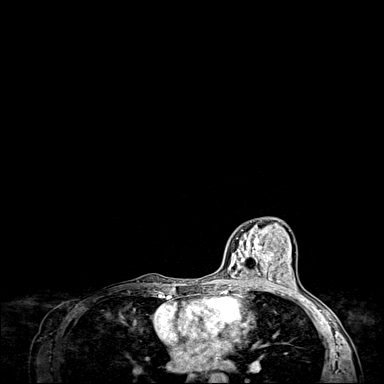
[im 115/144]
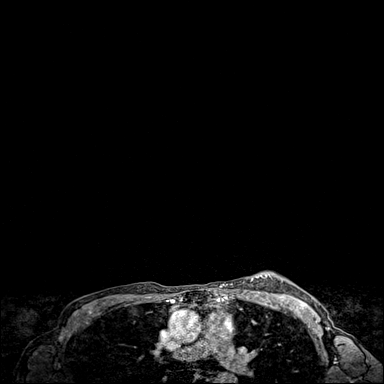
[im 144/144]
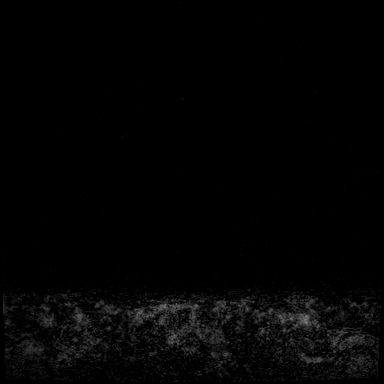

[Series 9: fl3d post 3min_sub · axial · 1.2mm · 0.94mm/px · z∈[-72,+65]mm · 5 of 144 slices shown]
[im 1/144]
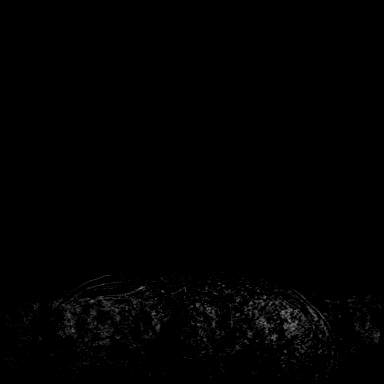
[im 29/144]
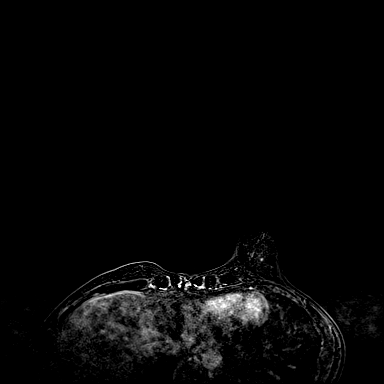
[im 58/144]
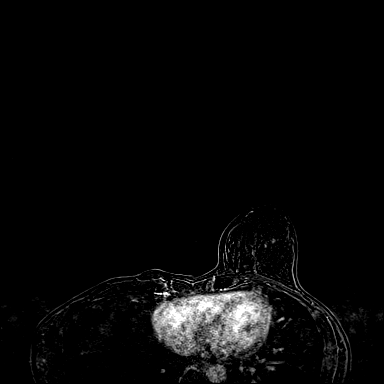
[im 86/144]
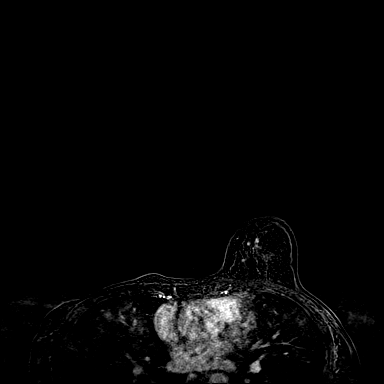
[im 115/144]
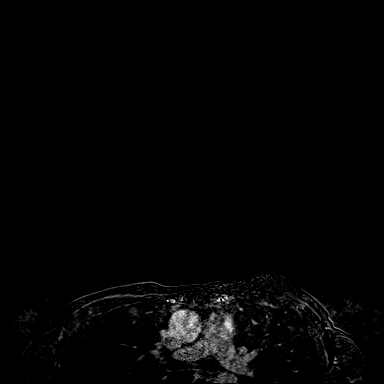

[33 of 48 positions shown; findings below may reference images not displayed]

Three-dimensional MR images were rendered by post-processing of the
original MR data on an independent workstation. The
three-dimensional MR images were interpreted, and findings are
reported in the following complete MRI report for this study. Three
dimensional images were evaluated at the independent interpreting
workstation using the DynaCAD thin client.
FINDINGS: Breast composition: d. Extreme fibroglandular tissue.

Background parenchymal enhancement: Mild

Right breast: Status post right mastectomy.

Left breast: No mass or abnormal enhancement. There is
susceptibility artifact in the upper inner left breast at the site
of prior benign biopsies.

Lymph nodes: No abnormal appearing lymph nodes.

Ancillary findings:  None.
IMPRESSION: No MRI evidence of malignancy in the left breast. Status post right
mastectomy.

RECOMMENDATION:
Continue routine annual screening mammography.

The American Cancer Society recommends annual MRI in patients with
an estimated lifetime risk of developing breast cancer greater than
20 - 25%, or who are known or suspected to be positive for the
breast cancer gene.

BI-RADS CATEGORY  BI-RADS CATEGORY: 1: Negative.

## 2020-09-08 MED ORDER — GADOBUTROL 1 MMOL/ML IV SOLN
5.0000 mL | Freq: Once | INTRAVENOUS | Status: AC | PRN
  Administered 2020-09-08: 5 mL via INTRAVENOUS

## 2020-09-10 ENCOUNTER — Telehealth: Payer: Self-pay

## 2020-09-10 NOTE — Telephone Encounter (Signed)
-----   Message from Gardenia Phlegm, NP sent at 09/10/2020  8:05 AM EDT ----- MRI normal.  Please notify patient.   ----- Message ----- From: Interface, Rad Results In Sent: 09/09/2020  12:22 PM EDT To: Gardenia Phlegm, NP

## 2020-09-10 NOTE — Telephone Encounter (Signed)
This LPN spoke with pt's contact, Casie to make aware of MRI results. Verbalized understanding and translated to pt.

## 2021-03-23 ENCOUNTER — Other Ambulatory Visit: Payer: Self-pay | Admitting: Obstetrics and Gynecology

## 2021-03-23 DIAGNOSIS — N6320 Unspecified lump in the left breast, unspecified quadrant: Secondary | ICD-10-CM

## 2021-05-12 ENCOUNTER — Ambulatory Visit
Admission: RE | Admit: 2021-05-12 | Discharge: 2021-05-12 | Disposition: A | Discharge status: Home / Self Care | Payer: BC Managed Care – PPO | Source: Ambulatory Visit | Attending: Obstetrics and Gynecology | Admitting: Obstetrics and Gynecology

## 2021-05-12 ENCOUNTER — Other Ambulatory Visit: Payer: Self-pay | Admitting: Obstetrics and Gynecology

## 2021-05-12 ENCOUNTER — Other Ambulatory Visit: Payer: Self-pay

## 2021-05-12 DIAGNOSIS — N6320 Unspecified lump in the left breast, unspecified quadrant: Secondary | ICD-10-CM

## 2021-05-12 IMAGING — US US BREAST*L* LIMITED INC AXILLA
1 series · 14 of 14 positions shown · non-contrast
Comparison: Previous exam(s).
COMPARISON: Previous exam(s).

Addendum:
CLINICAL DATA: 45-year-old female status post right mastectomy
presents for first six-month follow-up of probably benign
calcifications left breast and final follow-up of a probably benign
left breast mass. History of benign biopsy of 1 group of
calcifications within a more diffuse span.

EXAM:
DIGITAL DIAGNOSTIC UNILATERAL LEFT MAMMOGRAM WITH TOMOSYNTHESIS AND
CAD; ULTRASOUND LEFT BREAST LIMITED
TECHNIQUE: Left digital diagnostic mammography and breast tomosynthesis was
performed. The images were evaluated with computer-aided detection.;
Targeted ultrasound examination of the left breast was performed

[Series 1: us breast*left* limited inc axilla · 0.04mm/px · 14 of 14 slices shown]
[im 1/14]
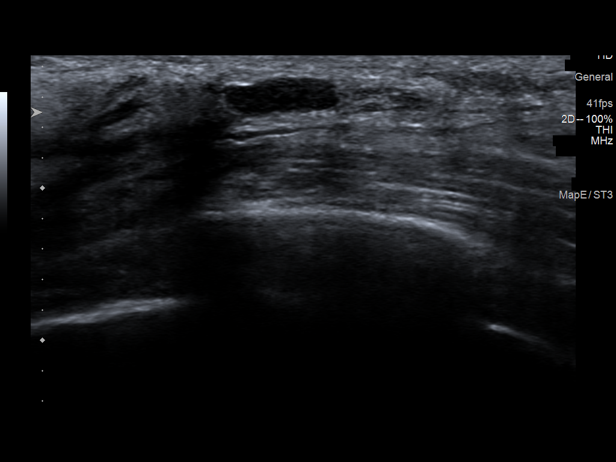
[im 2/14]
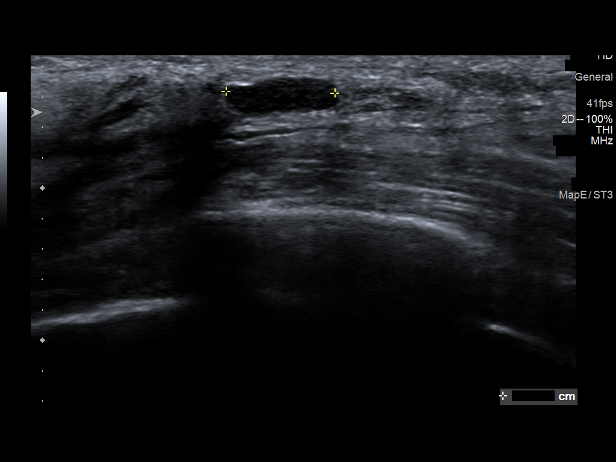
[im 3/14]
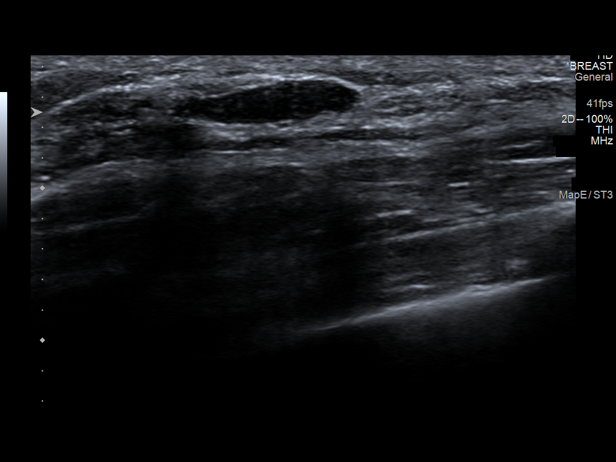
[im 4/14]
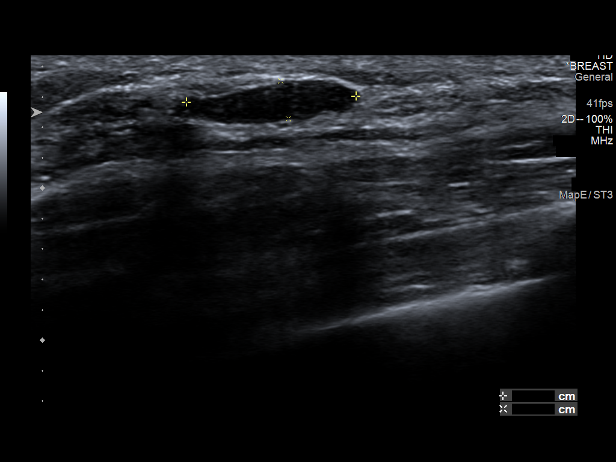
[im 5/14]
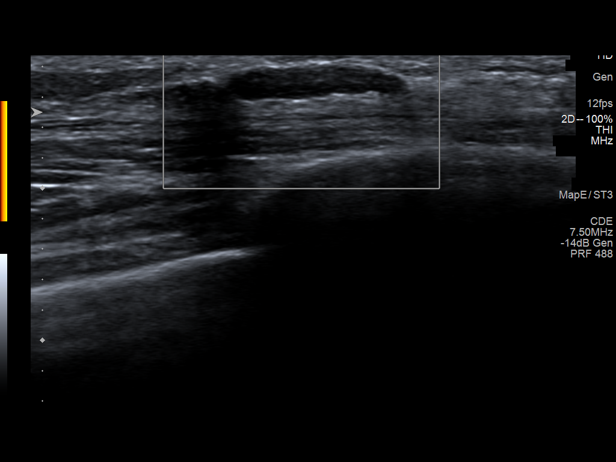
[im 6/14]
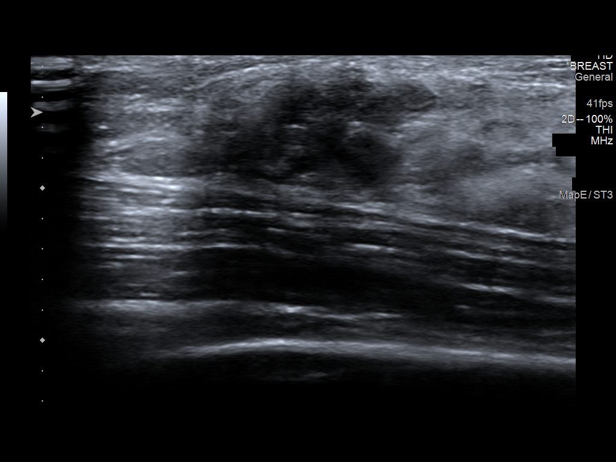
[im 7/14]
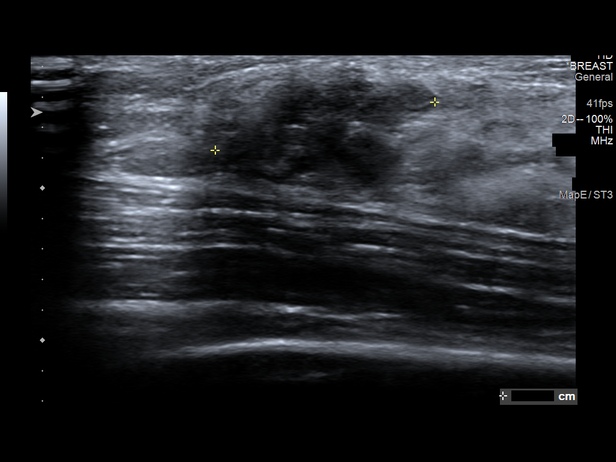
[im 8/14]
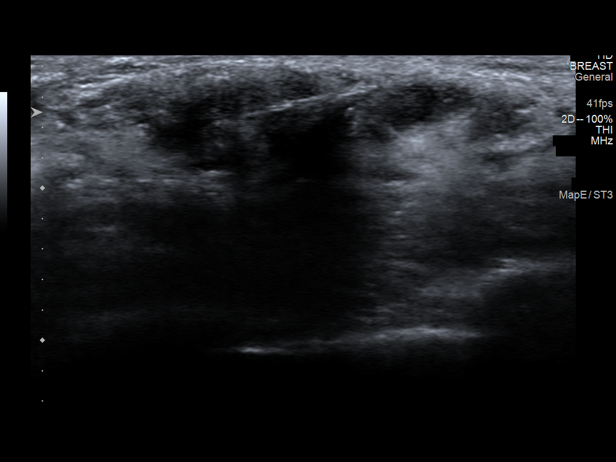
[im 9/14]
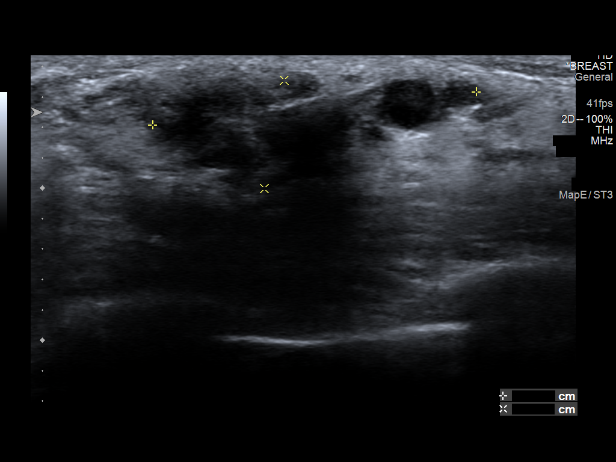
[im 10/14]
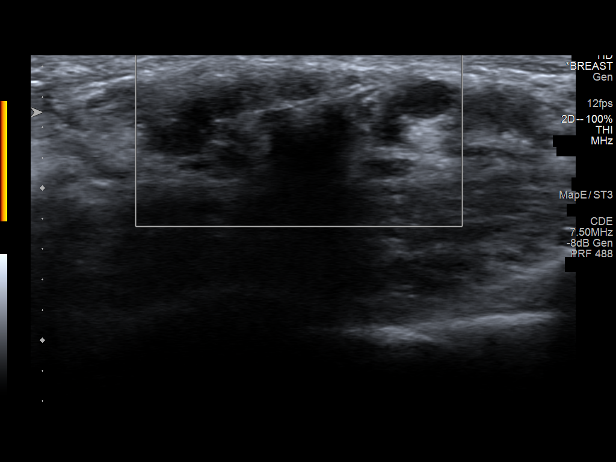
[im 11/14]
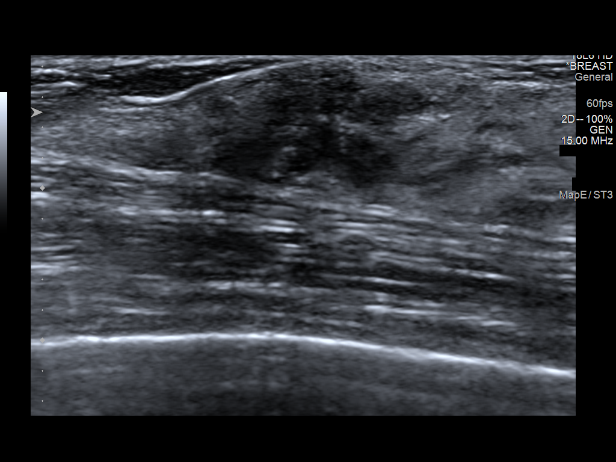
[im 12/14]
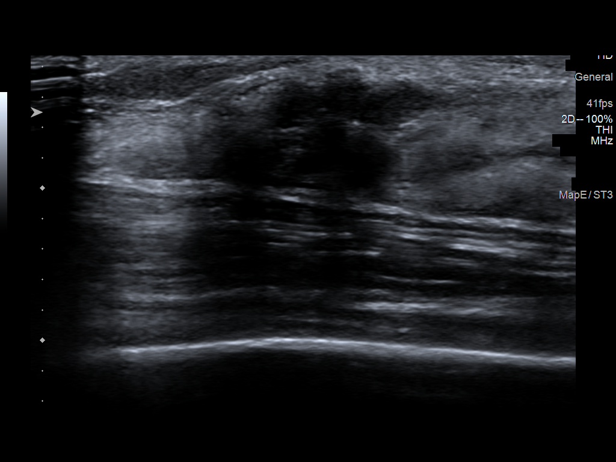
[im 13/14]
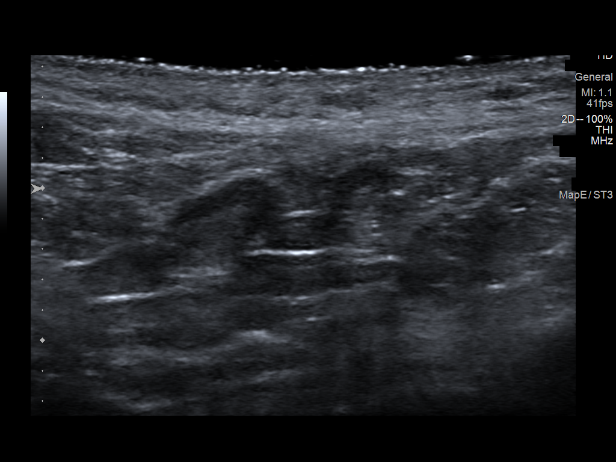
[im 14/14]
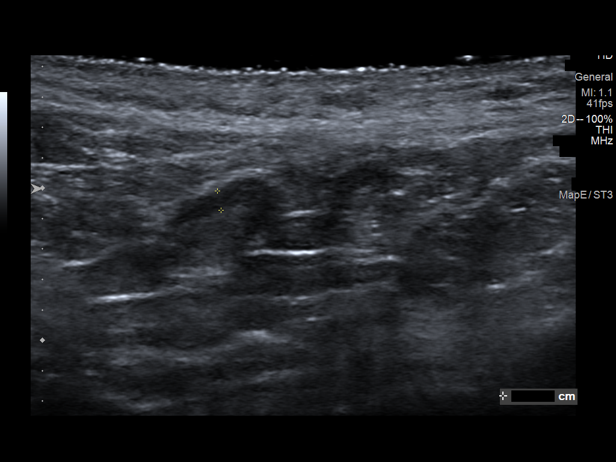

[14 of 14 positions shown; findings below may reference images not displayed]

ACR Breast Density Category d: The breast tissue is extremely dense,
which lowers the sensitivity of mammography.
FINDINGS: Loosely grouped and scattered punctate calcifications throughout the
upper inner quadrant of the left breast are mammographically stable.
Note is made of associated post biopsy changes. Otherwise no
suspicious findings in the remainder of the left breast.

Targeted ultrasound is performed, showing stable appearance of an
oval, circumscribed hypoechoic mass at the 3 o'clock position 6 cm
from the nipple. It measures 1.1 x 0.7 x 0.3 cm (previously 1.1 x
0.8 x 0.3 cm) skin.

A during real-time scanning, an additional irregular hypoechoic mass
was identified at the 2 o'clock position 7 cm from the nipple. This
corresponds with the palpable lump in this region. It measures 2.1 x
1.5 x 0.7 cm. There is no definite associated vascularity.
Evaluation of the left axilla demonstrates no suspicious
lymphadenopathy.
IMPRESSION: 1. Indeterminate, incidentally found left breast mass at the 2
o'clock position 7 cm from the nipple. Recommendation is for
ultrasound-guided biopsy.
2. No suspicious left axillary lymphadenopathy.
3. Stable, probably benign left breast calcifications.
4. Benign left breast mass at the 3 o'clock position demonstrating 2
year stability. No further imaging follow-up required.

RECOMMENDATION:
1. Ultrasound-guided biopsy of the left breast mass along the 2
o'clock axis.
2. Pending biopsy results, if this demonstrates a surgical lesion,
stereotactic biopsy of additional calcification is recommended.

I have discussed the findings and recommendations with the patient.
If applicable, a reminder letter will be sent to the patient
regarding the next appointment.

BI-RADS CATEGORY  4: Suspicious.

ADDENDUM:
This is an addendum to the report originally dictated on [DATE].
The patient presented for a palpable lump along the 2 o'clock axis
of the left breast felt by her referring provider. This lump was
evaluated and recommended for biopsy. The impression section of the
report should read as follows.
IMPRESSION: 1. Indeterminate left breast mass along the 2 o'clock position 7 cm
from the nipple, corresponding with the physician palpated lump.
Recommendation is for ultrasound-guided biopsy. This was performed
to follow. Please see additional reports.
2. No suspicious left axillary lymphadenopathy.
3. Stable, probably benign left breast calcifications.
4. Benign left breast mass at the 3 o'clock position demonstrating 2
year stability. No further imaging follow-up required.

The remainder of the report including the recommendation section is
unchanged.

*** End of Addendum ***
ACR Breast Density Category d: The breast tissue is extremely dense,
which lowers the sensitivity of mammography.
FINDINGS: Loosely grouped and scattered punctate calcifications throughout the
upper inner quadrant of the left breast are mammographically stable.
Note is made of associated post biopsy changes. Otherwise no
suspicious findings in the remainder of the left breast.

Targeted ultrasound is performed, showing stable appearance of an
oval, circumscribed hypoechoic mass at the 3 o'clock position 6 cm
from the nipple. It measures 1.1 x 0.7 x 0.3 cm (previously 1.1 x
0.8 x 0.3 cm) skin.

A during real-time scanning, an additional irregular hypoechoic mass
was identified at the 2 o'clock position 7 cm from the nipple. This
corresponds with the palpable lump in this region. It measures 2.1 x
1.5 x 0.7 cm. There is no definite associated vascularity.
Evaluation of the left axilla demonstrates no suspicious
lymphadenopathy.
IMPRESSION: 1. Indeterminate, incidentally found left breast mass at the 2
o'clock position 7 cm from the nipple. Recommendation is for
ultrasound-guided biopsy.
2. No suspicious left axillary lymphadenopathy.
3. Stable, probably benign left breast calcifications.
4. Benign left breast mass at the 3 o'clock position demonstrating 2
year stability. No further imaging follow-up required.

RECOMMENDATION:
1. Ultrasound-guided biopsy of the left breast mass along the 2
o'clock axis.
2. Pending biopsy results, if this demonstrates a surgical lesion,
stereotactic biopsy of additional calcification is recommended.

I have discussed the findings and recommendations with the patient.
If applicable, a reminder letter will be sent to the patient
regarding the next appointment.

BI-RADS CATEGORY  4: Suspicious.

## 2021-05-12 IMAGING — MG MM DIGITAL DIAGNOSTIC UNILAT*L* W/ TOMO W/ CAD
6 series · 7 of 14 positions shown · non-contrast
Comparison: Previous exam(s).
COMPARISON: Previous exam(s).

Addendum:
CLINICAL DATA: 45-year-old female status post right mastectomy
presents for first six-month follow-up of probably benign
calcifications left breast and final follow-up of a probably benign
left breast mass. History of benign biopsy of 1 group of
calcifications within a more diffuse span.

EXAM:
DIGITAL DIAGNOSTIC UNILATERAL LEFT MAMMOGRAM WITH TOMOSYNTHESIS AND
CAD; ULTRASOUND LEFT BREAST LIMITED
TECHNIQUE: Left digital diagnostic mammography and breast tomosynthesis was
performed. The images were evaluated with computer-aided detection.;
Targeted ultrasound examination of the left breast was performed

[L CC]
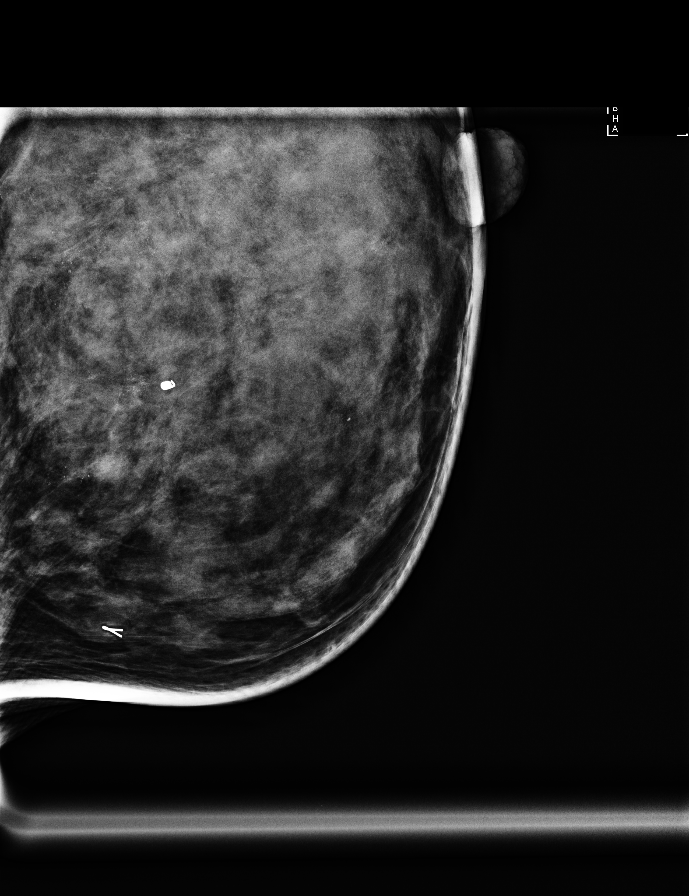

[L ML]
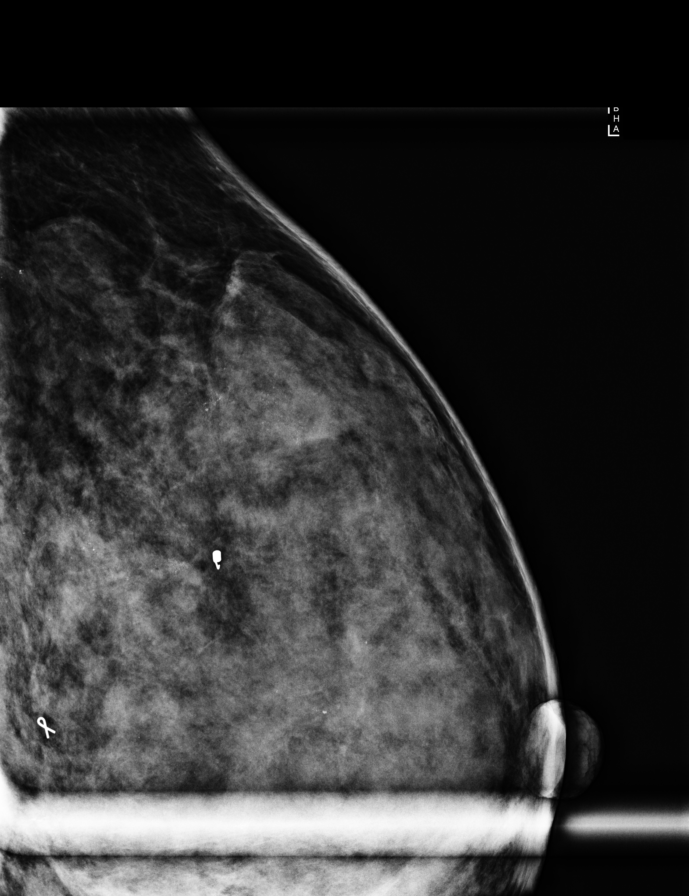

[L MLO synth-2D]
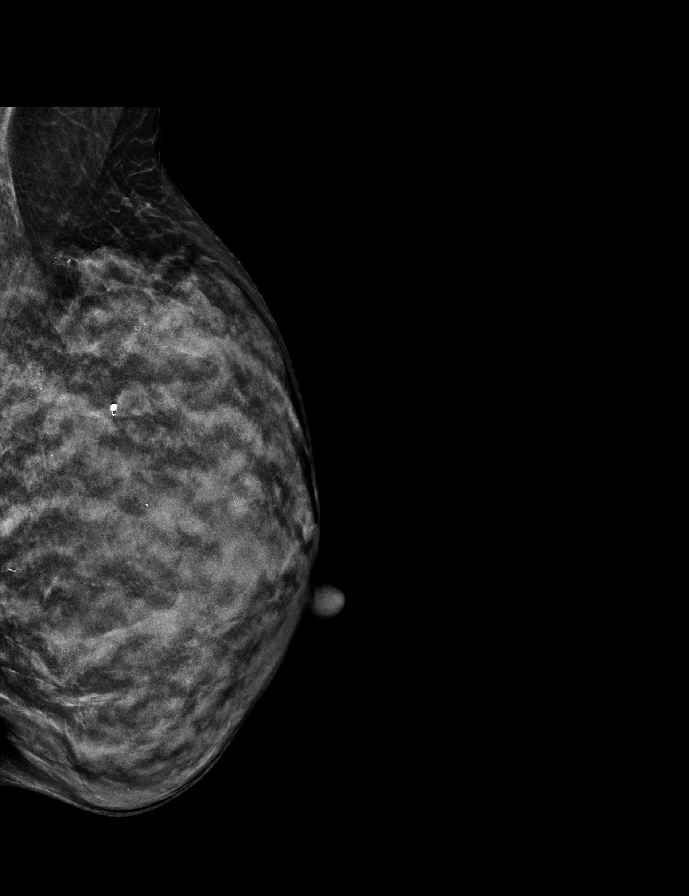

[L CC synth-2D]
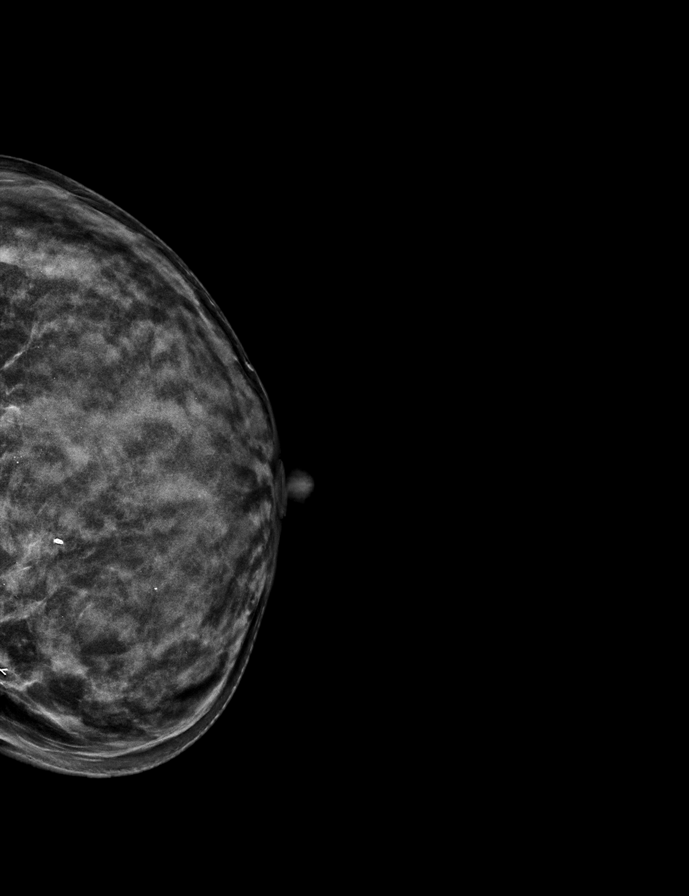

[L MLO tomo · 2 of 48 frames shown]
[frame 16/48]
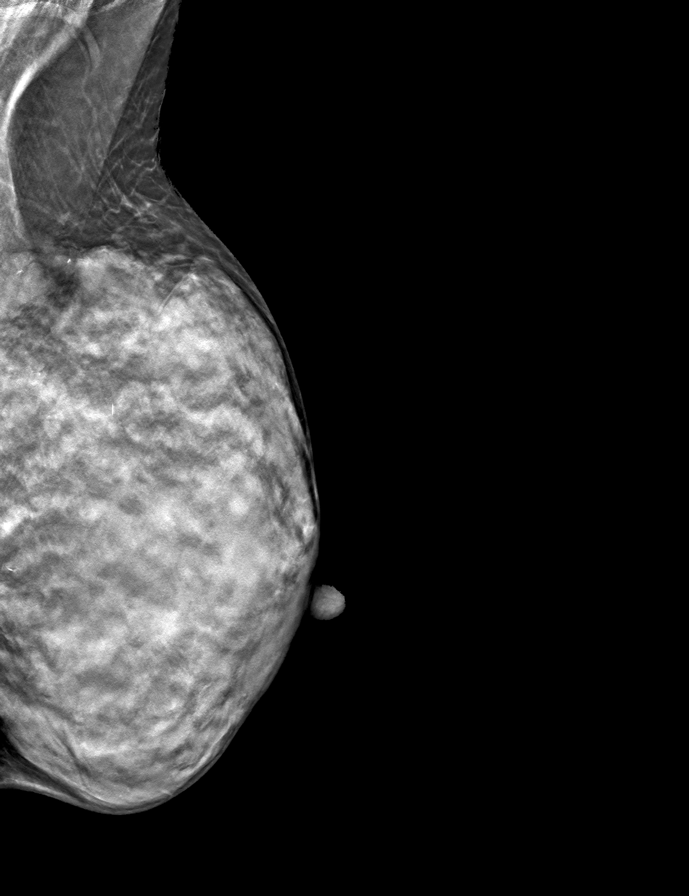
[frame 25/48]
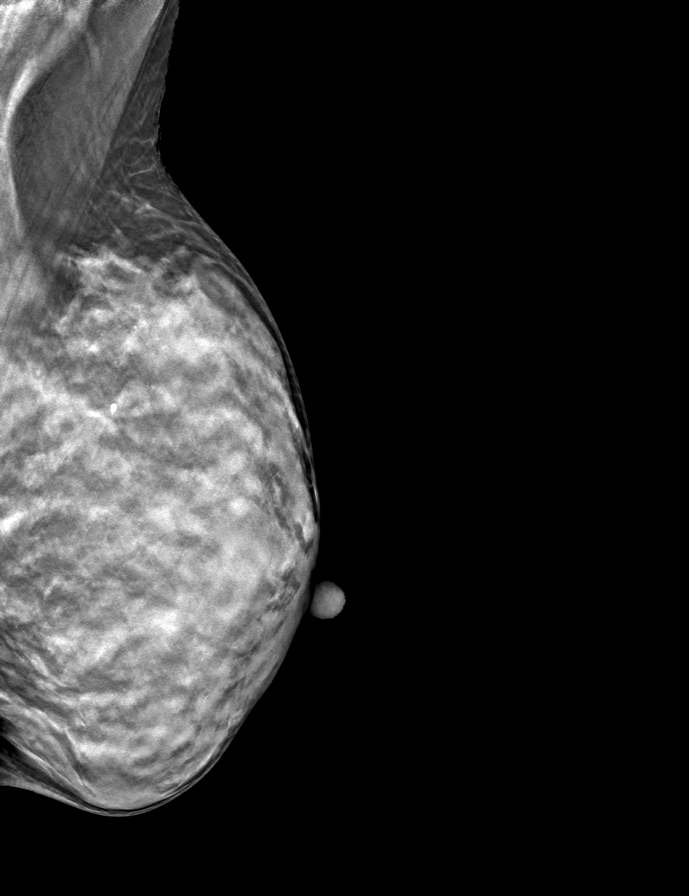

[L CC tomo · tomo slice 25/49.0]
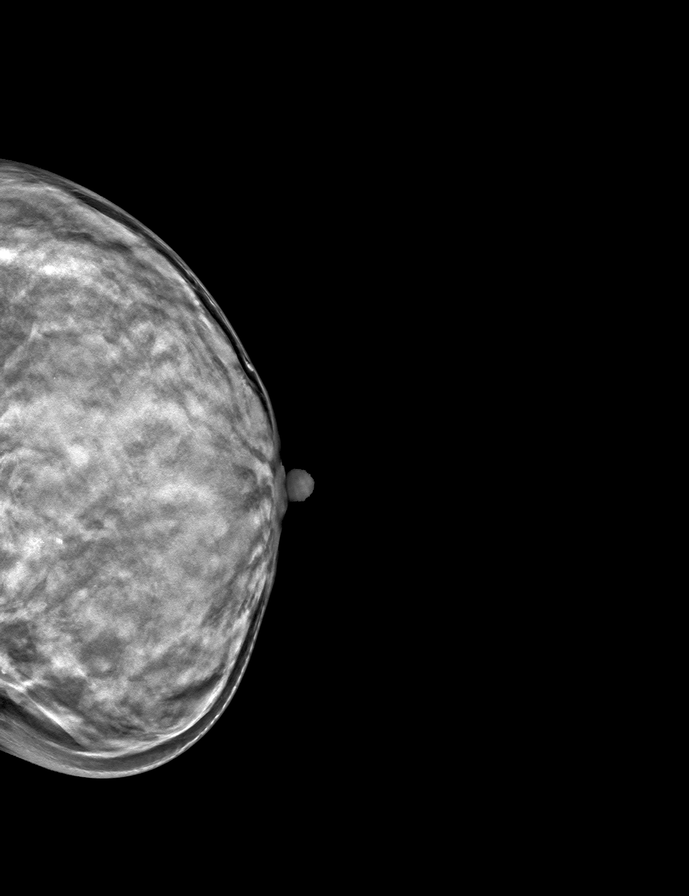

[7 of 14 positions shown; findings below may reference images not displayed]

ACR Breast Density Category d: The breast tissue is extremely dense,
which lowers the sensitivity of mammography.
FINDINGS: Loosely grouped and scattered punctate calcifications throughout the
upper inner quadrant of the left breast are mammographically stable.
Note is made of associated post biopsy changes. Otherwise no
suspicious findings in the remainder of the left breast.

Targeted ultrasound is performed, showing stable appearance of an
oval, circumscribed hypoechoic mass at the 3 o'clock position 6 cm
from the nipple. It measures 1.1 x 0.7 x 0.3 cm (previously 1.1 x
0.8 x 0.3 cm) skin.

A during real-time scanning, an additional irregular hypoechoic mass
was identified at the 2 o'clock position 7 cm from the nipple. This
corresponds with the palpable lump in this region. It measures 2.1 x
1.5 x 0.7 cm. There is no definite associated vascularity.
Evaluation of the left axilla demonstrates no suspicious
lymphadenopathy.
IMPRESSION: 1. Indeterminate, incidentally found left breast mass at the 2
o'clock position 7 cm from the nipple. Recommendation is for
ultrasound-guided biopsy.
2. No suspicious left axillary lymphadenopathy.
3. Stable, probably benign left breast calcifications.
4. Benign left breast mass at the 3 o'clock position demonstrating 2
year stability. No further imaging follow-up required.

RECOMMENDATION:
1. Ultrasound-guided biopsy of the left breast mass along the 2
o'clock axis.
2. Pending biopsy results, if this demonstrates a surgical lesion,
stereotactic biopsy of additional calcification is recommended.

I have discussed the findings and recommendations with the patient.
If applicable, a reminder letter will be sent to the patient
regarding the next appointment.

BI-RADS CATEGORY  4: Suspicious.

ADDENDUM:
This is an addendum to the report originally dictated on [DATE].
The patient presented for a palpable lump along the 2 o'clock axis
of the left breast felt by her referring provider. This lump was
evaluated and recommended for biopsy. The impression section of the
report should read as follows.
IMPRESSION: 1. Indeterminate left breast mass along the 2 o'clock position 7 cm
from the nipple, corresponding with the physician palpated lump.
Recommendation is for ultrasound-guided biopsy. This was performed
to follow. Please see additional reports.
2. No suspicious left axillary lymphadenopathy.
3. Stable, probably benign left breast calcifications.
4. Benign left breast mass at the 3 o'clock position demonstrating 2
year stability. No further imaging follow-up required.

The remainder of the report including the recommendation section is
unchanged.

*** End of Addendum ***
ACR Breast Density Category d: The breast tissue is extremely dense,
which lowers the sensitivity of mammography.
FINDINGS: Loosely grouped and scattered punctate calcifications throughout the
upper inner quadrant of the left breast are mammographically stable.
Note is made of associated post biopsy changes. Otherwise no
suspicious findings in the remainder of the left breast.

Targeted ultrasound is performed, showing stable appearance of an
oval, circumscribed hypoechoic mass at the 3 o'clock position 6 cm
from the nipple. It measures 1.1 x 0.7 x 0.3 cm (previously 1.1 x
0.8 x 0.3 cm) skin.

A during real-time scanning, an additional irregular hypoechoic mass
was identified at the 2 o'clock position 7 cm from the nipple. This
corresponds with the palpable lump in this region. It measures 2.1 x
1.5 x 0.7 cm. There is no definite associated vascularity.
Evaluation of the left axilla demonstrates no suspicious
lymphadenopathy.
IMPRESSION: 1. Indeterminate, incidentally found left breast mass at the 2
o'clock position 7 cm from the nipple. Recommendation is for
ultrasound-guided biopsy.
2. No suspicious left axillary lymphadenopathy.
3. Stable, probably benign left breast calcifications.
4. Benign left breast mass at the 3 o'clock position demonstrating 2
year stability. No further imaging follow-up required.

RECOMMENDATION:
1. Ultrasound-guided biopsy of the left breast mass along the 2
o'clock axis.
2. Pending biopsy results, if this demonstrates a surgical lesion,
stereotactic biopsy of additional calcification is recommended.

I have discussed the findings and recommendations with the patient.
If applicable, a reminder letter will be sent to the patient
regarding the next appointment.

BI-RADS CATEGORY  4: Suspicious.

## 2021-05-26 ENCOUNTER — Ambulatory Visit
Admission: RE | Admit: 2021-05-26 | Discharge: 2021-05-26 | Disposition: A | Discharge status: Home / Self Care | Payer: BC Managed Care – PPO | Source: Ambulatory Visit | Attending: Obstetrics and Gynecology | Admitting: Obstetrics and Gynecology

## 2021-05-26 ENCOUNTER — Other Ambulatory Visit: Payer: Self-pay

## 2021-05-26 DIAGNOSIS — N6320 Unspecified lump in the left breast, unspecified quadrant: Secondary | ICD-10-CM

## 2021-05-26 IMAGING — US US BREAST BX W LOC DEV 1ST LESION IMG BX SPEC US GUIDE*L*
1 series · 12 of 13 positions shown · non-contrast
Comparison: Previous exam(s).
COMPARISON: Previous exam(s).

Addendum:
CLINICAL DATA: 45-year-old female for tissue sampling of 2.2 cm
UPPER-OUTER LEFT breast mass.

EXAM:
ULTRASOUND GUIDED LEFT BREAST CORE NEEDLE BIOPSY

[Series 1: us breast bx w loc dev 1st lesion img bx spec us g · 0.04mm/px · 12 of 13 slices shown]
[im 1/13]
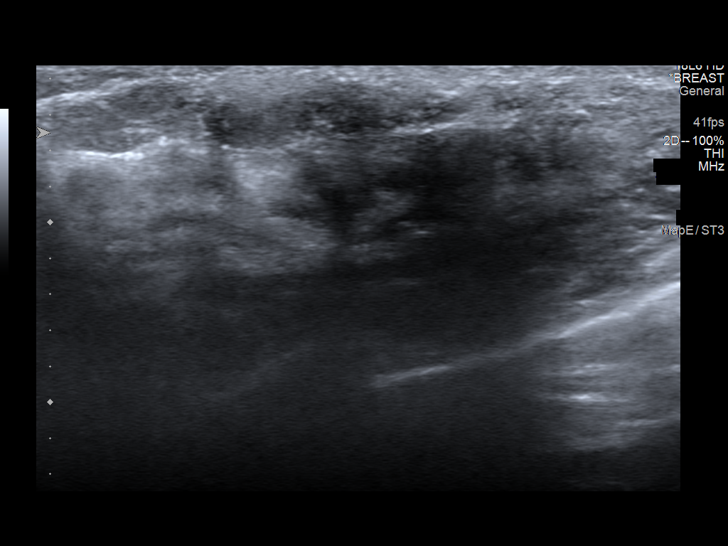
[im 2/13]
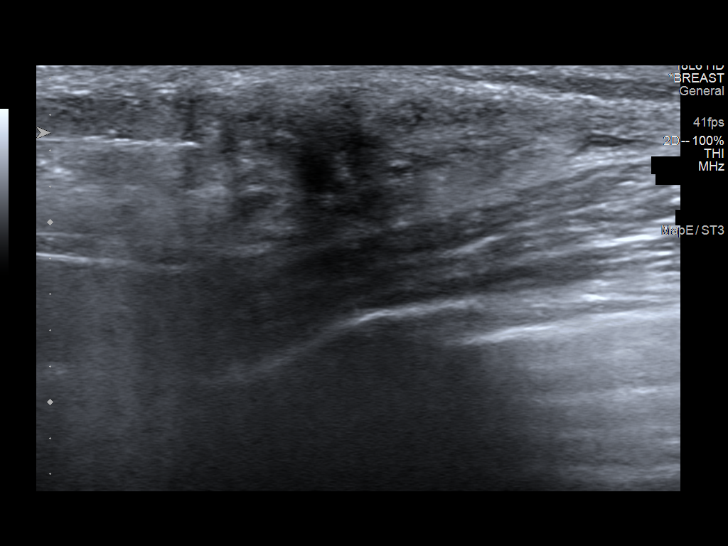
[im 3/13]
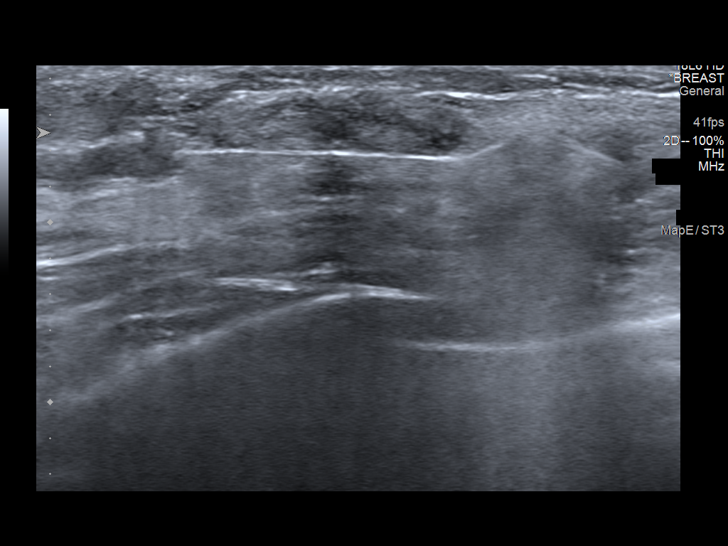
[im 4/13]
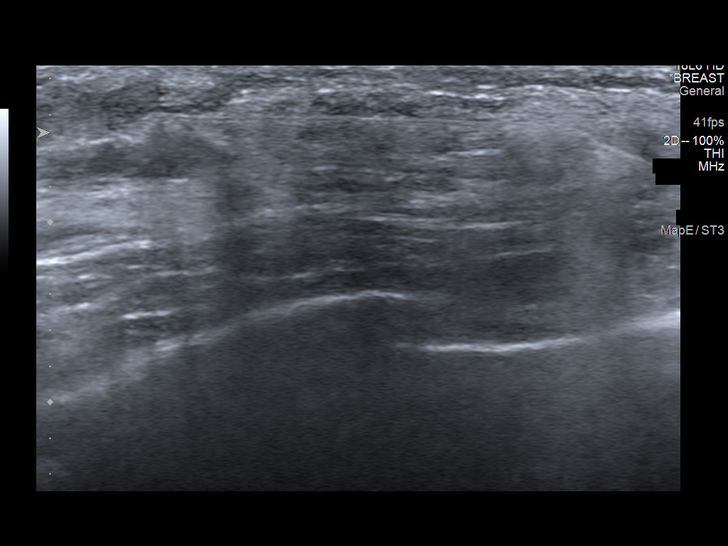
[im 5/13]
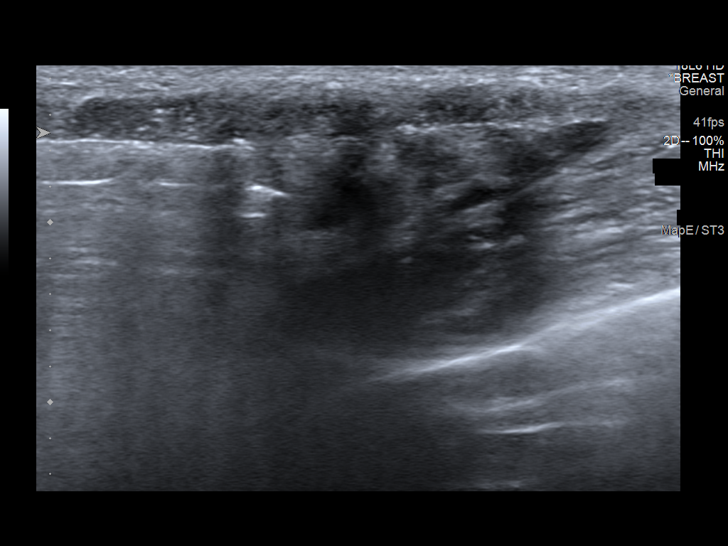
[im 6/13]
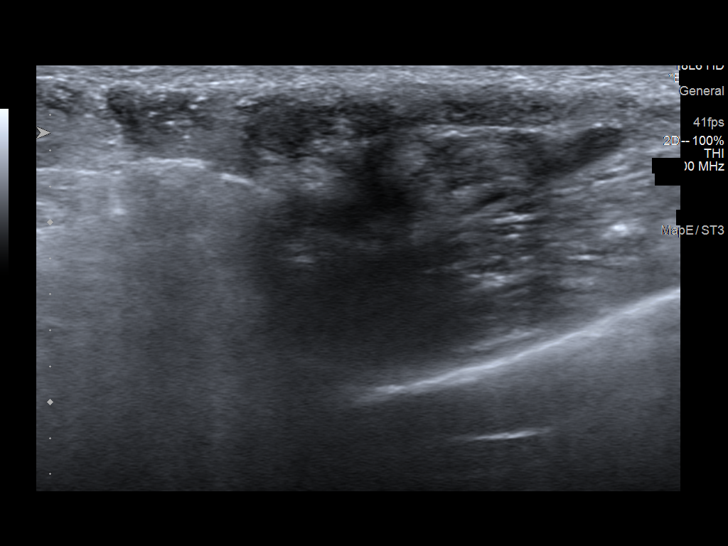
[im 8/13]
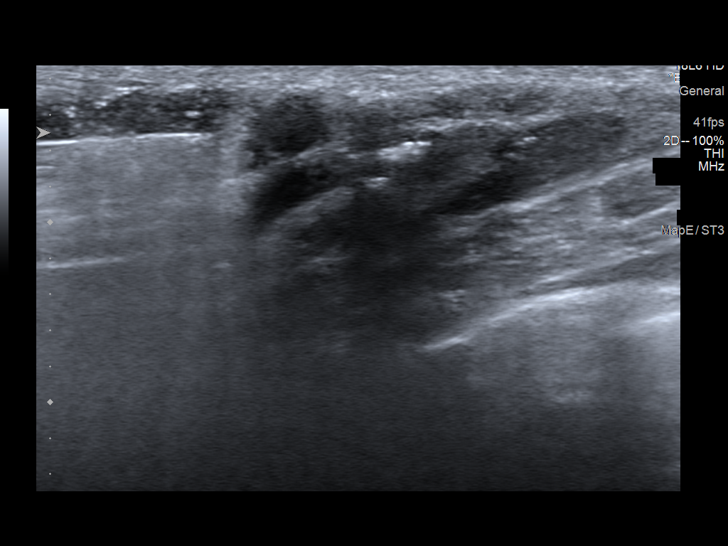
[im 9/13]
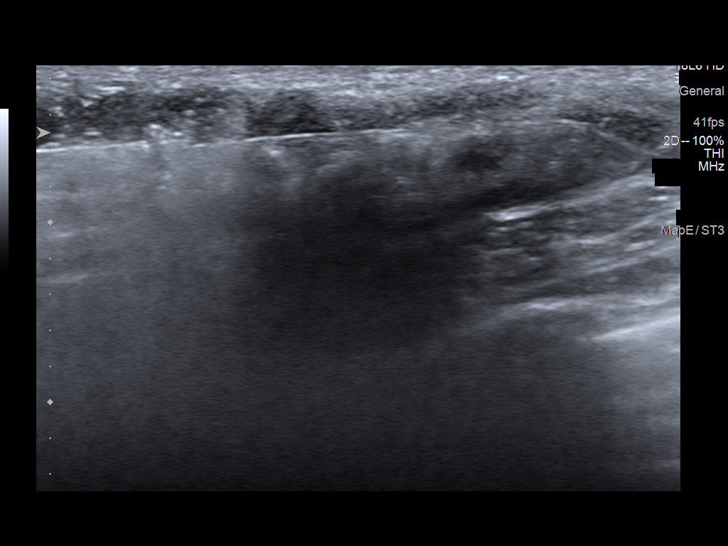
[im 10/13]
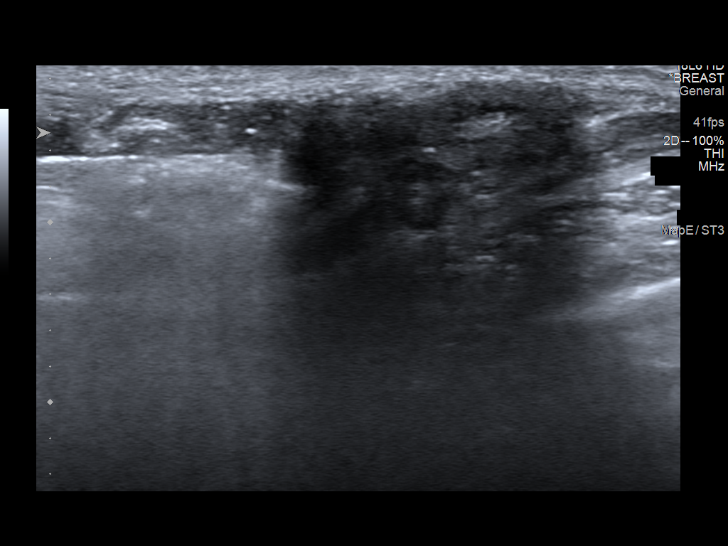
[im 11/13]
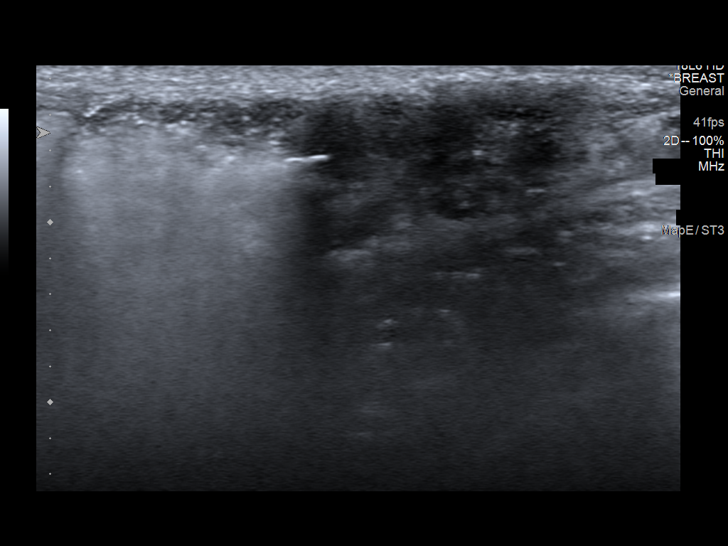
[im 12/13]
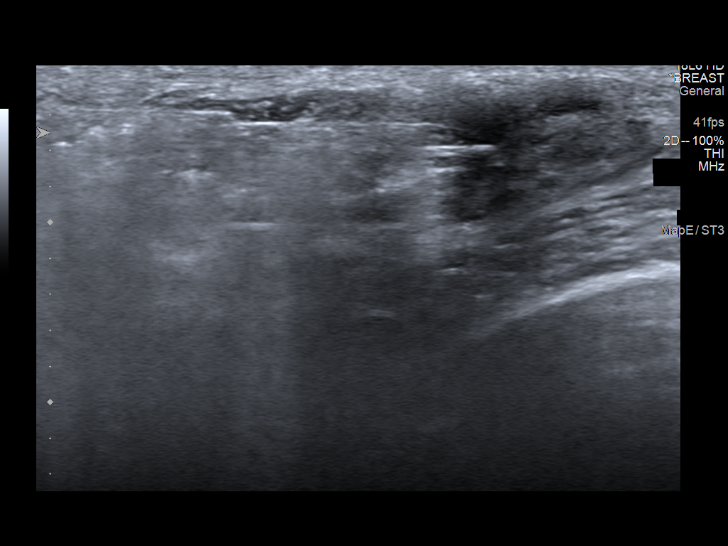
[im 13/13]
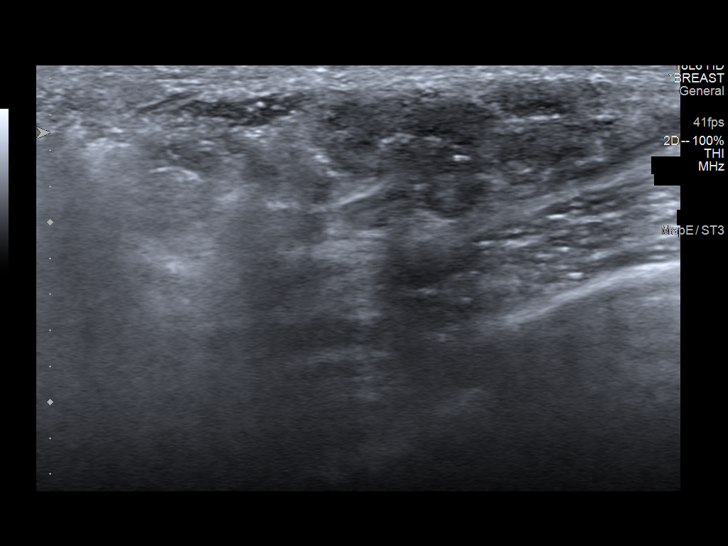

[12 of 13 positions shown; findings below may reference images not displayed]



Lesion quadrant: UPPER-OUTER LEFT breast

Using sterile technique and 1% Lidocaine as local anesthetic, under
direct ultrasound visualization, a 12 gauge GORGE device was
used to perform biopsy of the 2.2 cm hypoechoic mass at the 2
o'clock position of the LEFT breast using a at MEDIAL approach. At
the conclusion of the procedure a RIBBON tissue marker clip was
deployed into the biopsy cavity. Follow up 2 view mammogram was
performed and dictated separately.
IMPRESSION: Ultrasound guided biopsy of 2.2 cm UPPER-OUTER LEFT breast mass. No
apparent complications.

ADDENDUM:
Pathology revealed PSEUDOANGIOMATOUS STROMAL HYPERPLASIA (PASH),
FOCAL PERIDUCTAL INFLAMMATION WITH CALCIFICATIONS, FOCAL
FIBROADENOMATOID CHANGE of the LEFT breast, 2:00 o'clock. This was
found to be concordant by Dr. GORGE.

Pathology results were discussed with the patient's GORGE
GORGE by telephone, per request. Ms. GORGE reported her mother
did well after the biopsy with tenderness at the site. Post biopsy
instructions and care were reviewed and questions were answered. Ms.
GORGE was encouraged to call [REDACTED] for any additional concerns. My direct phone number was
provided.

The patient was instructed to return for LEFT diagnostic mammography
in [DATE].

Pathology results reported by GORGE, RN on [DATE].



Lesion quadrant: UPPER-OUTER LEFT breast

Using sterile technique and 1% Lidocaine as local anesthetic, under
direct ultrasound visualization, a 12 gauge GORGE device was
used to perform biopsy of the 2.2 cm hypoechoic mass at the 2
o'clock position of the LEFT breast using a at MEDIAL approach. At
the conclusion of the procedure a RIBBON tissue marker clip was
deployed into the biopsy cavity. Follow up 2 view mammogram was
performed and dictated separately.
IMPRESSION: Ultrasound guided biopsy of 2.2 cm UPPER-OUTER LEFT breast mass. No
apparent complications.

## 2021-08-17 ENCOUNTER — Encounter: Payer: Self-pay | Admitting: Genetic Counselor

## 2021-08-26 NOTE — Progress Notes (Signed)
Patient Care Team: Tracey Harries, MD as PCP - General (Obstetrics and Gynecology) Serena Croissant, MD as Consulting Physician (Hematology and Oncology) Lonie Peak, MD as Attending Physician (Radiation Oncology)  DIAGNOSIS:    ICD-10-CM   1. 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, estrogen receptor positive (HCC)  09/30/2018 Surgery   Right mastectomy with axillary lymph node dissection in [place]: Grade 2 IDC, 5.3 cm, DCIS, margins negative, lymphovascular invasion present, 0/18 lymph nodes, ER 60%, PR 0%, HER-2 negative, Ki-67 90%, T3N0 stage II B   11/15/2018 Cancer Staging   Staging form: Breast, AJCC 8th Edition - Pathologic: Stage IIB (pT3, pN0, cM0, G2, ER+, PR-, HER2-) - Signed by Serena Croissant, MD on 11/15/2018   11/29/2018 Oncotype testing   Oncotype DX score 28: 17% risk of distant recurrence at 9 years   12/08/2018 Genetic Testing   Negative genetic testing on the common hereditary cancer panel.  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 The Hereditary Gene Panel offered by [facility] includes sequencing and/or deletion duplication testing of the following 47 genes: APC, ATM, AXIN2, BARD1, BMPR1A, BRCA1, BRCA2, BRIP1, CDH1, CDK4, CDKN2A (p14ARF), CDKN2A (p16INK4a), CHEK2, CTNNA1, DICER1, EPCAM (Deletion/duplication testing only), GREM1 (promoter region deletion/duplication testing only), KIT, MEN1, MLH1, MSH2, MSH3, MSH6, MUTYH, NBN, NF1, NHTL1, PALB2, PDGFRA, PMS2, POLD1, POLE, PTEN, RAD50, RAD51C, RAD51D, SDHB, SDHC, SDHD, SMAD4, SMARCA4. STK11, TP53, TSC1, TSC2, and VHL.  The following genes were evaluated for sequence changes only: SDHA and HOXB13 c.251G>A variant only. The report date is December 08, 2018.  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  The amended report date is 08/12/2021.   12/29/2018 -  Chemotherapy   Adjuvant chemotherapy with dose dense Adriamycin and Cytoxan followed by Taxol.     05/31/2019 - 07/11/2019 Radiation Therapy   The patient initially received a dose of 50 Gy in 25 fractions to the breast  and 45 Gy in 25 fractions to the scv using whole-breast tangent fields. This was delivered using a 3-D conformal technique. The pt received a boost delivering an additional 10 Gy in 5 fractions using a electron boost with electrons. The total dose was 105 Gy.    07/2019 - 07/2029 Anti-estrogen oral therapy   Tamoxifen 20mg  daily x10 years started 07/10/2019 discontinued December 2020, started anastrozole half a tablet daily 05/27/2020 discontinued in 1 week.  Starting letrozole 08/27/2021     CHIEF COMPLIANT: Follow-up of right breast cancer  INTERVAL HISTORY: Ariana Luna is a 45 y.o. with above-mentioned history of right breast cancer treated with right mastectomy in 54 and who completed adjuvant chemotherapy, radiation, and is currently on anti-estrogen therapy with anastrozole. Mammogram on 05/12/2021 showed indeterminate left breast mass along the 2 o'clock position 7 cm from the nipple. Biopsy on 05/26/2021 showed no evidence of malignancy. She presents to the clinic today for follow-up.  She complains of pain along the surgical scars on the left chest wall especially at the site of the port  ALLERGIES:  has No Known Allergies.  MEDICATIONS:  Current Outpatient Medications  Medication Sig Dispense Refill   letrozole (FEMARA) 2.5 MG tablet Take 1 tablet (2.5 mg total) by mouth daily. 90 tablet 3   No current facility-administered medications for this visit.  PHYSICAL EXAMINATION: ECOG PERFORMANCE STATUS: 1 - Symptomatic but completely ambulatory  Vitals:   08/27/21 1542  BP: 109/75  Pulse: 79  Resp: 18  Temp: 97.9 F (36.6 C)  SpO2: 100%   Filed Weights    08/27/21 1542  Weight: 122 lb (55.3 kg)    BREAST: No palpable lumps or nodules in the left breast.  There is tenderness along the surgical scar of the port.  Right chest wall is without any lumps or nodules.  (exam performed in the presence of a chaperone)  LABORATORY DATA:  I have reviewed the data as listed CMP Latest Ref Rng & Units 05/27/2020 05/10/2019 05/04/2019  Glucose 70 - 99 mg/dL 114(H) 103(H) 103(H)  BUN 6 - 20 mg/dL $Remove'13 14 11  'QjVZeIy$ Creatinine 0.44 - 1.00 mg/dL 1.25(H) 0.64 0.66  Sodium 135 - 145 mmol/L 140 139 138  Potassium 3.5 - 5.1 mmol/L 3.9 3.9 3.7  Chloride 98 - 111 mmol/L 103 103 105  CO2 22 - 32 mmol/L $RemoveB'28 26 24  'UQqExski$ Calcium 8.9 - 10.3 mg/dL 9.5 9.1 9.1  Total Protein 6.5 - 8.1 g/dL 7.6 7.3 7.2  Total Bilirubin 0.3 - 1.2 mg/dL 0.5 0.4 0.3  Alkaline Phos 38 - 126 U/L 174(H) 115 125  AST 15 - 41 U/L 131(H) 53(H) 66(H)  ALT 0 - 44 U/L 216(H) 75(H) 94(H)    Lab Results  Component Value Date   WBC 4.3 05/27/2020   HGB 13.4 05/27/2020   HCT 41.2 05/27/2020   MCV 89.2 05/27/2020   PLT 149 (L) 05/27/2020   NEUTROABS 2.2 05/27/2020    ASSESSMENT & PLAN:  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  Adjuvant chemotherapy with dose dense Adriamycin and Cytoxan x4 followed by Taxol weekly x12 completed 05/10/2019 2.  Adjuvant radiation therapy because of the tumor size being large: Started 05/31/2019 completed 07/11/2019 3.  Followed by adjuvant antiestrogen therapy with tamoxifen 20 mg daily x10 years started 07/10/2019 discontinued December 2020, started anastrozole half a tablet daily  05/27/2020 ------------------------------------------------------------------------------------------------------------------- Current treatment: Could not tolerate tamoxifen even half a tablet.  Anastrozole 1 mg daily (patient did not take it for more than a few days and discontinued it) starting letrozole 08/27/2021 Patient stopped taking anastrozole because she did not like the way it made her feel.  She did not inform us.  I strongly recommended that she take another antiestrogen therapy.  Because of this I would start her on letrozole.   Breast cancer surveillance: left breast mammogram 05/12/2021: Indeterminate left breast mass at 2 o'clock position, benign left breast mass at 3 o'clock position, ultrasound-guided biopsy 05/26/2021:PASH   Return to clinic in 1 year for follow-up  No orders of the defined types were placed in this encounter.  The patient has a good understanding of the overall plan. she agrees with it. she will call with any problems that may develop before the next visit here.  Total time spent: 20 mins including face to face time and time spent for planning, charting and coordination of care  Rulon Eisenmenger, MD, MPH 08/27/2021  I, Thana Ates, am acting as scribe for Dr. Nicholas Lose.  I have reviewed the above  documentation for accuracy and completeness, and I agree with the above.

## 2021-08-27 ENCOUNTER — Other Ambulatory Visit: Payer: Self-pay

## 2021-08-27 ENCOUNTER — Inpatient Hospital Stay: Payer: BC Managed Care – PPO | Attending: Hematology and Oncology | Admitting: Hematology and Oncology

## 2021-08-27 DIAGNOSIS — Z923 Personal history of irradiation: Secondary | ICD-10-CM | POA: Insufficient documentation

## 2021-08-27 DIAGNOSIS — C50011 Malignant neoplasm of nipple and areola, right female breast: Secondary | ICD-10-CM | POA: Diagnosis not present

## 2021-08-27 DIAGNOSIS — Z79811 Long term (current) use of aromatase inhibitors: Secondary | ICD-10-CM | POA: Diagnosis not present

## 2021-08-27 DIAGNOSIS — Z9011 Acquired absence of right breast and nipple: Secondary | ICD-10-CM | POA: Diagnosis not present

## 2021-08-27 DIAGNOSIS — Z170 Estrogen receptor positive status [ER+]: Secondary | ICD-10-CM | POA: Insufficient documentation

## 2021-08-27 MED ORDER — LETROZOLE 2.5 MG PO TABS
2.5000 mg | ORAL_TABLET | Freq: Every day | ORAL | 3 refills | Status: DC
Start: 2021-08-27 — End: 2022-08-30

## 2021-08-27 NOTE — Assessment & Plan Note (Signed)
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 Adjuvant radiation therapy because of the tumor size being large: Started 7/23/2020completed 07/11/2019 3. Followed by adjuvant antiestrogen therapy with tamoxifen 20 mg daily x10 yearsstarted 9/1/2020discontinued December 2020, started anastrozole half a tablet daily 05/27/2020 ------------------------------------------------------------------------------------------------------------------- Current treatment: Could not tolerate tamoxifen even half a tablet.  Anastrozole 1 mg daily Anastrozole toxicities:tolerating it well except for fatigue  Breast cancer surveillance: left breast mammogram 05/12/2021: Indeterminate left breast mass at 2 o'clock position, benign left breast mass at 3 o'clock position, ultrasound-guided biopsy 05/26/2021:PASH    Return to clinic in 1 year for follow-up

## 2021-09-15 ENCOUNTER — Telehealth: Payer: Self-pay | Admitting: Hematology and Oncology

## 2021-09-15 NOTE — Telephone Encounter (Signed)
Called and left msg for patient to call back to schedule appt at her convenience

## 2021-09-17 ENCOUNTER — Other Ambulatory Visit: Payer: Self-pay | Admitting: *Deleted

## 2021-09-17 ENCOUNTER — Other Ambulatory Visit: Payer: Self-pay

## 2021-09-17 ENCOUNTER — Ambulatory Visit (HOSPITAL_COMMUNITY)
Admission: RE | Admit: 2021-09-17 | Discharge: 2021-09-17 | Disposition: A | Discharge status: Home / Self Care | Payer: BC Managed Care – PPO | Source: Ambulatory Visit | Attending: Hematology and Oncology | Admitting: Hematology and Oncology

## 2021-09-17 DIAGNOSIS — R519 Headache, unspecified: Secondary | ICD-10-CM | POA: Diagnosis present

## 2021-09-17 DIAGNOSIS — C50011 Malignant neoplasm of nipple and areola, right female breast: Secondary | ICD-10-CM

## 2021-09-17 DIAGNOSIS — Z170 Estrogen receptor positive status [ER+]: Secondary | ICD-10-CM | POA: Diagnosis present

## 2021-09-17 IMAGING — MR MR HEAD WO/W CM
13 series · 48 of 48 positions shown · IV contrast (gadavist)
Comparison: None.

CLINICAL DATA: Breast cancer, staging.

EXAM:
MRI HEAD WITHOUT AND WITH CONTRAST
TECHNIQUE: Multiplanar, multiecho pulse sequences of the brain and surrounding
structures were obtained without and with intravenous contrast.
CONTRAST:  5mL GADAVIST GADOBUTROL 1 MMOL/ML IV SOLN

[Series 5: DWI · axial · 3.0mm · 1.36mm/px · z∈[-62,+95]mm · 6 of 106 slices shown (1 of 2)]
[im 1/106]
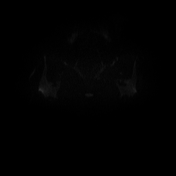
[im 22/106]
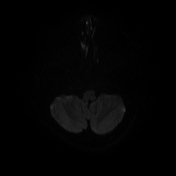
[im 43/106]
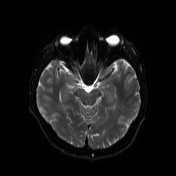
[im 64/106]
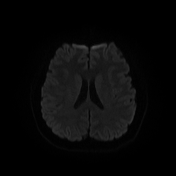
[im 85/106]
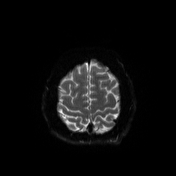
[im 106/106]
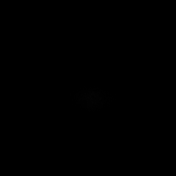

[Series 6: DWI · axial · 3.0mm · 1.36mm/px · z∈[-62,+95]mm · 3 of 53 slices shown (2 of 2)]
[im 1/53]
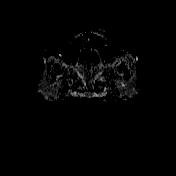
[im 27/53]
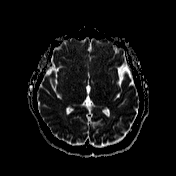
[im 53/53]
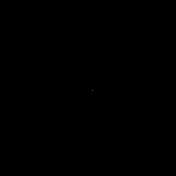

[Series 7: T1 · sagittal · 5.0mm · 0.75mm/px · 1 of 24 slices shown (1 of 2)]
[im 1/24]
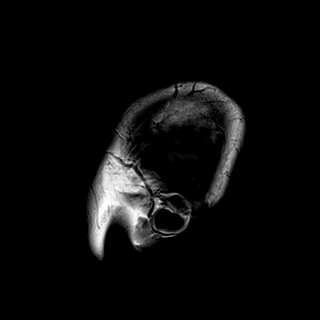

[Series 8: T2 · axial · 5.0mm · 0.62mm/px · z∈[-53,+108]mm · 2 of 26 slices shown]
[im 1/26]
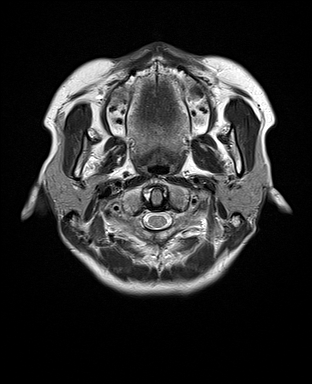
[im 26/26]
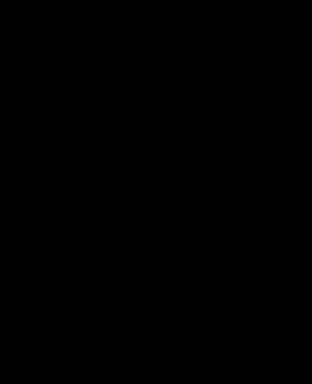

[Series 9: swi_images · axial · 3.0mm · 0.75mm/px · z∈[-54,+110]mm · 3 of 56 slices shown]
[im 1/56]
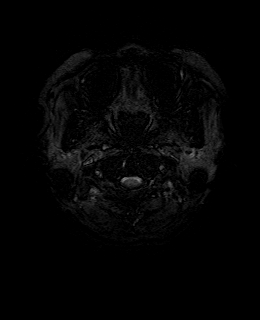
[im 28/56]
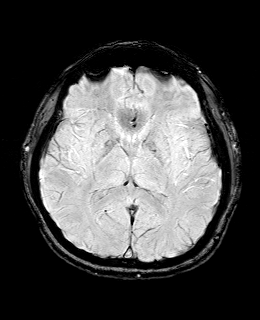
[im 56/56]
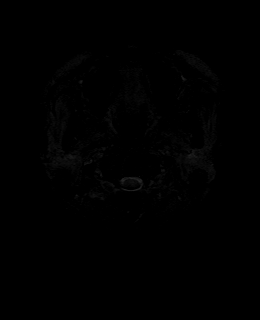

[Series 11: FLAIR · axial · 3.0mm · 0.75mm/px · z∈[-48,+104]mm · 3 of 52 slices shown]
[im 1/52]
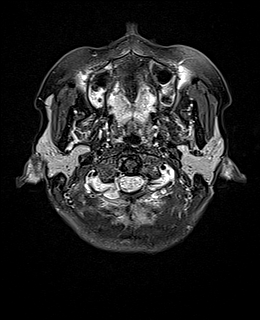
[im 26/52]
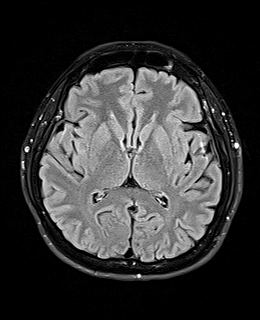
[im 52/52]
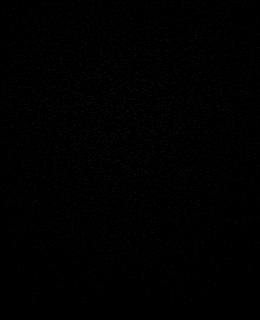

[Series 12: T1 · axial · 1.0mm · 0.94mm/px · z∈[-59,+97]mm · 10 of 160 slices shown (2 of 2)]
[im 1/160]
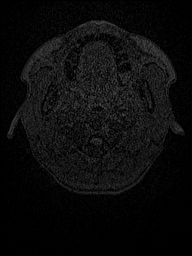
[im 18/160]
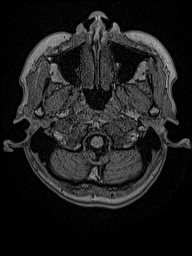
[im 36/160]
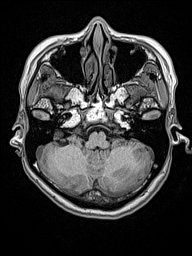
[im 54/160]
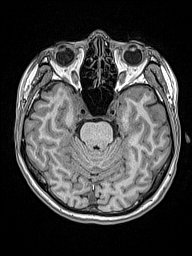
[im 71/160]
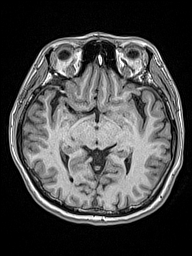
[im 89/160]
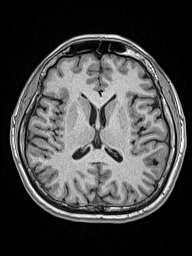
[im 107/160]
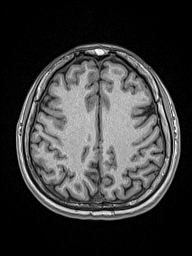
[im 124/160]
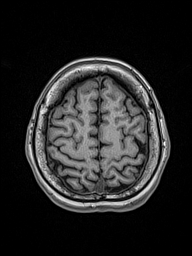
[im 142/160]
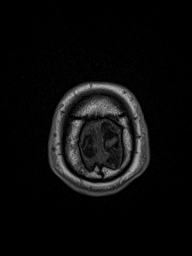
[im 160/160]
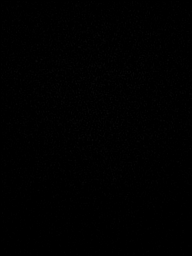

[Series 13: cor dwi_tracew · coronal · 5.0mm · 1.53mm/px · 3 of 52 slices shown]
[im 1/52]
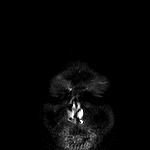
[im 26/52]
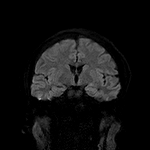
[im 52/52]
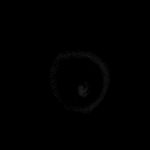

[Series 14: cor dwi_adc · coronal · 5.0mm · 1.53mm/px · 2 of 26 slices shown]
[im 1/26]
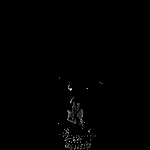
[im 26/26]
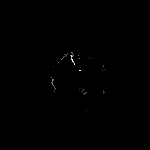

[Series 15: T2 post-contrast · coronal · 5.0mm · 0.57mm/px · 2 of 29 slices shown]
[im 1/29]
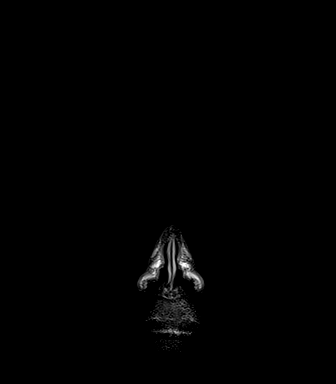
[im 29/29]
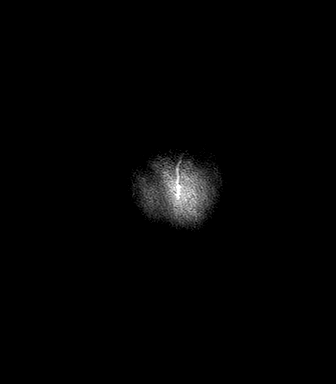

[Series 16: T1 post-contrast · axial · 1.0mm · 0.94mm/px · z∈[-59,+97]mm · 10 of 160 slices shown (1 of 3)]
[im 1/160]
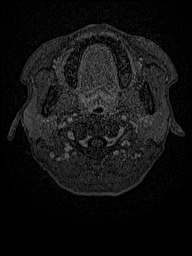
[im 18/160]
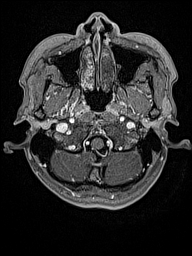
[im 36/160]
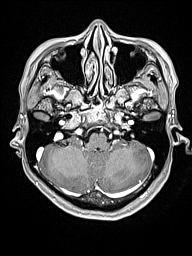
[im 54/160]
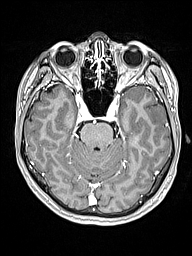
[im 71/160]
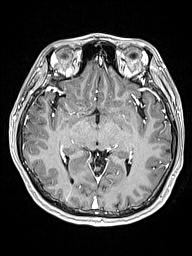
[im 89/160]
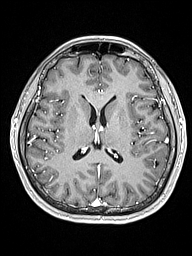
[im 107/160]
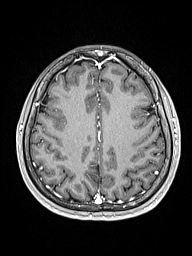
[im 124/160]
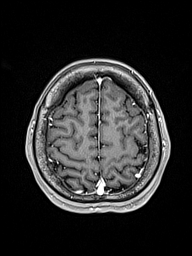
[im 142/160]
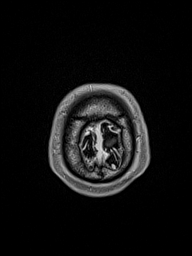
[im 160/160]
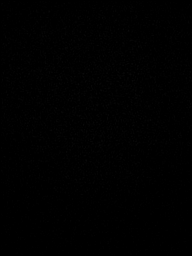

[Series 17: T1 post-contrast · coronal · 5.0mm · 0.43mm/px · 2 of 29 slices shown (2 of 3)]
[im 1/29]
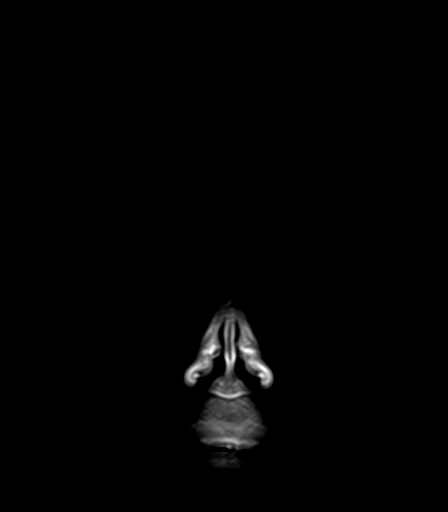
[im 29/29]
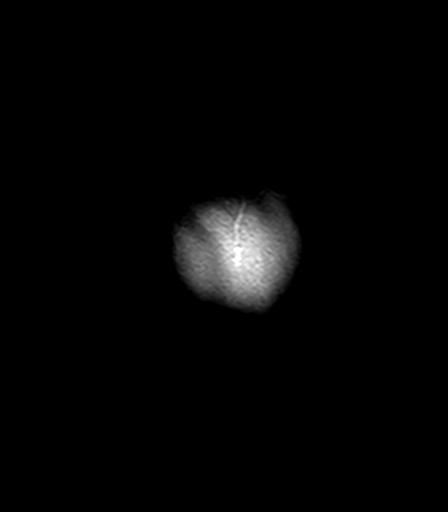

[Series 18: T1 post-contrast · sagittal · 5.0mm · 0.75mm/px · 1 of 24 slices shown (3 of 3)]
[im 1/24]
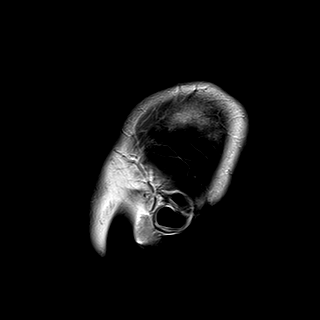

[48 of 48 positions shown; findings below may reference images not displayed]

FINDINGS: Brain: No acute infarction, hemorrhage, hydrocephalus, extra-axial
collection or mass lesion. The brain parenchyma has normal
morphology and signal characteristics. No focus of abnormal contrast
enhancement.

Vascular: Normal flow voids.

Skull and upper cervical spine: Normal marrow signal.

Sinuses/Orbits: Negative.

Other: None.
IMPRESSION: Unremarkable MRI of the brain. No evidence of intracranial
metastatic disease.

## 2021-09-17 MED ORDER — GADOBUTROL 1 MMOL/ML IV SOLN
5.0000 mL | Freq: Once | INTRAVENOUS | Status: AC | PRN
  Administered 2021-09-17: 5 mL via INTRAVENOUS

## 2021-09-17 NOTE — Progress Notes (Signed)
Received call from pt with complaint of severe headache x 2 weeks.  Pt also report severe nausea and vomiting that occurs throughout the night, every night.  Per MD pt needing STAT brain MRI.  Orders placed, appt scheduled and pt verbalized understanding of date and time of appt.

## 2021-09-18 ENCOUNTER — Inpatient Hospital Stay: Payer: BC Managed Care – PPO

## 2021-09-18 ENCOUNTER — Inpatient Hospital Stay: Payer: BC Managed Care – PPO | Attending: Hematology and Oncology | Admitting: Hematology and Oncology

## 2021-09-18 DIAGNOSIS — Z923 Personal history of irradiation: Secondary | ICD-10-CM | POA: Diagnosis not present

## 2021-09-18 DIAGNOSIS — R112 Nausea with vomiting, unspecified: Secondary | ICD-10-CM | POA: Diagnosis not present

## 2021-09-18 DIAGNOSIS — Z9011 Acquired absence of right breast and nipple: Secondary | ICD-10-CM | POA: Insufficient documentation

## 2021-09-18 DIAGNOSIS — C50011 Malignant neoplasm of nipple and areola, right female breast: Secondary | ICD-10-CM | POA: Diagnosis not present

## 2021-09-18 DIAGNOSIS — R519 Headache, unspecified: Secondary | ICD-10-CM | POA: Insufficient documentation

## 2021-09-18 DIAGNOSIS — Z9221 Personal history of antineoplastic chemotherapy: Secondary | ICD-10-CM | POA: Insufficient documentation

## 2021-09-18 DIAGNOSIS — Z170 Estrogen receptor positive status [ER+]: Secondary | ICD-10-CM | POA: Insufficient documentation

## 2021-09-18 DIAGNOSIS — Z79811 Long term (current) use of aromatase inhibitors: Secondary | ICD-10-CM | POA: Insufficient documentation

## 2021-09-18 NOTE — Assessment & Plan Note (Signed)
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 Adjuvant radiation therapy because of the tumor size being large: Started 7/23/2020completed 07/11/2019 3. Followed by adjuvant antiestrogen therapy with tamoxifen 20 mg daily x10 yearsstarted 9/1/2020discontinued December 2020, started anastrozole half a tablet daily 05/27/2020 ------------------------------------------------------------------------------------------------------------------- Current treatment: Could not tolerate tamoxifen even half a tablet. 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

## 2021-09-18 NOTE — Progress Notes (Signed)
Patient Care Team: Ariana Pigg, MD as PCP - General (Obstetrics and Gynecology) Ariana Lose, MD as Consulting Physician (Hematology and Oncology) Ariana Gibson, MD as Attending Physician (Radiation Oncology)  DIAGNOSIS:  Encounter Diagnosis  Name Primary?   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 The Hereditary Gene Panel offered by [facility] includes sequencing and/or deletion duplication testing of the following 47 genes: APC, ATM, AXIN2, BARD1, BMPR1A, BRCA1, BRCA2, BRIP1, CDH1, CDK4, CDKN2A (p14ARF), CDKN2A (p16INK4a), CHEK2, CTNNA1, DICER1, EPCAM (Deletion/duplication testing only), GREM1 (promoter region deletion/duplication testing only), KIT, MEN1, MLH1, MSH2, MSH3, MSH6, MUTYH, NBN, NF1, NHTL1, PALB2, PDGFRA, PMS2, POLD1, POLE, PTEN, RAD50, RAD51C, RAD51D, SDHB, SDHC, SDHD, SMAD4, SMARCA4. STK11, TP53, TSC1, TSC2, and VHL.  The following genes were evaluated for sequence changes only: SDHA and HOXB13 c.251G>A variant only. The report date is December 08, 2018.  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  The amended report date is 08/12/2021.   12/29/2018 -  Chemotherapy   Adjuvant chemotherapy with dose dense Adriamycin and Cytoxan followed by Taxol.     05/31/2019 - 07/11/2019 Radiation Therapy   The patient initially received a dose of 50 Gy in 25 fractions to the breast  and 45 Gy in 25 fractions to the scv using whole-breast tangent fields. This was delivered using a 3-D conformal technique. The pt received a boost delivering an additional 10 Gy in 5 fractions using a electron boost with 34mV electrons. The total dose was 105 Gy.    07/2019 - 07/2029 Anti-estrogen oral therapy   Tamoxifen 294mdaily x10 years started 07/10/2019 discontinued December 2020, started anastrozole half a tablet daily 05/27/2020 discontinued in 1 week.  Starting letrozole 08/27/2021     CHIEF COMPLIANT: Complains of headaches accompanied by nausea and vomiting  INTERVAL HISTORY: Ariana Luna a [age]ear old with above-mentioned history of breast cancer who [place] acute onset of nausea vomiting.  Ariana Luna said that these have been going on for the past several months and over the past week it has gotten slightly worse.  These are all associated with her intense headache from migraines.  We performed a brain MRI and is here today to discuss results.   ALLERGIES:  has No Known Allergies.  MEDICATIONS:  Current Outpatient Medications  Medication Sig Dispense Refill   letrozole (FEMARA) 2.5 MG tablet Take 1 tablet (2.5 mg total) by mouth daily. 90 tablet 3   No current facility-administered medications for this visit.    PHYSICAL EXAMINATION: ECOG PERFORMANCE STATUS: 1 - Symptomatic but completely ambulatory  Vitals:   09/18/21 1153  BP: 106/80  Pulse: 74  Resp: 16  Temp: 98.1 F (36.7 C)  SpO2: 100%   Filed Weights   09/18/21 1153  Weight: 120 lb 8 oz (54.7 kg)      LABORATORY DATA:  I have reviewed the data as listed CMP Latest Ref Rng & Units  05/27/2020 05/10/2019 05/04/2019  Glucose 70 - 99 mg/dL 114(H) 103(H) 103(H)  BUN 6 - 20 mg/dL 13 14 11   Creatinine 0.44 - 1.00 mg/dL 1.25(H) 0.64 0.66  Sodium 135 - 145 mmol/L 140 139 138  Potassium 3.5 - 5.1 mmol/L 3.9 3.9 3.7  Chloride 98 - 111 mmol/L 103 103 105  CO2 22 - 32 mmol/L 28 26 24   Calcium 8.9 - 10.3 mg/dL 9.5 9.1 9.1  Total Protein 6.5 - 8.1 g/dL 7.6 7.3 7.2  Total Bilirubin 0.3 - 1.2 mg/dL 0.5 0.4 0.3  Alkaline Phos 38 - 126 U/L 174(H) 115 125  AST 15 - 41 U/L 131(H) 53(H) 66(H)  ALT 0 - 44 U/L 216(H) 75(H) 94(H)    Lab Results  Component Value Date   WBC 4.3 05/27/2020   HGB 13.4 05/27/2020   HCT 41.2 05/27/2020   MCV 89.2 05/27/2020   PLT 149 (L) 05/27/2020   NEUTROABS 2.2 05/27/2020    ASSESSMENT & PLAN:  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  Adjuvant chemotherapy with dose dense Adriamycin and Cytoxan x4 followed by Taxol weekly x12 completed 05/10/2019 2.  Adjuvant radiation therapy because of the tumor size being large: Started 05/31/2019 completed 07/11/2019 3.  Followed by adjuvant antiestrogen therapy with tamoxifen 20 mg daily x10 years started 07/10/2019 discontinued December 2020, started anastrozole half a tablet daily 05/27/2020 ------------------------------------------------------------------------------------------------------------------- Current treatment: Could not tolerate tamoxifen even half a tablet.  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  Ariana Luna is taking medic medication from [place] for migraines.  I instructed her to talk to her primary care physician about preventive treatments for migraines.  No active oncologic issues at this point.   No orders of the defined types were placed in this encounter.  The patient has a good understanding of the overall plan. Ariana Luna agrees with it. Ariana Luna will call with any problems that may develop before the next visit here. Total time spent: 30 mins including face to face time and time spent for planning, charting and co-ordination of care   Harriette Ohara, MD 09/18/21

## 2022-05-08 ENCOUNTER — Other Ambulatory Visit: Payer: Self-pay | Admitting: Nurse Practitioner

## 2022-05-17 ENCOUNTER — Other Ambulatory Visit: Payer: Self-pay | Admitting: Obstetrics and Gynecology

## 2022-05-17 DIAGNOSIS — Z1231 Encounter for screening mammogram for malignant neoplasm of breast: Secondary | ICD-10-CM

## 2022-05-24 ENCOUNTER — Ambulatory Visit
Admission: RE | Admit: 2022-05-24 | Discharge: 2022-05-24 | Disposition: A | Discharge status: Home / Self Care | Payer: BC Managed Care – PPO | Source: Ambulatory Visit | Attending: Obstetrics and Gynecology | Admitting: Obstetrics and Gynecology

## 2022-05-24 DIAGNOSIS — Z1231 Encounter for screening mammogram for malignant neoplasm of breast: Secondary | ICD-10-CM

## 2022-06-08 ENCOUNTER — Other Ambulatory Visit: Payer: Self-pay | Admitting: Obstetrics and Gynecology

## 2022-06-08 DIAGNOSIS — Z9889 Other specified postprocedural states: Secondary | ICD-10-CM

## 2022-06-09 ENCOUNTER — Other Ambulatory Visit: Payer: Self-pay | Admitting: Obstetrics and Gynecology

## 2022-06-09 DIAGNOSIS — N6320 Unspecified lump in the left breast, unspecified quadrant: Secondary | ICD-10-CM

## 2022-06-17 ENCOUNTER — Ambulatory Visit
Admission: RE | Admit: 2022-06-17 | Discharge: 2022-06-17 | Disposition: A | Discharge status: Home / Self Care | Payer: BC Managed Care – PPO | Source: Ambulatory Visit | Attending: Obstetrics and Gynecology | Admitting: Obstetrics and Gynecology

## 2022-06-17 DIAGNOSIS — N6320 Unspecified lump in the left breast, unspecified quadrant: Secondary | ICD-10-CM

## 2022-08-26 NOTE — Progress Notes (Signed)
Patient Care Team: Newton Pigg, MD as PCP - General (Obstetrics and Gynecology) Nicholas Lose, MD as Consulting Physician (Hematology and Oncology) Eppie Gibson, MD as Attending Physician (Radiation Oncology)  DIAGNOSIS:  Encounter Diagnoses  Name Primary?   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 The Hereditary Gene Panel offered by [facility] includes sequencing and/or deletion duplication testing of the following 47 genes: APC, ATM, AXIN2, BARD1, BMPR1A, BRCA1, BRCA2, BRIP1, CDH1, CDK4, CDKN2A (p14ARF), CDKN2A (p16INK4a), CHEK2, CTNNA1, DICER1, EPCAM (Deletion/duplication testing only), GREM1 (promoter region deletion/duplication testing only), KIT, MEN1, MLH1, MSH2, MSH3, MSH6, MUTYH, NBN, NF1, NHTL1, PALB2, PDGFRA, PMS2, POLD1, POLE, PTEN, RAD50, RAD51C, RAD51D, SDHB, SDHC, SDHD, SMAD4, SMARCA4. STK11, TP53, TSC1, TSC2, and VHL.  The following genes were evaluated for sequence changes only: SDHA and HOXB13 c.251G>A variant only. The report date is December 08, 2018.  UPDATE: SMARCA4  c.407C>T (p.Ala136Val) VUS reclassified to Likely Benign.  The amended report date is 08/12/2021.   12/29/2018 -  Chemotherapy   Adjuvant chemotherapy with dose dense Adriamycin and Cytoxan followed by Taxol.     05/31/2019 - 07/11/2019 Radiation Therapy   The patient initially received a dose of 50 Gy in 25 fractions to the breast  and 45 Gy in 25 fractions to the scv using whole-breast tangent fields. This was delivered using a 3-D conformal technique. The pt received a boost delivering an additional 10 Gy in 5 fractions using a electron boost with 16mV electrons. The total dose was 105 Gy.    07/2019 - 07/2029 Anti-estrogen oral therapy   Tamoxifen 261mdaily x10 years started 07/10/2019 discontinued December 2020, started anastrozole half a tablet daily 05/27/2020 discontinued in 1 week.  Starting letrozole 08/27/2021     CHIEF COMPLIANT: Follow-up breast cancer  INTERVAL HISTORY: Ariana Luna a [age]ear old with above-mentioned history of breast cancer currently on letrozole. She presents to the clinic for a follow-up. She states that she is not taking the letrozole. She could not tolerate it. She says it makes her really tired and no energy. She complains of upset stomach it has happened twice and it hurts really bad. She states it happened twice 3 months ago. She reports when she was sitting and eating and started sweating really bad.    ALLERGIES:  has No Known Allergies.    PHYSICAL EXAMINATION: ECOG PERFORMANCE STATUS: 1 - Symptomatic but completely ambulatory  Vitals:   08/30/22 1549  BP: 103/74  Pulse: 76  Resp: 16  Temp: 97.7 F (36.5 C)  SpO2: 100%   Filed Weights   08/30/22 1549  Weight: 123 lb  1.6 oz (55.8 kg)      LABORATORY DATA:  I have reviewed the data as listed    Latest Ref Rng & Units 05/27/2020    2:47 PM 05/10/2019    1:37 PM 05/04/2019    8:25 AM  CMP  Glucose 70 - 99 mg/dL 114  103  103   BUN 6 - 20 mg/dL 13  14  11    Creatinine 0.44 - 1.00  mg/dL 1.25  0.64  0.66   Sodium 135 - 145 mmol/L 140  139  138   Potassium 3.5 - 5.1 mmol/L 3.9  3.9  3.7   Chloride 98 - 111 mmol/L 103  103  105   CO2 22 - 32 mmol/L 28  26  24    Calcium 8.9 - 10.3 mg/dL 9.5  9.1  9.1   Total Protein 6.5 - 8.1 g/dL 7.6  7.3  7.2   Total Bilirubin 0.3 - 1.2 mg/dL 0.5  0.4  0.3   Alkaline Phos 38 - 126 U/L 174  115  125   AST 15 - 41 U/L 131  53  66   ALT 0 - 44 U/L 216  75  94     Lab Results  Component Value Date   WBC 4.3 05/27/2020   HGB 13.4 05/27/2020   HCT 41.2 05/27/2020   MCV 89.2 05/27/2020   PLT 149 (L) 05/27/2020   NEUTROABS 2.2 05/27/2020    ASSESSMENT & PLAN:  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  Adjuvant chemotherapy with dose dense Adriamycin and Cytoxan x4 followed by Taxol weekly x12 completed 05/10/2019 2.  Adjuvant radiation therapy because of the tumor size being large: Started 05/31/2019 completed 07/11/2019 3.  Followed by adjuvant antiestrogen therapy with tamoxifen 20 mg daily x10 years started 07/10/2019 discontinued December 2020, started anastrozole half a tablet daily 05/27/2020 ------------------------------------------------------------------------------------------------------------------- Current treatment: Could not tolerate tamoxifen even half a tablet.  Anastrozole 1 mg daily (patient did not take it for more than a few days and discontinued it) starting letrozole 08/27/2021 discontinued November 2020 because of fatigue   Breast cancer surveillance: left breast mammogram 06/17/2022: Benign breast density category D I discussed with her about getting breast MRIs  periodically with such high breast density.  We will order a breast MRI for December 2023.    Abdominal pain epigastric in nature: I will refer her to Dr. Silverio Decamp for gastroenterology.  She may also need a colonoscopy because of her age.   Return to clinic in 1 year for follow-up    Orders Placed This Encounter  Procedures   MR BREAST BILATERAL [facility] [place] [facility]    Standing Status:   Future    Standing Expiration Date:   08/31/2023    Order Specific Question:   If indicated for the ordered procedure, I authorize the administration of contrast media per Radiology protocol    Answer:   Yes    Order Specific Question:   What is the patient's sedation requirement?    Answer:   No Sedation    Order Specific Question:   Does the patient have a pacemaker or implanted devices?    Answer:  No    Order Specific Question:   Preferred imaging location?    Answer:   [facility] (table limit-550lbs)   Ambulatory referral to Gastroenterology    Referral Priority:   Routine    Referral Type:   Consultation    Referral Reason:   Specialty Services Required    Referred to Provider:   Mauri Pole, MD    Number of Visits Requested:   1   The patient has a good understanding of the overall plan. she agrees with it. she will call with any problems that may develop before the next visit here. Total time spent: 30 mins including face to face time and time spent for planning, charting and co-ordination of care   Harriette Ohara, MD 08/30/22    I Gardiner Coins am scribing for Dr. Lindi Adie  I have reviewed the above documentation for accuracy and completeness, and I agree with the above.

## 2022-08-30 ENCOUNTER — Other Ambulatory Visit: Payer: Self-pay

## 2022-08-30 ENCOUNTER — Inpatient Hospital Stay: Payer: BC Managed Care – PPO | Attending: Hematology and Oncology | Admitting: Hematology and Oncology

## 2022-08-30 VITALS — BP 103/74 | HR 76 | Temp 97.7°F | Resp 16 | Ht 61.0 in | Wt 123.1 lb

## 2022-08-30 DIAGNOSIS — Z853 Personal history of malignant neoplasm of breast: Secondary | ICD-10-CM | POA: Diagnosis not present

## 2022-08-30 DIAGNOSIS — Z170 Estrogen receptor positive status [ER+]: Secondary | ICD-10-CM

## 2022-08-30 DIAGNOSIS — Z9011 Acquired absence of right breast and nipple: Secondary | ICD-10-CM | POA: Insufficient documentation

## 2022-08-30 DIAGNOSIS — Z79811 Long term (current) use of aromatase inhibitors: Secondary | ICD-10-CM | POA: Diagnosis not present

## 2022-08-30 DIAGNOSIS — C50011 Malignant neoplasm of nipple and areola, right female breast: Secondary | ICD-10-CM | POA: Diagnosis not present

## 2022-08-30 DIAGNOSIS — R1013 Epigastric pain: Secondary | ICD-10-CM | POA: Diagnosis present

## 2022-08-30 NOTE — Assessment & Plan Note (Signed)
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 Adjuvant radiation therapy because of the tumor size being large: Started 7/23/2020completed 07/11/2019 3. Followed by adjuvant antiestrogen therapy with tamoxifen 20 mg daily x10 yearsstarted 9/1/2020discontinued December 2020, started anastrozole half a tablet daily 05/27/2020 ------------------------------------------------------------------------------------------------------------------- Current treatment: Could not tolerate tamoxifen even half a tablet. Anastrozole 67m daily(patient did not take it for more than a few days and discontinued it) starting letrozole 08/27/2021  Breast cancer surveillance: left breast mammogram 06/17/2022: Benign breast density category D I discussed with her about getting breast MRIs periodically with such high breast density.  Her headaches are related to migraines.  [place]  I instructed her to talk to her primary care physician about preventive treatments for migraines.  Return to clinic in 1 year for follow-up

## 2022-09-20 ENCOUNTER — Ambulatory Visit
Admission: RE | Admit: 2022-09-20 | Discharge: 2022-09-20 | Disposition: A | Discharge status: Home / Self Care | Payer: BC Managed Care – PPO | Source: Ambulatory Visit | Attending: Hematology and Oncology | Admitting: Hematology and Oncology

## 2022-09-20 DIAGNOSIS — C50011 Malignant neoplasm of nipple and areola, right female breast: Secondary | ICD-10-CM

## 2022-09-20 MED ORDER — GADOPICLENOL 0.5 MMOL/ML IV SOLN
5.0000 mL | Freq: Once | INTRAVENOUS | Status: AC | PRN
  Administered 2022-09-20: 5 mL via INTRAVENOUS

## 2023-08-16 ENCOUNTER — Telehealth: Payer: Self-pay | Admitting: Hematology and Oncology

## 2023-08-16 NOTE — Telephone Encounter (Signed)
08/16/23; Due to a change in the provider schedule; I called the Interpreter service and asked them to call patient. The interpreter left a message that his appointment has been rescheduled. The interpreter left the new appointment date and time. I mailed a reminder notice with the new appointment details.

## 2023-08-31 ENCOUNTER — Inpatient Hospital Stay: Payer: BC Managed Care – PPO | Admitting: Hematology and Oncology

## 2023-10-04 ENCOUNTER — Inpatient Hospital Stay: Payer: BLUE CROSS/BLUE SHIELD | Admitting: Hematology and Oncology

## 2023-10-04 NOTE — Assessment & Plan Note (Deleted)
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  Adjuvant chemotherapy with dose dense Adriamycin and Cytoxan x4 followed by Taxol weekly x12 completed 05/10/2019 2.  Adjuvant radiation therapy because of the tumor size being large: Started 05/31/2019 completed 07/11/2019 3.  Followed by adjuvant antiestrogen therapy with tamoxifen 20 mg daily x10 years started 07/10/2019 discontinued December 2020, started anastrozole half a tablet daily 05/27/2020 ------------------------------------------------------------------------------------------------------------------- Current treatment: Could not tolerate tamoxifen even half a tablet.  Anastrozole 1 mg daily (patient did not take it for more than a few days and discontinued it) starting letrozole 08/27/2021 discontinued November 2020 because of fatigue   Breast cancer surveillance: left breast mammogram 06/17/2022: Benign breast density category D Breast exam 10/04/2023: Benign Breast MRI 09/21/2022: Benign density category D  I discussed with her about getting breast MRIs periodically with such high breast density.  We will order a breast MRI for December 2025.    Abdominal pain epigastric in nature: I will refer her to Dr. Lavon Paganini for gastroenterology.  She may also need a colonoscopy because of her age.   Return to clinic in 1 year for follow-up
